# Patient Record
Sex: Female | Born: 1970 | Race: Black or African American | Hispanic: No | Marital: Married | Smoking: Never smoker
Health system: Southern US, Community
[De-identification: ages and names within clinical notes are randomized; demographics above are authoritative.]

## PROBLEM LIST (undated history)

## (undated) ENCOUNTER — Ambulatory Visit: Admission: EM | Payer: BC Managed Care – PPO | Source: Home / Self Care

## (undated) DIAGNOSIS — J45909 Unspecified asthma, uncomplicated: Secondary | ICD-10-CM

## (undated) DIAGNOSIS — E559 Vitamin D deficiency, unspecified: Secondary | ICD-10-CM

## (undated) DIAGNOSIS — G4730 Sleep apnea, unspecified: Secondary | ICD-10-CM

## (undated) DIAGNOSIS — M25473 Effusion, unspecified ankle: Secondary | ICD-10-CM

## (undated) DIAGNOSIS — N3010 Interstitial cystitis (chronic) without hematuria: Secondary | ICD-10-CM

## (undated) DIAGNOSIS — Z87448 Personal history of other diseases of urinary system: Secondary | ICD-10-CM

## (undated) DIAGNOSIS — R51 Headache: Secondary | ICD-10-CM

## (undated) DIAGNOSIS — B070 Plantar wart: Secondary | ICD-10-CM

## (undated) DIAGNOSIS — E288 Other ovarian dysfunction: Secondary | ICD-10-CM

## (undated) DIAGNOSIS — R011 Cardiac murmur, unspecified: Secondary | ICD-10-CM

## (undated) DIAGNOSIS — R350 Frequency of micturition: Secondary | ICD-10-CM

## (undated) DIAGNOSIS — E785 Hyperlipidemia, unspecified: Secondary | ICD-10-CM

## (undated) DIAGNOSIS — H15009 Unspecified scleritis, unspecified eye: Secondary | ICD-10-CM

## (undated) DIAGNOSIS — K648 Other hemorrhoids: Secondary | ICD-10-CM

## (undated) DIAGNOSIS — T7840XA Allergy, unspecified, initial encounter: Secondary | ICD-10-CM

## (undated) DIAGNOSIS — M25476 Effusion, unspecified foot: Secondary | ICD-10-CM

## (undated) DIAGNOSIS — I10 Essential (primary) hypertension: Secondary | ICD-10-CM

## (undated) DIAGNOSIS — E2839 Other primary ovarian failure: Secondary | ICD-10-CM

## (undated) DIAGNOSIS — R05 Cough: Secondary | ICD-10-CM

## (undated) DIAGNOSIS — G43909 Migraine, unspecified, not intractable, without status migrainosus: Secondary | ICD-10-CM

## (undated) DIAGNOSIS — Z9189 Other specified personal risk factors, not elsewhere classified: Secondary | ICD-10-CM

## (undated) HISTORY — DX: Effusion, unspecified ankle: M25.473

## (undated) HISTORY — DX: Unspecified asthma, uncomplicated: J45.909

## (undated) HISTORY — DX: Cough: R05

## (undated) HISTORY — PX: FRACTURE SURGERY: SHX138

## (undated) HISTORY — DX: Plantar wart: B07.0

## (undated) HISTORY — DX: Other ovarian dysfunction: E28.8

## (undated) HISTORY — DX: Other primary ovarian failure: E28.39

## (undated) HISTORY — DX: Other hemorrhoids: K64.8

## (undated) HISTORY — DX: Frequency of micturition: R35.0

## (undated) HISTORY — DX: Unspecified scleritis, unspecified eye: H15.009

## (undated) HISTORY — DX: Interstitial cystitis (chronic) without hematuria: N30.10

## (undated) HISTORY — DX: Personal history of other diseases of urinary system: Z87.448

## (undated) HISTORY — DX: Vitamin D deficiency, unspecified: E55.9

## (undated) HISTORY — DX: Effusion, unspecified foot: M25.476

## (undated) HISTORY — DX: Migraine, unspecified, not intractable, without status migrainosus: G43.909

## (undated) HISTORY — DX: Cardiac murmur, unspecified: R01.1

## (undated) HISTORY — DX: Other specified personal risk factors, not elsewhere classified: Z91.89

## (undated) HISTORY — DX: Headache: R51

## (undated) HISTORY — DX: Sleep apnea, unspecified: G47.30

## (undated) HISTORY — DX: Essential (primary) hypertension: I10

## (undated) HISTORY — PX: ROTATOR CUFF REPAIR: SHX139

## (undated) HISTORY — DX: Allergy, unspecified, initial encounter: T78.40XA

## (undated) HISTORY — DX: Hyperlipidemia, unspecified: E78.5

---

## 1999-10-06 ENCOUNTER — Other Ambulatory Visit: Admission: RE | Admit: 1999-10-06 | Discharge: 1999-10-06 | Payer: Self-pay | Admitting: Obstetrics and Gynecology

## 2000-05-05 ENCOUNTER — Encounter: Payer: Self-pay | Admitting: Obstetrics and Gynecology

## 2000-05-05 ENCOUNTER — Ambulatory Visit (HOSPITAL_COMMUNITY): Admission: RE | Admit: 2000-05-05 | Discharge: 2000-05-05 | Payer: Self-pay | Admitting: Obstetrics and Gynecology

## 2001-03-16 ENCOUNTER — Other Ambulatory Visit: Admission: RE | Admit: 2001-03-16 | Discharge: 2001-03-16 | Payer: Self-pay | Admitting: Internal Medicine

## 2002-02-21 ENCOUNTER — Other Ambulatory Visit: Admission: RE | Admit: 2002-02-21 | Discharge: 2002-02-21 | Payer: Self-pay | Admitting: Internal Medicine

## 2002-03-04 ENCOUNTER — Ambulatory Visit (HOSPITAL_COMMUNITY): Admission: RE | Admit: 2002-03-04 | Discharge: 2002-03-04 | Payer: Self-pay | Admitting: Gastroenterology

## 2002-03-04 ENCOUNTER — Encounter: Payer: Self-pay | Admitting: Gastroenterology

## 2002-09-25 ENCOUNTER — Encounter: Payer: Self-pay | Admitting: Internal Medicine

## 2002-09-25 ENCOUNTER — Encounter: Admission: RE | Admit: 2002-09-25 | Discharge: 2002-09-25 | Payer: Self-pay | Admitting: Internal Medicine

## 2003-06-23 ENCOUNTER — Ambulatory Visit (HOSPITAL_COMMUNITY): Admission: RE | Admit: 2003-06-23 | Discharge: 2003-06-23 | Payer: Self-pay | Admitting: Gastroenterology

## 2003-06-23 DIAGNOSIS — K648 Other hemorrhoids: Secondary | ICD-10-CM | POA: Insufficient documentation

## 2003-06-23 HISTORY — DX: Other hemorrhoids: K64.8

## 2003-10-31 ENCOUNTER — Encounter: Admission: RE | Admit: 2003-10-31 | Discharge: 2003-10-31 | Payer: Self-pay | Admitting: Internal Medicine

## 2005-02-17 ENCOUNTER — Other Ambulatory Visit: Admission: RE | Admit: 2005-02-17 | Discharge: 2005-02-17 | Payer: Self-pay | Admitting: Obstetrics and Gynecology

## 2006-07-04 ENCOUNTER — Other Ambulatory Visit: Admission: RE | Admit: 2006-07-04 | Discharge: 2006-07-04 | Payer: Self-pay | Admitting: Obstetrics and Gynecology

## 2007-04-26 ENCOUNTER — Encounter: Admission: RE | Admit: 2007-04-26 | Discharge: 2007-04-26 | Payer: Self-pay | Admitting: Obstetrics and Gynecology

## 2007-08-06 ENCOUNTER — Other Ambulatory Visit: Admission: RE | Admit: 2007-08-06 | Discharge: 2007-08-06 | Payer: Self-pay | Admitting: Obstetrics and Gynecology

## 2008-08-21 ENCOUNTER — Other Ambulatory Visit: Admission: RE | Admit: 2008-08-21 | Discharge: 2008-08-21 | Payer: Self-pay | Admitting: Obstetrics and Gynecology

## 2008-08-21 ENCOUNTER — Encounter: Payer: Self-pay | Admitting: Obstetrics and Gynecology

## 2008-08-21 ENCOUNTER — Ambulatory Visit: Payer: Self-pay | Admitting: Obstetrics and Gynecology

## 2008-08-26 ENCOUNTER — Ambulatory Visit: Payer: Self-pay | Admitting: Obstetrics and Gynecology

## 2008-09-15 ENCOUNTER — Ambulatory Visit: Payer: Self-pay | Admitting: Obstetrics and Gynecology

## 2009-01-22 ENCOUNTER — Ambulatory Visit (HOSPITAL_BASED_OUTPATIENT_CLINIC_OR_DEPARTMENT_OTHER): Admission: RE | Admit: 2009-01-22 | Discharge: 2009-01-22 | Payer: Self-pay | Admitting: Urology

## 2009-02-19 HISTORY — PX: CYSTOSCOPY: SUR368

## 2009-06-22 ENCOUNTER — Ambulatory Visit: Payer: Self-pay | Admitting: Internal Medicine

## 2009-06-22 DIAGNOSIS — M25473 Effusion, unspecified ankle: Secondary | ICD-10-CM | POA: Insufficient documentation

## 2009-06-22 DIAGNOSIS — H15009 Unspecified scleritis, unspecified eye: Secondary | ICD-10-CM

## 2009-06-22 DIAGNOSIS — R51 Headache: Secondary | ICD-10-CM | POA: Insufficient documentation

## 2009-06-22 DIAGNOSIS — I10 Essential (primary) hypertension: Secondary | ICD-10-CM | POA: Insufficient documentation

## 2009-06-22 DIAGNOSIS — R011 Cardiac murmur, unspecified: Secondary | ICD-10-CM

## 2009-06-22 DIAGNOSIS — Z87448 Personal history of other diseases of urinary system: Secondary | ICD-10-CM

## 2009-06-22 DIAGNOSIS — M25476 Effusion, unspecified foot: Secondary | ICD-10-CM

## 2009-06-22 DIAGNOSIS — G43909 Migraine, unspecified, not intractable, without status migrainosus: Secondary | ICD-10-CM

## 2009-06-22 DIAGNOSIS — N3010 Interstitial cystitis (chronic) without hematuria: Secondary | ICD-10-CM | POA: Insufficient documentation

## 2009-06-22 DIAGNOSIS — Z9189 Other specified personal risk factors, not elsewhere classified: Secondary | ICD-10-CM

## 2009-06-22 DIAGNOSIS — E559 Vitamin D deficiency, unspecified: Secondary | ICD-10-CM | POA: Insufficient documentation

## 2009-06-22 DIAGNOSIS — G43009 Migraine without aura, not intractable, without status migrainosus: Secondary | ICD-10-CM | POA: Insufficient documentation

## 2009-06-22 DIAGNOSIS — R350 Frequency of micturition: Secondary | ICD-10-CM | POA: Insufficient documentation

## 2009-06-22 DIAGNOSIS — R519 Headache, unspecified: Secondary | ICD-10-CM | POA: Insufficient documentation

## 2009-06-22 HISTORY — DX: Essential (primary) hypertension: I10

## 2009-06-22 HISTORY — DX: Vitamin D deficiency, unspecified: E55.9

## 2009-06-22 HISTORY — DX: Unspecified scleritis, unspecified eye: H15.009

## 2009-06-22 HISTORY — DX: Effusion, unspecified foot: M25.476

## 2009-06-22 HISTORY — DX: Cardiac murmur, unspecified: R01.1

## 2009-06-22 HISTORY — DX: Other specified personal risk factors, not elsewhere classified: Z91.89

## 2009-06-22 HISTORY — DX: Migraine, unspecified, not intractable, without status migrainosus: G43.909

## 2009-06-22 HISTORY — DX: Personal history of other diseases of urinary system: Z87.448

## 2009-06-22 HISTORY — DX: Headache: R51

## 2009-06-22 HISTORY — DX: Frequency of micturition: R35.0

## 2009-06-22 HISTORY — DX: Effusion, unspecified foot: M25.473

## 2009-06-22 LAB — CONVERTED CEMR LAB
ALT: 25 units/L (ref 0–35)
AST: 26 units/L (ref 0–37)
BUN: 11 mg/dL (ref 6–23)
Basophils Absolute: 0 10*3/uL (ref 0.0–0.1)
Bilirubin Urine: NEGATIVE
Bilirubin, Direct: 0.1 mg/dL (ref 0.0–0.3)
Calcium: 8.8 mg/dL (ref 8.4–10.5)
Creatinine, Ser: 0.8 mg/dL (ref 0.4–1.2)
Eosinophils Relative: 1.7 % (ref 0.0–5.0)
GFR calc non Af Amer: 102.23 mL/min (ref >60)
Lymphocytes Relative: 38.9 % (ref 12.0–46.0)
Monocytes Relative: 9.3 % (ref 3.0–12.0)
Neutrophils Relative %: 49.6 % (ref 43.0–77.0)
Nitrite: NEGATIVE
Pap Smear: NORMAL
Platelets: 318 10*3/uL (ref 150.0–400.0)
Potassium: 3.6 meq/L (ref 3.5–5.1)
RDW: 12.5 % (ref 11.5–14.6)
Rhuematoid fact SerPl-aCnc: <20 intl units/mL (ref 0.0–20.0)
Total Bilirubin: 0.7 mg/dL (ref 0.3–1.2)
Total Protein, Urine: NEGATIVE mg/dL
Urine Glucose: NEGATIVE mg/dL
WBC: 5 10*3/uL (ref 4.5–10.5)
pH: 6 (ref 5.0–8.0)

## 2009-06-23 ENCOUNTER — Encounter (INDEPENDENT_AMBULATORY_CARE_PROVIDER_SITE_OTHER): Payer: Self-pay | Admitting: *Deleted

## 2009-06-23 ENCOUNTER — Encounter: Payer: Self-pay | Admitting: Internal Medicine

## 2009-06-23 LAB — CONVERTED CEMR LAB: Anti Nuclear Antibody(ANA): NEGATIVE

## 2009-08-03 ENCOUNTER — Ambulatory Visit: Payer: Self-pay | Admitting: Internal Medicine

## 2009-08-03 DIAGNOSIS — B070 Plantar wart: Secondary | ICD-10-CM

## 2009-08-03 HISTORY — DX: Plantar wart: B07.0

## 2009-08-04 ENCOUNTER — Telehealth: Payer: Self-pay | Admitting: Internal Medicine

## 2009-08-28 ENCOUNTER — Encounter: Payer: Self-pay | Admitting: Obstetrics and Gynecology

## 2009-08-28 ENCOUNTER — Other Ambulatory Visit: Admission: RE | Admit: 2009-08-28 | Discharge: 2009-08-28 | Payer: Self-pay | Admitting: Obstetrics and Gynecology

## 2009-08-28 ENCOUNTER — Ambulatory Visit: Payer: Self-pay | Admitting: Obstetrics and Gynecology

## 2009-09-17 ENCOUNTER — Ambulatory Visit: Payer: Self-pay | Admitting: Internal Medicine

## 2009-09-17 ENCOUNTER — Encounter: Payer: Self-pay | Admitting: Internal Medicine

## 2009-09-17 DIAGNOSIS — R059 Cough, unspecified: Secondary | ICD-10-CM

## 2009-09-17 DIAGNOSIS — R05 Cough: Secondary | ICD-10-CM

## 2009-09-17 DIAGNOSIS — J069 Acute upper respiratory infection, unspecified: Secondary | ICD-10-CM | POA: Insufficient documentation

## 2009-09-17 HISTORY — DX: Cough, unspecified: R05.9

## 2010-03-31 ENCOUNTER — Ambulatory Visit: Payer: Self-pay | Admitting: Obstetrics and Gynecology

## 2010-07-15 ENCOUNTER — Encounter: Admission: RE | Admit: 2010-07-15 | Discharge: 2010-07-15 | Payer: Self-pay | Admitting: Obstetrics and Gynecology

## 2010-07-20 ENCOUNTER — Encounter: Admission: RE | Admit: 2010-07-20 | Discharge: 2010-07-20 | Payer: Self-pay | Admitting: Obstetrics and Gynecology

## 2010-08-30 ENCOUNTER — Other Ambulatory Visit: Admission: RE | Admit: 2010-08-30 | Discharge: 2010-08-30 | Payer: Self-pay | Admitting: Obstetrics and Gynecology

## 2010-08-30 ENCOUNTER — Ambulatory Visit: Payer: Self-pay | Admitting: Obstetrics and Gynecology

## 2010-09-09 ENCOUNTER — Ambulatory Visit: Payer: Self-pay | Admitting: Obstetrics and Gynecology

## 2011-04-05 NOTE — Op Note (Signed)
NAMESIANNE, Kara Cannon             ACCOUNT NO.:  [id number]   MEDICAL RECORD NO.:  [id number]          PATIENT TYPE:  AMB   LOCATION:  [facility]                         FACILITY:  [facility]   PHYSICIAN:  Excell Seltzer. Annabell Howells, M.D.    DATE OF BIRTH:  08-12-1971   DATE OF PROCEDURE:  01/22/2009  DATE OF DISCHARGE:                               OPERATIVE REPORT   PROCEDURES:  1. Cystoscopy.  2. Bilateral retrograde pyelograms with interpretation.  3. Hydrodistention of the bladder.  4. Urethral dilation.  5. Installation of Pyridium and Marcaine.   PREOPERATIVE DIAGNOSIS:  Small capacity hypersensitive bladder with  microhematuria rule out interstitial cystitis.   POSTOPERATIVE DIAGNOSIS:  Interstitial cystitis.   SURGEON:  Dr. Bjorn Pippin.   ANESTHESIA:  General.   SPECIMEN:  None.   COMPLICATIONS:  None.   INDICATIONS:  Kara Cannon is a 40 year old African American female with a  long history of urinary frequency with severe nocturia that has failed  to respond to anticholinergics.  She has had some urgency, but no  incontinence.  Urodynamics were performed which demonstrated small  capacity hypersensitive bladder with reduced compliance and some  external sphincter dyssynergia.  She had microhematuria with a negative  cytology.  It was felt the cystoscopy, hydrodistention, bilateral  retrogrades, urethral dilation and installation of Pyridium and Marcaine  were indicated.   FINDINGS AND PROCEDURE:  The patient was taken to the operating room  where, after receiving Cipro, a general anesthetic was induced.  She was  placed in lithotomy position.  Her perineum and genitalia were prepped  with Betadine solution.  She was draped in the usual sterile fashion.  Time-out was performed.  Cystoscopy was performed using a 22-French  scope and 12 and 70-degree lenses.  Examination revealed a normal  urethra.  The bladder wall had slight increased vascularity particularly  in the trigone.  No tumors or  stones were noted.  No inflammatory  changes were noted.  There was minimal trabeculation.  Ureteral orifices  were unremarkable, effluxing clear urine.   A 5-French open-end catheter and contrast was used to perform retrograde  pyelography bilaterally.   The right retrograde pyelogram revealed a normal ureter and internal  collecting system.   The left retrograde pyelogram revealed a normal ureter and internal  collecting systems.   After completion of retrograde pyelograms, the bladder was filled to  capacity under 80 cm water pressure, held for 3 minutes and drained.  Upon draining, she had was noted at capacity of 500 mL with bloody  terminal efflux.  Repeat cystoscopy demonstrated diffuse glomerulations  consistent with a diagnosis of interstitial cystitis.   The urethra was then calibrated to 32-French with female sounds.  There  was some splitting of the mucosa at the meatus.  The bladder was then  instilled with 30 mL of 0.25% Marcaine with 400 mg of Pyridium.  She was  given a B and O suppository, taken down from lithotomy position.  Her  anesthetic was reversed.  She was moved to the recovery room in stable  condition.  There were no complications.  Excell Seltzer. Annabell Howells, M.D.  Electronically Signed     JJW/MEDQ  D:  01/22/2009  T:  01/22/2009  Job:  [id number]   cc:   Reuel Boom L. Eda Paschal, M.D.  Fax: [phone]

## 2011-04-08 NOTE — Op Note (Signed)
   Kara Cannon, Kara Cannon                       ACCOUNT NO.:  [id number]   MEDICAL RECORD NO.:  [id number]                   PATIENT TYPE:  AMB   LOCATION:  ENDO                                 FACILITY:  [facility]   PHYSICIAN:  Anselmo Rod, M.D.               DATE OF BIRTH:  04/20/1971   DATE OF PROCEDURE:  06/23/2003  DATE OF DISCHARGE:                                 OPERATIVE REPORT   PROCEDURE PERFORMED:  Colonoscopy.   ENDOSCOPIST:  Charna Elizabeth, M.D.   INSTRUMENT USED:  Olympus video colonoscope.   INDICATIONS FOR PROCEDURE:  Rectal bleeding in a 40 year old female with a  family history of ovarian cancer. Rule out colonic polyps, masses, etc.   PREPROCEDURE PREPARATION:  Informed consent was procured from the patient.  The patient was fasted for eight hours prior to the procedure and prepped  with a bottle of magnesium citrate and a gallon of GoLYTELY the night prior  to the procedure.   PREPROCEDURE PHYSICAL:  The patient had stable vital signs.  Neck supple.  Chest clear to auscultation.  S1 and S2 regular.  Abdomen soft with normal  bowel sounds.   DESCRIPTION OF PROCEDURE:  The patient was placed in left lateral decubitus  position and sedated with 70 mg of Demerol and 7 mg of Versed intravenously.  Once the patient was adequately sedated and maintained on low flow oxygen  and continuous cardiac monitoring, the Olympus video colonoscope was  advanced from the rectum to the cecum and terminal ileum without difficulty.  The appendicular orifice and ileocecal valve were clearly visualized and  photographed.  Small internal hemorrhoids were seen on retroflexion in the  rectum.  No masses, polyps, erosions, ulcerations or diverticula were  appreciated.   IMPRESSION:  Normal colonoscopy up to the terminal ileum except for small  internal hemorrhoids.   RECOMMENDATIONS:  1. A high fiber diet with liberal fluid intake has been recommended as I     suspect the rectal  bleeding is secondary to internal hemorrhoids.  2. Repeat colorectal cancer screening is recommended at the age of 20 unless     the patient develops any abnormal symptoms in the interim.  3. Outpatient follow-up as need arises in the future.                                                    Anselmo Rod, M.D.    JNM/MEDQ  D:  06/23/2003  T:  06/23/2003  Job:  [id number]   cc:   Candyce Churn. Allyne Gee, M.D.  [address]  [address]  [place]  [place] [address]  Fax: [phone]

## 2011-07-08 ENCOUNTER — Encounter: Payer: Self-pay | Admitting: Internal Medicine

## 2011-07-08 DIAGNOSIS — N3010 Interstitial cystitis (chronic) without hematuria: Secondary | ICD-10-CM

## 2011-08-01 ENCOUNTER — Other Ambulatory Visit: Payer: Self-pay | Admitting: *Deleted

## 2011-08-01 DIAGNOSIS — N6019 Diffuse cystic mastopathy of unspecified breast: Secondary | ICD-10-CM

## 2011-08-04 ENCOUNTER — Other Ambulatory Visit: Payer: Self-pay | Admitting: Obstetrics and Gynecology

## 2011-08-04 DIAGNOSIS — N6019 Diffuse cystic mastopathy of unspecified breast: Secondary | ICD-10-CM

## 2011-08-19 ENCOUNTER — Ambulatory Visit: Admission: RE | Admit: 2011-08-19 | Payer: Self-pay | Source: Ambulatory Visit

## 2011-08-19 ENCOUNTER — Ambulatory Visit
Admission: RE | Admit: 2011-08-19 | Discharge: 2011-08-19 | Disposition: A | Discharge status: Home / Self Care | Payer: BC Managed Care – PPO | Source: Ambulatory Visit | Attending: Obstetrics and Gynecology | Admitting: Obstetrics and Gynecology

## 2011-08-19 ENCOUNTER — Ambulatory Visit: Payer: Self-pay

## 2011-08-19 DIAGNOSIS — N6019 Diffuse cystic mastopathy of unspecified breast: Secondary | ICD-10-CM

## 2011-10-12 ENCOUNTER — Encounter: Payer: Self-pay | Admitting: Gynecology

## 2011-10-12 DIAGNOSIS — N3010 Interstitial cystitis (chronic) without hematuria: Secondary | ICD-10-CM | POA: Insufficient documentation

## 2011-10-12 DIAGNOSIS — E2839 Other primary ovarian failure: Secondary | ICD-10-CM | POA: Insufficient documentation

## 2011-10-24 ENCOUNTER — Encounter: Payer: Self-pay | Admitting: Obstetrics and Gynecology

## 2011-10-24 ENCOUNTER — Ambulatory Visit (INDEPENDENT_AMBULATORY_CARE_PROVIDER_SITE_OTHER): Payer: BC Managed Care – PPO | Admitting: Obstetrics and Gynecology

## 2011-10-24 ENCOUNTER — Other Ambulatory Visit (HOSPITAL_COMMUNITY)
Admission: RE | Admit: 2011-10-24 | Discharge: 2011-10-24 | Disposition: A | Discharge status: Home / Self Care | Payer: BC Managed Care – PPO | Source: Ambulatory Visit | Attending: Obstetrics and Gynecology | Admitting: Obstetrics and Gynecology

## 2011-10-24 VITALS — BP 124/76 | Ht 66.5 in | Wt 226.0 lb

## 2011-10-24 DIAGNOSIS — Z01419 Encounter for gynecological examination (general) (routine) without abnormal findings: Secondary | ICD-10-CM | POA: Insufficient documentation

## 2011-10-24 DIAGNOSIS — N390 Urinary tract infection, site not specified: Secondary | ICD-10-CM

## 2011-10-24 DIAGNOSIS — R823 Hemoglobinuria: Secondary | ICD-10-CM

## 2011-10-24 NOTE — Progress Notes (Signed)
Addended byCammie Mcgee T on: 10/24/2011 09:41 AM   Modules accepted: Orders

## 2011-10-24 NOTE — Progress Notes (Signed)
Patient came to see me today for her annual GYN exam. 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 She has night sweats but not enough to take medication. She has had 2 bone densities showing minimal bone loss. She has had no fractures. She is having no pelvic pain. She has had no vaginal bleeding. She does her lab work through her PCP. She has noticed increasing fluid retention and her lower extremitys especially her left one. She is having trouble with weight gain. She has some lesions on her right inner thigh that her dermatolgist has given her some cream which has not worked.  Physical examination:  Kennon Portela present. HEENT within normal limits. Neck: Thyroid not large. No masses. Supraclavicular nodes: not enlarged. Breasts: Examined in both sitting midline position. No skin changes and no masses. Abdomen: Soft no guarding rebound or masses or hernia. Pelvic: External: Within normal limits. BUS: Within normal limits. Vaginal:within normal limits. Good estrogen effect. No evidence of cystocele rectocele or enterocele. Cervix: clean. Uterus: Normal size and shape. Adnexa: No masses. Rectovaginal exam: Confirmatory and negative. Extremities: Within normal limits.  Skin: On right inner thigh there is a black mole and another area which is raised with dark pigmentation.  Assessment: #1. Premature ovarian failure #2. Venous insufficiency lower extremities #3. Weight gain #4. new Skin lesion  Plan: Continue yearly mammograms. Referred to Dr. Hart Rochester for veins. Discussed increasing exercise and diet for weight. Patient to check with PCP to be sure a TSH was checked. Referred back to dermatologists for reassessment of skin lesions.

## 2011-10-25 MED ORDER — NITROFURANTOIN MONOHYD MACRO 100 MG PO CAPS: 100.0000 mg | ORAL_CAPSULE | Freq: Two times a day (BID) | ORAL | Status: AC

## 2011-10-25 NOTE — Progress Notes (Signed)
Addended by: Venora Maples on: 10/25/2011 11:54 AM   Modules accepted: Orders

## 2012-09-03 ENCOUNTER — Other Ambulatory Visit: Payer: Self-pay | Admitting: *Deleted

## 2012-09-03 DIAGNOSIS — N63 Unspecified lump in unspecified breast: Secondary | ICD-10-CM

## 2012-09-05 ENCOUNTER — Ambulatory Visit
Admission: RE | Admit: 2012-09-05 | Discharge: 2012-09-05 | Disposition: A | Discharge status: Home / Self Care | Payer: BC Managed Care – PPO | Source: Ambulatory Visit | Attending: Obstetrics and Gynecology | Admitting: Obstetrics and Gynecology

## 2012-09-05 DIAGNOSIS — N63 Unspecified lump in unspecified breast: Secondary | ICD-10-CM

## 2012-11-26 ENCOUNTER — Ambulatory Visit (INDEPENDENT_AMBULATORY_CARE_PROVIDER_SITE_OTHER): Payer: BC Managed Care – PPO | Admitting: Family Medicine

## 2012-11-26 VITALS — BP 146/86 | HR 147 | Temp 103.2°F | Resp 18 | Ht 66.5 in | Wt 229.0 lb

## 2012-11-26 DIAGNOSIS — J392 Other diseases of pharynx: Secondary | ICD-10-CM

## 2012-11-26 DIAGNOSIS — J111 Influenza due to unidentified influenza virus with other respiratory manifestations: Secondary | ICD-10-CM

## 2012-11-26 DIAGNOSIS — J101 Influenza due to other identified influenza virus with other respiratory manifestations: Secondary | ICD-10-CM

## 2012-11-26 MED ORDER — OSELTAMIVIR PHOSPHATE 75 MG PO CAPS: 75.0000 mg | ORAL_CAPSULE | Freq: Two times a day (BID) | ORAL | Status: DC

## 2012-11-26 MED ORDER — HYDROCODONE-HOMATROPINE 5-1.5 MG/5ML PO SYRP: 5.0000 mL | ORAL_SOLUTION | ORAL | Status: DC | PRN

## 2012-11-26 MED ORDER — ACETAMINOPHEN 325 MG PO TABS
500.0000 mg | ORAL_TABLET | Freq: Four times a day (QID) | ORAL | Status: DC | PRN
  Administered 2012-11-26: 1000 mg via ORAL

## 2012-11-26 NOTE — Patient Instructions (Signed)

## 2012-11-26 NOTE — Progress Notes (Signed)
Subjective: 42 year old lady who's husband was diagnosed with influenza yesterday. She started feeling achy and a headache yesterday. During the night she spiked a high fever to 103.5 and felt lousy. She is coughing. Mild sneezing and runny nose. Fever despite taking Tylenol. Her throat is sore.  Objective: Her TMs are normal. Throat is a little red has a ulceration on the right tonsillar area. Neck was supple without significant nodes. Chest is clear to auscultation. Heart is tachycardic with a soft flow murmur.  Assessment: Influenza Baral also right tonsil  Plan: Fluids, rest, Tamiflu, cough medications, ibuprofen or Tylenol

## 2013-03-22 ENCOUNTER — Ambulatory Visit (INDEPENDENT_AMBULATORY_CARE_PROVIDER_SITE_OTHER): Payer: BC Managed Care – PPO | Admitting: Internal Medicine

## 2013-03-22 ENCOUNTER — Encounter: Payer: Self-pay | Admitting: Internal Medicine

## 2013-03-22 VITALS — BP 162/118 | HR 80 | Temp 98.2°F | Ht 66.25 in | Wt 227.0 lb

## 2013-03-22 DIAGNOSIS — I10 Essential (primary) hypertension: Secondary | ICD-10-CM

## 2013-03-22 DIAGNOSIS — F411 Generalized anxiety disorder: Secondary | ICD-10-CM | POA: Insufficient documentation

## 2013-03-22 DIAGNOSIS — E2839 Other primary ovarian failure: Secondary | ICD-10-CM

## 2013-03-22 DIAGNOSIS — F430 Acute stress reaction: Secondary | ICD-10-CM | POA: Insufficient documentation

## 2013-03-22 DIAGNOSIS — E288 Other ovarian dysfunction: Secondary | ICD-10-CM

## 2013-03-22 MED ORDER — HYDROCHLOROTHIAZIDE 12.5 MG PO TABS: 12.5000 mg | ORAL_TABLET | Freq: Every day | ORAL | Status: DC

## 2013-03-22 MED ORDER — POTASSIUM CHLORIDE ER 10 MEQ PO TBCR: 10.0000 meq | EXTENDED_RELEASE_TABLET | Freq: Two times a day (BID) | ORAL | Status: DC

## 2013-03-22 NOTE — Assessment & Plan Note (Signed)
Patient reports difficulty with stressful work situation.  She denies any previous history of anxiety or depression. We discussed stress management techniques. If worsening symptoms, consider use of antidepressants or anxiolytics.

## 2013-03-22 NOTE — Assessment & Plan Note (Signed)
Patient advised to discontinue over-the-counter weight-loss supplement as it may be exacerbating her blood pressure. She complains of fluid retention. Start hydrochlorothiazide 12.5 mg once daily. Patient advised to increase her intake of high potassium foods. Patient also encouraged to limit her sodium intake. BP: 162/118 mmHg

## 2013-03-22 NOTE — Progress Notes (Signed)
Subjective:    Patient ID: Kara Cannon, female    DOB: 1970-12-06, 42 y.o.   MRN: [id number]  HPI  42 year old [place] American female with history of hypertension, hyperlipidemia and interstitial status to establish.  Patient states her blood pressure has been labile and she has not taken antihypertensives in a regular basis. Over the last several months she has noticed elevated blood pressure readings. Patient also concerned about inability to lose weight and fluid retention. She has been taking a over-the-counter weight loss supplement - Garcinia Cambogia.  She has started a weight loss parameters. She is enrolled in Museum/gallery curator". She has been enrolled for last 6 weeks. She is also altered her diet. She's lost approximately 5 pounds within the last month.  She has history of premature ovarian failure.  She is not on HRT.  She also reports possible history of hypothyroidism. She was on thyroid replacement but stopped because it caused weight gain. It has been greater than one year since her thyroid blood test. Review of Systems  Constitutional: Negative for activity change, appetite change and unexpected weight change.  Eyes: Negative for visual disturbance.  Respiratory: Negative for cough, chest tightness and shortness of breath.   Cardiovascular: Negative for chest pain.  Genitourinary: Negative for difficulty urinating.  Neurological: Negative for headaches.  Gastrointestinal: Negative for abdominal pain, heartburn melena or hematochezia Psych: stressed at work,  She feels stress contributing to weight problem Endo:  No polyuria or polydypsia        Past Medical History  Diagnosis Date  . Effusion of ankle and foot joint 06/22/2009  . CHICKENPOX, HX OF 06/22/2009  . Cough 09/17/2009  . EPISCLERITIS 06/22/2009  . Headache 06/22/2009  . HEMORRHOIDS, INTERNAL 06/23/2003  . HYPERTENSION 06/22/2009  . MIGRAINE HEADACHE 06/22/2009  . PLANTAR WART, LEFT 08/03/2009  . Unspecified vitamin D  deficiency 06/22/2009  . Urinary frequency 06/22/2009  . UTI'S, HX OF 06/22/2009  . Premature ovarian failure   . IC (interstitial cystitis)   . CARDIAC MURMUR 06/22/2009    Mitral regurgitation    History   Social History  . Marital Status: Married    Spouse Name: N/A    Number of Children: N/A  . Years of Education: N/A   Occupational History  . New Business Associate     [employer]   Social History Main Topics  . Smoking status: Never Smoker   . Smokeless tobacco: Not on file     Comment: Married  . Alcohol Use: No  . Drug Use: No  . Sexually Active: Yes    Birth Control/ Protection: Post-menopausal   Other Topics Concern  . Not on file   Social History Narrative  . No narrative on file    Past Surgical History  Procedure Laterality Date  . Cystoscopy  02/2009    Dr. Annabell Howells    Family History  Problem Relation Age of Onset  . Skin cancer Maternal Uncle   . Hypertension Mother   . Hypertension Father   . Ovarian cancer Paternal Grandmother   . Cancer Paternal Grandmother   . Diabetes Maternal Grandmother   . Hyperlipidemia Maternal Grandmother     Allergies  Allergen Reactions  . Corn-Containing Products   . Ibuprofen   . Naproxen Sodium   . Peanut-Containing Drug Products   . Penicillins   . Sulfonamide Derivatives     Current Outpatient Prescriptions on File Prior to Visit  Medication Sig Dispense Refill  . Ascorbic Acid (VITAMIN  C PO) Take by mouth.        Marland Kitchen BIOTIN PO Take by mouth.        . Cholecalciferol (VITAMIN D) 2000 UNITS CAPS Take by mouth daily.        . Cyanocobalamin (VITAMIN B-12 PO) Take by mouth.        . Multiple Vitamin (MULTIVITAMIN) tablet Take 1 tablet by mouth daily.         No current facility-administered medications on file prior to visit.    BP 162/118  Pulse 80  Temp(Src) 98.2 F (36.8 C) (Oral)  Ht 5' 6.25" (1.683 m)  Wt 227 lb (102.967 kg)  BMI 36.35 kg/m2    Objective:   Physical Exam  Constitutional:  She is oriented to person, place, and time. She appears well-developed and well-nourished. No distress.  HENT:  Head: Normocephalic and atraumatic.  Right Ear: External ear normal.  Left Ear: External ear normal.  Eyes: Conjunctivae and EOM are normal. Pupils are equal, round, and reactive to light.  Neck: Neck supple.  Negative for carotid bruit  Cardiovascular: Normal rate, regular rhythm and normal heart sounds.   No murmur heard. Pulmonary/Chest: Effort normal and breath sounds normal. She has no wheezes.  Abdominal: Soft. Bowel sounds are normal. She exhibits no mass. There is no tenderness.  Musculoskeletal:  Trace lower extremity edema bilaterally  Lymphadenopathy:    She has no cervical adenopathy.  Neurological: She is alert and oriented to person, place, and time. She displays normal reflexes. No cranial nerve deficit. She exhibits normal muscle tone.  Muscle strength is 5 out of 5 throughout  Skin: Skin is warm and dry.  Psychiatric: She has a normal mood and affect. Her behavior is normal.          Assessment & Plan:

## 2013-03-22 NOTE — Assessment & Plan Note (Signed)
Check estradiol level, FSH and LH

## 2013-03-22 NOTE — Patient Instructions (Addendum)
Please complete the following lab tests before your next follow up appointment: BMET, CBCD, FLP, LFTs - 401.9, 272.4 TSH, Free T4 - 244.9 Estradiol levels, FSH, LH - 256.8 A1c - 790.29

## 2013-03-28 ENCOUNTER — Telehealth: Payer: Self-pay | Admitting: Internal Medicine

## 2013-03-28 NOTE — Telephone Encounter (Signed)
Ok to add blood test

## 2013-03-28 NOTE — Telephone Encounter (Signed)
Lab added

## 2013-03-28 NOTE — Telephone Encounter (Signed)
Pt is sch for blood work tomorrow and would like to add HS-CRP test to order

## 2013-03-29 ENCOUNTER — Other Ambulatory Visit (INDEPENDENT_AMBULATORY_CARE_PROVIDER_SITE_OTHER): Payer: BC Managed Care – PPO

## 2013-03-29 DIAGNOSIS — E288 Other ovarian dysfunction: Secondary | ICD-10-CM

## 2013-03-29 DIAGNOSIS — E039 Hypothyroidism, unspecified: Secondary | ICD-10-CM

## 2013-03-29 DIAGNOSIS — I10 Essential (primary) hypertension: Secondary | ICD-10-CM

## 2013-03-29 DIAGNOSIS — E785 Hyperlipidemia, unspecified: Secondary | ICD-10-CM

## 2013-03-29 LAB — CBC WITH DIFFERENTIAL/PLATELET
Basophils Absolute: 0 10*3/uL (ref 0.0–0.1)
Eosinophils Absolute: 0.1 10*3/uL (ref 0.0–0.7)
Hemoglobin: 13.2 g/dL (ref 12.0–15.0)
Lymphocytes Relative: 37.3 % (ref 12.0–46.0)
Lymphs Abs: 2.2 10*3/uL (ref 0.7–4.0)
MCHC: 34.1 g/dL (ref 30.0–36.0)
Neutro Abs: 3 10*3/uL (ref 1.4–7.7)
Platelets: 361 10*3/uL (ref 150.0–400.0)
RDW: 14.8 % — ABNORMAL HIGH (ref 11.5–14.6)

## 2013-03-29 LAB — BASIC METABOLIC PANEL
BUN: 14 mg/dL (ref 6–23)
CO2: 26 mEq/L (ref 19–32)
Calcium: 9.3 mg/dL (ref 8.4–10.5)
GFR: 94.88 mL/min (ref >60.00)
Glucose, Bld: 90 mg/dL (ref 70–99)
Sodium: 138 mEq/L (ref 135–145)

## 2013-03-29 LAB — HEPATIC FUNCTION PANEL
Albumin: 4.1 g/dL (ref 3.5–5.2)
Total Bilirubin: 0.7 mg/dL (ref 0.3–1.2)

## 2013-03-29 LAB — LIPID PANEL
HDL: 46.3 mg/dL (ref >39.00)
Triglycerides: 115 mg/dL (ref 0.0–149.0)

## 2013-03-29 LAB — FOLLICLE STIMULATING HORMONE: FSH: 54.2 m[IU]/mL

## 2013-03-29 LAB — T4, FREE: Free T4: 0.95 ng/dL (ref 0.60–1.60)

## 2013-03-29 LAB — HEMOGLOBIN A1C: Hgb A1c MFr Bld: 6.3 % (ref 4.6–6.5)

## 2013-03-29 LAB — LUTEINIZING HORMONE: LH: 26.86 m[IU]/mL

## 2013-03-30 LAB — ESTRADIOL: Estradiol: 26.6 pg/mL

## 2013-04-05 ENCOUNTER — Encounter: Payer: Self-pay | Admitting: Internal Medicine

## 2013-04-05 ENCOUNTER — Ambulatory Visit (INDEPENDENT_AMBULATORY_CARE_PROVIDER_SITE_OTHER): Payer: BC Managed Care – PPO | Admitting: Internal Medicine

## 2013-04-05 VITALS — BP 130/84 | HR 80 | Temp 98.4°F | Wt 221.0 lb

## 2013-04-05 DIAGNOSIS — R7309 Other abnormal glucose: Secondary | ICD-10-CM | POA: Insufficient documentation

## 2013-04-05 DIAGNOSIS — I10 Essential (primary) hypertension: Secondary | ICD-10-CM

## 2013-04-05 DIAGNOSIS — E785 Hyperlipidemia, unspecified: Secondary | ICD-10-CM

## 2013-04-05 DIAGNOSIS — E782 Mixed hyperlipidemia: Secondary | ICD-10-CM | POA: Insufficient documentation

## 2013-04-05 MED ORDER — PRAVASTATIN SODIUM 20 MG PO TABS: 20.0000 mg | ORAL_TABLET | Freq: Every day | ORAL | Status: DC

## 2013-04-05 MED ORDER — HYDROCHLOROTHIAZIDE 12.5 MG PO TABS: 12.5000 mg | ORAL_TABLET | Freq: Every day | ORAL | Status: DC

## 2013-04-05 MED ORDER — CLOTRIMAZOLE-BETAMETHASONE 1-0.05 % EX CREA: TOPICAL_CREAM | Freq: Two times a day (BID) | CUTANEOUS | Status: DC

## 2013-04-05 NOTE — Assessment & Plan Note (Signed)
Good response to hydrochlorothiazide. Continue same dose. BP: 130/84 mmHg  Lab Results  Component Value Date   CREATININE 0.8 03/29/2013   Lab Results  Component Value Date   NA 138 03/29/2013   K 3.9 03/29/2013   CL 102 03/29/2013   CO2 26 03/29/2013

## 2013-04-05 NOTE — Patient Instructions (Addendum)
Limit your carbohydrate intake to 30 grams per meal ( 90 - 100 grams per day ) Please complete the following lab tests before your next follow up appointment: BMET, A1c - 790.29 FLP, LFTs - 272.4

## 2013-04-05 NOTE — Assessment & Plan Note (Signed)
Patient is prediabetic. I stressed importance of life style/dietary changes. Patient to limit her carbohydrate intake to 30 g per meal. Continue regular exercise. Educational material provided.

## 2013-04-05 NOTE — Progress Notes (Signed)
Subjective:    Patient ID: Kara Cannon, female    DOB: 1971/02/02, 41 y.o.   MRN: [id number]  HPI  42 year old [place] American female previously seen for hypertension, hyperlipidemia and premature in failure for followup. She was started on hydrochlorothiazide. She is tolerating this well. Her blood pressure has improved.  We reviewed her blood work in detail. She is borderline diabetic. We reviewed her current diet.  Estradiol, LH, FSH consistent with postmenopausal state.  Her CRP is elevated.  Review of Systems Negative for chest pain or shortness of breath    Past Medical History  Diagnosis Date  . Effusion of ankle and foot joint 06/22/2009  . CHICKENPOX, HX OF 06/22/2009  . Cough 09/17/2009  . EPISCLERITIS 06/22/2009  . Headache 06/22/2009  . HEMORRHOIDS, INTERNAL 06/23/2003  . HYPERTENSION 06/22/2009  . MIGRAINE HEADACHE 06/22/2009  . PLANTAR WART, LEFT 08/03/2009  . Unspecified vitamin D deficiency 06/22/2009  . Urinary frequency 06/22/2009  . UTI'S, HX OF 06/22/2009  . Premature ovarian failure   . IC (interstitial cystitis)   . CARDIAC MURMUR 06/22/2009    Mitral regurgitation    History   Social History  . Marital Status: Married    Spouse Name: N/A    Number of Children: N/A  . Years of Education: N/A   Occupational History  . New Business Associate     [employer]   Social History Main Topics  . Smoking status: Never Smoker   . Smokeless tobacco: Not on file     Comment: Married  . Alcohol Use: No  . Drug Use: No  . Sexually Active: Yes    Birth Control/ Protection: Post-menopausal   Other Topics Concern  . Not on file   Social History Narrative  . No narrative on file    Past Surgical History  Procedure Laterality Date  . Cystoscopy  02/2009    Dr. Annabell Howells    Family History  Problem Relation Age of Onset  . Skin cancer Maternal Uncle   . Hypertension Mother   . Hypertension Father   . Ovarian cancer Paternal Grandmother   . Cancer Paternal  Grandmother   . Diabetes Maternal Grandmother   . Hyperlipidemia Maternal Grandmother     Allergies  Allergen Reactions  . Corn-Containing Products   . Ibuprofen   . Naproxen Sodium   . Peanut-Containing Drug Products   . Penicillins   . Sulfonamide Derivatives     Current Outpatient Prescriptions on File Prior to Visit  Medication Sig Dispense Refill  . Ascorbic Acid (VITAMIN C PO) Take by mouth.        Marland Kitchen aspirin-acetaminophen-caffeine (EXCEDRIN MIGRAINE) 250-250-65 MG per tablet Take 1 tablet by mouth every 6 (six) hours as needed for pain.      Marland Kitchen b complex vitamins tablet Take 1 tablet by mouth daily.      Marland Kitchen BIOTIN PO Take by mouth.        . Cholecalciferol (VITAMIN D) 2000 UNITS CAPS Take by mouth daily.        . Cyanocobalamin (VITAMIN B-12 PO) Take by mouth.        . Multiple Vitamin (MULTIVITAMIN) tablet Take 1 tablet by mouth daily.        . potassium chloride (K-DUR) 10 MEQ tablet Take 1 tablet (10 mEq total) by mouth 2 (two) times daily.  30 tablet  1  . TURMERIC CURCUMIN PO Take 1 tablet by mouth daily.       No current  facility-administered medications on file prior to visit.    BP 130/84  Pulse 80  Temp(Src) 98.4 F (36.9 C) (Oral)  Wt 221 lb (100.245 kg)  BMI 35.39 kg/m2    Objective:   Physical Exam  Constitutional: She is oriented to person, place, and time. She appears well-developed and well-nourished.  Cardiovascular: Normal rate, regular rhythm and normal heart sounds.   Pulmonary/Chest: Effort normal and breath sounds normal. She has no wheezes.  Neurological: She is alert and oriented to person, place, and time.  Skin:  Dry patch right upper mid thigh          Assessment & Plan:

## 2013-04-05 NOTE — Assessment & Plan Note (Signed)
Start pravastatin 20 once daily considering hypertension and prediabetes. LFTs and fasting lipid panel before next office visit. Lab Results  Component Value Date   CHOL 225* 03/29/2013   HDL 46.30 03/29/2013   LDLDIRECT 159.7 03/29/2013   TRIG 115.0 03/29/2013   CHOLHDL 5 03/29/2013

## 2013-04-08 ENCOUNTER — Telehealth: Payer: Self-pay | Admitting: Internal Medicine

## 2013-04-08 DIAGNOSIS — R7303 Prediabetes: Secondary | ICD-10-CM

## 2013-04-08 NOTE — Telephone Encounter (Signed)
Referral order placed.

## 2013-04-08 NOTE — Telephone Encounter (Signed)
Ok for referral to nutritionist re: prediabetes.

## 2013-04-08 NOTE — Telephone Encounter (Signed)
PT called to request information about a nutritionist. She would like to also be referred to one, please assist.

## 2013-04-17 ENCOUNTER — Encounter: Payer: BC Managed Care – PPO | Attending: Internal Medicine | Admitting: *Deleted

## 2013-04-17 ENCOUNTER — Encounter: Payer: Self-pay | Admitting: *Deleted

## 2013-04-17 DIAGNOSIS — E785 Hyperlipidemia, unspecified: Secondary | ICD-10-CM

## 2013-04-17 DIAGNOSIS — E669 Obesity, unspecified: Secondary | ICD-10-CM | POA: Insufficient documentation

## 2013-04-17 DIAGNOSIS — I10 Essential (primary) hypertension: Secondary | ICD-10-CM

## 2013-04-17 DIAGNOSIS — R7309 Other abnormal glucose: Secondary | ICD-10-CM

## 2013-04-17 DIAGNOSIS — Z713 Dietary counseling and surveillance: Secondary | ICD-10-CM | POA: Insufficient documentation

## 2013-04-17 NOTE — Progress Notes (Signed)
  Medical Nutrition Therapy:  Appt start time: 1130 end time:  1230.  Assessment:  Primary concerns today: Kara Cannon is here for nutrition counseling pertaining to obesity and prediabetes.  She complains of fluid retention for 10-15 years. She has tried to lose weight in the past vie dieting and walking.  Also has elliptical machine, but she doesn't do it often.  Walking was successful.  She is currently the heaviest she's been.  She has gained weight over the years after taking Depo.  She feels like she's gained more weight recently from more swelling.  She holds a lot of fluid and stopped taking diuretic Started 10 day juice cleanse.  She is also participating in a boot camp program: [employer] and he's prescribed a meal plan that is very restrictive. (no carbohydrates except sweet potatoes, no beverages except water, no proteins except ultra lean...) She skips breakfast and lunch on the weekends.  She might skip dinner on week days.    MEDICATIONS: see list   DIETARY INTAKE:  Usual eating pattern includes 1-3 meals and 1-3 snacks per day.  24-hr recall:  B ( AM): usually 2 boiled egg whites with 1 [place] sausage and 2 slices bacon  Snk ( AM): oats and honey granola bar.    L ( PM): chicken salad with vegetable crackers Snk ( PM): maybe jalapeno flavored chips D ( PM): spaghetti with wwheat noodles and [place] meatball and corn Snk ( PM): maybe some more chips Beverages: 64 oz water with sugar-free flavoring.  Sweet tea (16-20 oz), detox tea  Usual physical activity: boot camp 3 days/week for 1 hour then walk 30+ afterwards; might exercise on off days  Estimated energy needs: 1600 calories 180 g carbohydrates 120 g protein 44 g fat  Progress Towards Goal(s):  In progress.   Nutritional Diagnosis:  NB-1.1 Food and nutrition-related knowledge deficit As related to proper balance of fats, carbohydrates, and proteins.  As evidenced by obesity, HTN, hyperlipidemia, and  hyperglycemia.    Intervention:  Nutrition counseling provided.  Discussed metabolic effects of meal skipping and discouraged this practice.  Also discussed ineffectiveness of juices/cleanses/shakes.  Encouraged her to take nutrition advice only from certified nutrition professionals, not what she reads online or hears from her personal trainer's wife.  Discussed healthy options for breakfast, lunch, and dinner, as well as snacks.  Discouraged intense exercise programs that are not enjoyable.  Suggested body movement that is sustainable and enjoyable like walking, dancing, gardening, etc.  Discouraged overly strict diets that exclude too many foods/beverages.  Suggested sustainable life changes.   Reviewed lab data.  Suggested talking with MD about any questions she might have.   Monitoring/Evaluation:  Dietary intake, exercise, and body weight in a few week(s).

## 2013-04-17 NOTE — Patient Instructions (Addendum)
Aim for 3 meals a day.  Avoid meal skipping.  Breakfast: cereal, scrambled eggs, toast, bagel thins with cinnamon butter spread thinly.  Maybe fruit Try [place] sausage Morning snack: granola bar, fruit, string cheese Lunch: salad with protein, sandwiches, soup, (lean protein, whole grain carbohydrate, and fruit or vegetable with small amount of heart healthy fat- olive oil, nuts, avocado) Snack: whole wheat crackers, fruit, raw veggies Dinner: lean protein, whole grain carbohydrate, vegetable, or fruit Snack: fruit, sherbert, frozen yogurt.

## 2013-04-24 ENCOUNTER — Ambulatory Visit (INDEPENDENT_AMBULATORY_CARE_PROVIDER_SITE_OTHER): Payer: BC Managed Care – PPO | Admitting: Physician Assistant

## 2013-04-24 VITALS — BP 130/90 | HR 96 | Temp 98.0°F | Resp 16 | Ht 66.0 in | Wt 218.0 lb

## 2013-04-24 DIAGNOSIS — R350 Frequency of micturition: Secondary | ICD-10-CM

## 2013-04-24 DIAGNOSIS — R300 Dysuria: Secondary | ICD-10-CM

## 2013-04-24 LAB — POCT URINALYSIS DIPSTICK
Bilirubin, UA: NEGATIVE
Glucose, UA: NEGATIVE
Leukocytes, UA: NEGATIVE
Nitrite, UA: NEGATIVE

## 2013-04-24 LAB — POCT UA - MICROSCOPIC ONLY
Bacteria, U Microscopic: NEGATIVE
Yeast, UA: NEGATIVE

## 2013-04-24 MED ORDER — CIPROFLOXACIN HCL 500 MG PO TABS: 500.0000 mg | ORAL_TABLET | Freq: Two times a day (BID) | ORAL | Status: DC

## 2013-04-24 NOTE — Progress Notes (Signed)
  Subjective:    Patient ID: Kara Cannon, female    DOB: [birth year], 42 y.o.   MRN: [id number]  HPI 42 year old female presents with 1 week history of urinary frequency, suprapubic pressure, and slight vaginal irritation.  States she does have a history of UTI's with the last about 1 year ago. Does not have dysuria and has not noticed any hematuria.  Slight clear, thin vaginal discharge but states this is typical when she gets UTI's.  Denies fever, chills, nausea, vomiting.    Patient also requesting "something for anxiety" - has seen her PCP about this who instructed her on conservative measures. States this is not working and she has had increase stress at work and with her husband. Admits to difficulty sleeping despite trial with melatonin and benadryl.      Review of Systems  Constitutional: Negative for fever and chills.  Gastrointestinal: Positive for nausea and abdominal pain (suprapubic tenderness). Negative for vomiting.  Genitourinary: Positive for frequency and vaginal discharge. Negative for dysuria, hematuria and vaginal bleeding.  Skin: Negative for rash.  Neurological: Negative for dizziness and headaches.       Objective:   Physical Exam  Constitutional: She is oriented to person, place, and time. She appears well-developed and well-nourished.  HENT:  Head: Normocephalic and atraumatic.  Right Ear: External ear normal.  Left Ear: External ear normal.  Eyes: Conjunctivae are normal.  Neck: Normal range of motion.  Cardiovascular: Normal rate, regular rhythm and normal heart sounds.   Pulmonary/Chest: Effort normal and breath sounds normal.  Abdominal: Soft. Bowel sounds are normal. There is tenderness (suprapubic). There is no rebound, no guarding and no CVA tenderness.  Neurological: She is alert and oriented to person, place, and time.  Psychiatric: She has a normal mood and affect. Her behavior is normal. Judgment and thought content normal.      Results for  orders placed in visit on 04/24/13  POCT UA - MICROSCOPIC ONLY      Result Value Range   WBC, Ur, HPF, POC 0-2     RBC, urine, microscopic neg     Bacteria, U Microscopic neg     Mucus, UA moderate     Epithelial cells, urine per micros 5-12     Crystals, Ur, HPF, POC neg     Casts, Ur, LPF, POC neg     Yeast, UA neg    POCT URINALYSIS DIPSTICK      Result Value Range   Color, UA yellow     Clarity, UA clear     Glucose, UA neg     Bilirubin, UA neg     Ketones, UA trace     Spec Grav, UA 1.025     Blood, UA moderate     pH, UA 5.0     Protein, UA neg     Urobilinogen, UA 0.2     Nitrite, UA neg     Leukocytes, UA Negative         Assessment & Plan:   Dysuria - Plan: POCT UA - Microscopic Only, POCT urinalysis dipstick, Urine culture, ciprofloxacin (CIPRO) 500 MG tablet  Urinary frequency  Will go ahead and treat with Cipro bid x 5 days Urine culture sent Increase fluids and rest Recommend follow up with PCP for further evaluation and treatment of anxiety Follow up here if symptoms worsen or fail to improve.

## 2013-04-26 LAB — URINE CULTURE: Colony Count: 9000

## 2013-05-15 ENCOUNTER — Other Ambulatory Visit: Payer: Self-pay | Admitting: *Deleted

## 2013-05-15 MED ORDER — HYDROCHLOROTHIAZIDE 12.5 MG PO TABS: 12.5000 mg | ORAL_TABLET | Freq: Every day | ORAL | Status: DC

## 2013-05-15 MED ORDER — POTASSIUM CHLORIDE ER 10 MEQ PO TBCR: 10.0000 meq | EXTENDED_RELEASE_TABLET | Freq: Two times a day (BID) | ORAL | Status: DC

## 2013-05-20 ENCOUNTER — Encounter: Payer: Self-pay | Admitting: *Deleted

## 2013-05-20 ENCOUNTER — Encounter: Payer: BC Managed Care – PPO | Attending: Internal Medicine | Admitting: *Deleted

## 2013-05-20 VITALS — Ht 66.5 in | Wt 220.3 lb

## 2013-05-20 DIAGNOSIS — R7309 Other abnormal glucose: Secondary | ICD-10-CM | POA: Insufficient documentation

## 2013-05-20 DIAGNOSIS — Z713 Dietary counseling and surveillance: Secondary | ICD-10-CM | POA: Insufficient documentation

## 2013-05-20 DIAGNOSIS — E669 Obesity, unspecified: Secondary | ICD-10-CM | POA: Insufficient documentation

## 2013-05-20 NOTE — Progress Notes (Signed)
  Medical Nutrition Therapy:  Appt start time: 1030 end time:  1100.  Assessment:  Primary concerns today: Kara Cannon is here for follow up nutrition counseling pertaining to obesity and prediabetes.  She has made some changes since last visit: she stopped the boot camp class and the juice cleanse.  She also stopped the restrictive diet the personal trainer prescribed.  She has been eating more balanced meals, but not been as physically active.  She drinks a lot of sweet tea and feels guilty about the food choices she make.   MEDICATIONS: see list   DIETARY INTAKE:  Usual eating pattern includes 1-3 meals and 1-3 snacks per day.  24-hr recall:  B ( AM): 2 egg white with 2 pork or [place] bacon or smoked sausage with water sweet tea.  Sometimes hot tea.  Sometimes detox tea Snk ( AM): sometimes, but not really   L ( PM): sometimes salad or leftovers (baked chicken and rice) [place] gray and rice with broccoli or spinach.  Might get something from cafeteria: [place] and cheese on wheat with chips.  Might get panino.  Won't eat it all.  Gets water, lemonade, or sweet tea Snk ( PM): chocolate chip cookie D ( PM): pasta or lo mein and broccoli; sometimes chicken wings; sometimes salad.  Might go out 3 times: olive garden or chili's or [place].  Sweet tea Snk ( PM): jalapeno chips or maybe chocolate chip cookie.   Beverages:sweet tea, detox tea  Usual physical activity: hasn't been exercising as much lately.  Quit boot camp and has been walking some.   Plans to start walking and going to [employer].    Estimated energy needs: 1600 calories 180 g carbohydrates 120 g protein 44 g fat  Progress Towards Goal(s):  In progress.   Nutritional Diagnosis:  NB-1.1 Food and nutrition-related knowledge deficit As related to proper balance of fats, carbohydrates, and proteins.  As evidenced by obesity, HTN, hyperlipidemia, and hyperglycemia.    Intervention:  Nutrition counseling provided.  Aim for 3  meals/day - avoid meal skipping Limit sugary beverages to 1/day- drink more water Relax in the evenings and get to bed earlier- watch tv or read.  Let thoughts pass.  Do not get out of bed!!! Aim for pleasureable body movement- walk, zumba, dance 7 minute workout Limit instant foods like mircowave meals to reduce sodium consumption Eat without distractions: turn tv off and eat slowly.  Stop before getting stuffed Talk with MD if sleep doesn't improve  Lunch: leftovers or salad, sandwiches, soup; pb and j; bagel pizza  Monitoring/Evaluation:  Dietary intake, exercise, and body weight in 3 month (s).

## 2013-05-20 NOTE — Patient Instructions (Addendum)
Aim for 3 meals/day - avoid meal skipping Limit sugary beverages to 1/day- drink more water Relax in the evenings and get to bed earlier- watch tv or read.  Let thoughts pass.  Do not get out of bed!!! Aim for pleasureable body movement- walk, zumba, dance 7 minute workout Talk with MD if sleep doesn't improve   Lunch: leftovers or salad, sandwiches, soup; pb and j; bagel pizza

## 2013-06-28 ENCOUNTER — Other Ambulatory Visit (INDEPENDENT_AMBULATORY_CARE_PROVIDER_SITE_OTHER): Payer: BC Managed Care – PPO

## 2013-06-28 DIAGNOSIS — R7309 Other abnormal glucose: Secondary | ICD-10-CM

## 2013-06-28 DIAGNOSIS — E785 Hyperlipidemia, unspecified: Secondary | ICD-10-CM

## 2013-06-28 LAB — BASIC METABOLIC PANEL
Calcium: 8.9 mg/dL (ref 8.4–10.5)
GFR: 111.43 mL/min (ref >60.00)
Sodium: 141 mEq/L (ref 135–145)

## 2013-06-28 LAB — HEPATIC FUNCTION PANEL
ALT: 20 U/L (ref 0–35)
Albumin: 3.3 g/dL — ABNORMAL LOW (ref 3.5–5.2)
Alkaline Phosphatase: 65 U/L (ref 39–117)
Total Protein: 6.7 g/dL (ref 6.0–8.3)

## 2013-06-28 LAB — LIPID PANEL
Cholesterol: 169 mg/dL (ref 0–200)
HDL: 47.2 mg/dL (ref >39.00)
Triglycerides: 141 mg/dL (ref 0.0–149.0)

## 2013-07-05 ENCOUNTER — Ambulatory Visit (INDEPENDENT_AMBULATORY_CARE_PROVIDER_SITE_OTHER): Payer: BC Managed Care – PPO | Admitting: Internal Medicine

## 2013-07-05 VITALS — BP 126/80 | Temp 98.3°F | Wt 224.0 lb

## 2013-07-05 DIAGNOSIS — E785 Hyperlipidemia, unspecified: Secondary | ICD-10-CM

## 2013-07-05 DIAGNOSIS — M62830 Muscle spasm of back: Secondary | ICD-10-CM | POA: Insufficient documentation

## 2013-07-05 DIAGNOSIS — R7309 Other abnormal glucose: Secondary | ICD-10-CM

## 2013-07-05 DIAGNOSIS — M62838 Other muscle spasm: Secondary | ICD-10-CM

## 2013-07-05 MED ORDER — TRAMADOL HCL 50 MG PO TABS: 50.0000 mg | ORAL_TABLET | Freq: Three times a day (TID) | ORAL | Status: DC | PRN

## 2013-07-05 NOTE — Patient Instructions (Addendum)
Please complete the following lab tests before your next follow up appointment: BMET, A1c - 790.29 Serum tryptase - 728.85 24-hour urine for N-methyl histamine and 11-beta-prostaglandin F2

## 2013-07-05 NOTE — Progress Notes (Signed)
Subjective:    Patient ID: Kara Cannon, female    DOB: March 14, 1971, 42 y.o.   MRN: [id number]  HPI  42 year old African American female with hypertension, hyperlipidemia, and prediabetes for follow up.  Patient has seen by dietician several times. She is decreasing her carbohydrate intake. Patient reports mild weight loss. We reviewed her blood work. Her A1c is stable at 6.3.  Patient reports taking tramadol 50 mg intermittently for unexplained muscle spasms. They usually occur in her low back and she has experienced in her legs since her early 44s.  She also has history of unexplained rash and possible food allergies.  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 She was sent to Dr. Sharl Ma is low for rheumatologic evaluation to rule out autoimmune disease. Patient reports her workup was negative.  She continues to have unexplained dark spots in the back of her legs and low back.  Mother diagnosed with multiple food allergies.   Review of Systems Negative for hypotensive episodes,  Occasional loose stools    Past Medical History  Diagnosis Date  . Effusion of ankle and foot joint 06/22/2009  . CHICKENPOX, HX OF 06/22/2009  . Cough 09/17/2009  . EPISCLERITIS 06/22/2009  . Headache(784.0) 06/22/2009  . HEMORRHOIDS, INTERNAL 06/23/2003  . HYPERTENSION 06/22/2009  . MIGRAINE HEADACHE 06/22/2009  . PLANTAR WART, LEFT 08/03/2009  . Unspecified vitamin D deficiency 06/22/2009  . Urinary frequency 06/22/2009  . UTI'S, HX OF 06/22/2009  . Premature ovarian failure   . IC (interstitial cystitis)   . CARDIAC MURMUR 06/22/2009    Mitral regurgitation  . Hyperlipidemia     History   Social History  . Marital Status: Married    Spouse Name: N/A    Number of Children: N/A  . Years of Education: N/A   Occupational History  . New Business Associate     [employer]   Social History Main Topics  . Smoking status: Never Smoker   . Smokeless tobacco: Not on file     Comment: Married  .  Alcohol Use: No  . Drug Use: No  . Sexual Activity: Yes    Birth Control/ Protection: Post-menopausal   Other Topics Concern  . Not on file   Social History Narrative  . No narrative on file    Past Surgical History  Procedure Laterality Date  . Cystoscopy  02/2009    Dr. Annabell Howells    Family History  Problem Relation Age of Onset  . Skin cancer Maternal Uncle   . Hypertension Mother   . Hypertension Father   . Ovarian cancer Paternal Grandmother   . Cancer Paternal Grandmother   . Diabetes Maternal Grandmother   . Hyperlipidemia Maternal Grandmother     Allergies  Allergen Reactions  . Corn-Containing Products   . Ibuprofen   . Naproxen Sodium   . Peanut-Containing Drug Products   . Penicillins   . Sulfonamide Derivatives     Current Outpatient Prescriptions on File Prior to Visit  Medication Sig Dispense Refill  . Ascorbic Acid (VITAMIN C PO) Take by mouth.        Marland Kitchen aspirin-acetaminophen-caffeine (EXCEDRIN MIGRAINE) 250-250-65 MG per tablet Take 1 tablet by mouth every 6 (six) hours as needed for pain.      Marland Kitchen b complex vitamins tablet Take 1 tablet by mouth daily.      Marland Kitchen BIOTIN PO Take by mouth.        . Cholecalciferol (VITAMIN D) 2000 UNITS CAPS Take by mouth daily.        Marland Kitchen  clotrimazole-betamethasone (LOTRISONE) cream Apply topically 2 (two) times daily.  30 g  1  . Fennel Oil OIL by Does not apply route.      . hydrochlorothiazide (HYDRODIURIL) 12.5 MG tablet Take 1 tablet (12.5 mg total) by mouth daily.  90 tablet  1  . Multiple Vitamin (MULTIVITAMIN) tablet Take 1 tablet by mouth daily.        . potassium chloride (K-DUR) 10 MEQ tablet Take 1 tablet (10 mEq total) by mouth 2 (two) times daily.  180 tablet  1  . pravastatin (PRAVACHOL) 20 MG tablet Take 1 tablet (20 mg total) by mouth daily.  90 tablet  1  . TURMERIC CURCUMIN PO Take 1 tablet by mouth daily.       No current facility-administered medications on file prior to visit.    BP 126/80  Temp(Src)  98.3 F (36.8 C) (Oral)  Wt 224 lb (101.606 kg)  BMI 35.62 kg/m2    Objective:   Physical Exam  Constitutional: She is oriented to person, place, and time. She appears well-developed and well-nourished.  Cardiovascular: Normal rate, regular rhythm and normal heart sounds.   Pulmonary/Chest: Effort normal and breath sounds normal. She has no wheezes.  Abdominal: Soft. Bowel sounds are normal. There is no tenderness.  Musculoskeletal: She exhibits no edema.  Normal range of motion of lumbar spine,  Patient able to toe and heel walk without difficulty. Lower extremity muscle strength 5 out of 5  Neurological: She is alert and oriented to person, place, and time. She displays normal reflexes. No cranial nerve deficit. She exhibits normal muscle tone.  Skin: Skin is warm and dry.  Diffuse tiny dark spots back and front of legs.  Psychiatric: She has a normal mood and affect. Her behavior is normal.          Assessment & Plan:

## 2013-07-05 NOTE — Assessment & Plan Note (Signed)
Stable.  Continue dietary mgt.  A1c is stable.   Lab Results  Component Value Date   HGBA1C 6.3 06/28/2013

## 2013-07-05 NOTE — Assessment & Plan Note (Addendum)
Patient reports an unusual history of unexplained muscle spasms, possible allergic reactions and unexplained rash (question urticaria pigmentosa). Discussed considering possibility of mastocytosis. Obtain serum tryptase level and  24-hour urine for N-methyl histamine and 11-beta-prostaglandin F2.  Use tramadol 50 mg as needed.

## 2013-07-05 NOTE — Assessment & Plan Note (Signed)
Good response to pravastatin.   Lab Results  Component Value Date   CHOL 169 06/28/2013   HDL 47.20 06/28/2013   LDLCALC 94 06/28/2013   LDLDIRECT 159.7 03/29/2013   TRIG 141.0 06/28/2013   CHOLHDL 4 06/28/2013   Lab Results  Component Value Date   ALT 20 06/28/2013   AST 16 06/28/2013   ALKPHOS 65 06/28/2013   BILITOT 0.4 06/28/2013

## 2013-07-08 NOTE — Addendum Note (Signed)
Addended by: Bonnye Fava on: 07/08/2013 03:50 PM   Modules accepted: Orders

## 2013-07-08 NOTE — Addendum Note (Signed)
Addended by: Bonnye Fava on: 07/08/2013 03:36 PM   Modules accepted: Orders

## 2013-07-12 ENCOUNTER — Ambulatory Visit (INDEPENDENT_AMBULATORY_CARE_PROVIDER_SITE_OTHER): Payer: BC Managed Care – PPO | Admitting: Internal Medicine

## 2013-07-12 ENCOUNTER — Encounter: Payer: Self-pay | Admitting: Internal Medicine

## 2013-07-12 VITALS — BP 140/90 | Temp 98.3°F | Wt 225.0 lb

## 2013-07-12 DIAGNOSIS — J029 Acute pharyngitis, unspecified: Secondary | ICD-10-CM | POA: Insufficient documentation

## 2013-07-12 DIAGNOSIS — Z23 Encounter for immunization: Secondary | ICD-10-CM

## 2013-07-12 MED ORDER — LIDOCAINE VISCOUS 2 % MT SOLN: OROMUCOSAL | Status: DC

## 2013-07-12 MED ORDER — VALACYCLOVIR HCL 1 G PO TABS: 1000.0000 mg | ORAL_TABLET | Freq: Two times a day (BID) | ORAL | Status: DC

## 2013-07-12 NOTE — Addendum Note (Signed)
Addended by: Alfred Levins D on: 07/12/2013 05:22 PM   Modules accepted: Orders

## 2013-07-12 NOTE — Progress Notes (Signed)
Subjective:    Patient ID: Kara Cannon, female    DOB: 07/02/1971, 42 y.o.   MRN: [id number]  HPI  42 year old African American female with history of hypertension, hyperlipidemia and obesity/prediabetes complains of ulcers in the back of her throat. She noticed symptoms started 3 days ago. She also reports mild left upper throat/ear discomfort. She reports gagging after eating. She had similar episode in January when she had a viral infection. Her previous physician thought it was related to viral ulcers.  Her neighbor has upper respiratory infection   Review of Systems Negative for fever or chills, no dysphagia No genital ulcers  Past Medical History  Diagnosis Date  . Effusion of ankle and foot joint 06/22/2009  . CHICKENPOX, HX OF 06/22/2009  . Cough 09/17/2009  . EPISCLERITIS 06/22/2009  . Headache(784.0) 06/22/2009  . HEMORRHOIDS, INTERNAL 06/23/2003  . HYPERTENSION 06/22/2009  . MIGRAINE HEADACHE 06/22/2009  . PLANTAR WART, LEFT 08/03/2009  . Unspecified vitamin D deficiency 06/22/2009  . Urinary frequency 06/22/2009  . UTI'S, HX OF 06/22/2009  . Premature ovarian failure   . IC (interstitial cystitis)   . CARDIAC MURMUR 06/22/2009    Mitral regurgitation  . Hyperlipidemia     History   Social History  . Marital Status: Married    Spouse Name: N/A    Number of Children: N/A  . Years of Education: N/A   Occupational History  . New Business Associate     [employer]   Social History Main Topics  . Smoking status: Never Smoker   . Smokeless tobacco: Not on file     Comment: Married  . Alcohol Use: No  . Drug Use: No  . Sexual Activity: Yes    Birth Control/ Protection: Post-menopausal   Other Topics Concern  . Not on file   Social History Narrative  . No narrative on file    Past Surgical History  Procedure Laterality Date  . Cystoscopy  02/2009    Dr. Annabell Howells    Family History  Problem Relation Age of Onset  . Skin cancer Maternal Uncle   . Hypertension  Mother   . Hypertension Father   . Ovarian cancer Paternal Grandmother   . Cancer Paternal Grandmother   . Diabetes Maternal Grandmother   . Hyperlipidemia Maternal Grandmother     Allergies  Allergen Reactions  . Corn-Containing Products   . Ibuprofen   . Naproxen Sodium   . Peanut-Containing Drug Products   . Penicillins   . Sulfonamide Derivatives     Current Outpatient Prescriptions on File Prior to Visit  Medication Sig Dispense Refill  . Ascorbic Acid (VITAMIN C PO) Take by mouth.        Marland Kitchen aspirin-acetaminophen-caffeine (EXCEDRIN MIGRAINE) 250-250-65 MG per tablet Take 1 tablet by mouth every 6 (six) hours as needed for pain.      Marland Kitchen b complex vitamins tablet Take 1 tablet by mouth daily.      Marland Kitchen BIOTIN PO Take by mouth.        . Cholecalciferol (VITAMIN D) 2000 UNITS CAPS Take by mouth daily.        . clotrimazole-betamethasone (LOTRISONE) cream Apply topically 2 (two) times daily.  30 g  1  . Fennel Oil OIL by Does not apply route.      . hydrochlorothiazide (HYDRODIURIL) 12.5 MG tablet Take 1 tablet (12.5 mg total) by mouth daily.  90 tablet  1  . Multiple Vitamin (MULTIVITAMIN) tablet Take 1 tablet by mouth daily.        Marland Kitchen  potassium chloride (K-DUR) 10 MEQ tablet Take 1 tablet (10 mEq total) by mouth 2 (two) times daily.  180 tablet  1  . pravastatin (PRAVACHOL) 20 MG tablet Take 1 tablet (20 mg total) by mouth daily.  90 tablet  1  . traMADol (ULTRAM) 50 MG tablet Take 1 tablet (50 mg total) by mouth every 8 (eight) hours as needed for pain.  30 tablet  1  . TURMERIC CURCUMIN PO Take 1 tablet by mouth daily.       No current facility-administered medications on file prior to visit.    BP 140/90  Temp(Src) 98.3 F (36.8 C) (Oral)  Wt 225 lb (102.059 kg)  BMI 35.78 kg/m2    Rapid strep is negative    Objective:   Physical Exam  Constitutional: She is oriented to person, place, and time. She appears well-developed and well-nourished.  HENT:  Head: Normocephalic  and atraumatic.  Right Ear: External ear normal.  Left Ear: External ear normal.  Mouth/Throat: No oropharyngeal exudate.  Shallow ulcer of left oropharyngeal arch  Neck: Neck supple.  Mild left upper neck tenderness, no adenopathy  Cardiovascular: Normal rate, regular rhythm and normal heart sounds.   Pulmonary/Chest: Effort normal and breath sounds normal. She has no wheezes.  Musculoskeletal: She exhibits no edema.  Neurological: She is alert and oriented to person, place, and time. No cranial nerve deficit.  Psychiatric: She has a normal mood and affect. Her behavior is normal.          Assessment & Plan:

## 2013-07-12 NOTE — Patient Instructions (Addendum)
Please contact our office if your symptoms do not improve or gets worse.  

## 2013-07-12 NOTE — Assessment & Plan Note (Addendum)
Patient has shallow ulcers mainly near left oropharyngeal arch. She had similar episode in January 2014. Rapid strep swab negative. Treat with Valtrex 1 g twice daily for 7 days. Patient advised to use viscous lidocaine 4 times a day as needed.  Patient advised to call office if symptoms persist or worsen.  She questions whether her immune system is weak.  Check HIV antibody.

## 2013-07-15 ENCOUNTER — Other Ambulatory Visit: Payer: BC Managed Care – PPO

## 2013-07-15 DIAGNOSIS — J029 Acute pharyngitis, unspecified: Secondary | ICD-10-CM

## 2013-07-15 DIAGNOSIS — M62838 Other muscle spasm: Secondary | ICD-10-CM

## 2013-07-19 LAB — HISTAMINE, 24 HOUR URINE: Histamine, 24 hour Ur: 0.037 mg/24 h (ref 0.006–0.131)

## 2013-07-26 ENCOUNTER — Ambulatory Visit (INDEPENDENT_AMBULATORY_CARE_PROVIDER_SITE_OTHER): Payer: BC Managed Care – PPO | Admitting: Internal Medicine

## 2013-07-26 VITALS — BP 122/80 | HR 79 | Temp 98.7°F | Resp 18 | Ht 67.0 in | Wt 220.0 lb

## 2013-07-26 DIAGNOSIS — J329 Chronic sinusitis, unspecified: Secondary | ICD-10-CM

## 2013-07-26 MED ORDER — HYDROCODONE-HOMATROPINE 5-1.5 MG/5ML PO SYRP: 5.0000 mL | ORAL_SOLUTION | Freq: Four times a day (QID) | ORAL | Status: DC | PRN

## 2013-07-26 MED ORDER — AMOXICILLIN 875 MG PO TABS: 875.0000 mg | ORAL_TABLET | Freq: Two times a day (BID) | ORAL | Status: DC

## 2013-07-26 NOTE — Progress Notes (Signed)
  Subjective:    Patient ID: Kara Cannon, female    DOB: 1971-01-15, 42 y.o.   MRN: [id number]  HPI  42 YO female comes into the clinic with complaints of cough, sore throat, and body aches. It all started Monday. She states she has some nasal congestion. Last night she was unable to stay asleep due to the coughing. Purulent d/c in am. She went to the nurse at her employer. The nurse advised her to go to the doctor.    Denies ear pain.   Review of Systems  Constitutional: Positive for fever.  HENT: Positive for congestion, sore throat, rhinorrhea, sneezing, postnasal drip and sinus pressure. Negative for ear pain.   Eyes: Positive for itching.  Respiratory: Positive for cough.   Gastrointestinal: Negative.   Genitourinary: Negative.        Objective:   Physical Exam BP 122/80  Pulse 79  Temp(Src) 98.7 F (37.1 C)  Resp 18  Ht 5\' 7"  (1.702 m)  Wt 220 lb (99.791 kg)  BMI 34.45 kg/m2  SpO2 98% No acute distress TMs clear Nares with purulent discharge Tender maxillary areas to percussion Throat clear No nodes Chest clear       Assessment & Plan:  Unspecified sinusitis (chronic) - Plan: HYDROcodone-homatropine (HYCODAN) 5-1.5 MG/5ML syrup, amoxicillin (AMOXIL) 875 MG tablet  Followup if not better in 7 days

## 2013-08-19 ENCOUNTER — Ambulatory Visit: Payer: BC Managed Care – PPO | Admitting: *Deleted

## 2013-08-21 ENCOUNTER — Other Ambulatory Visit: Payer: Self-pay | Admitting: Internal Medicine

## 2013-08-21 DIAGNOSIS — D4709 Other mast cell neoplasms of uncertain behavior: Secondary | ICD-10-CM

## 2013-08-29 ENCOUNTER — Telehealth: Payer: Self-pay | Admitting: *Deleted

## 2013-08-29 DIAGNOSIS — D4709 Other mast cell neoplasms of uncertain behavior: Secondary | ICD-10-CM

## 2013-08-29 NOTE — Telephone Encounter (Signed)
Referral order placed.

## 2013-08-29 NOTE — Telephone Encounter (Signed)
Message copied by Jacqualyn Posey on Thu Aug 29, 2013  2:16 PM ------      Message from: Meda Coffee      Created: Thu Aug 29, 2013 12:22 PM       Can you cancel referral to Dr. Elvera Lennox and refer see if Dr. Myna Hidalgo will see this pt re: possible mastocytosis?            RY      ----- Message -----         From: Carlus Pavlov, MD         Sent: 08/29/2013   8:34 AM           To: Meda Coffee, DO            Dear Dr Artist Pais,      I saw that you referred this pt to me for r/o mastocytosis. I reviewed prior workup. As I said before, I am not familiar with more detailed mastocytosis screening beyond the urinary histamine and tryptase level. I sometimes need to rule mastocytosis out during investigation for flushing or I actually treat osteoporosis induced by it but I do not treat the disease and I am not familiar with the Pg F testing. I believe she would benefit from a referral to ?hem/onc instead...      Sincerely,      Silvestre Mesi             ------

## 2013-08-30 ENCOUNTER — Telehealth: Payer: Self-pay | Admitting: Internal Medicine

## 2013-08-30 NOTE — Telephone Encounter (Signed)
Place referral on hold until I next office visit.  I will further review and discuss need for additional referral at next OV.

## 2013-08-30 NOTE — Telephone Encounter (Signed)
Pt called due to Endocrin Referral. Pt was contacted my endocrinology and canceled her appt stating our office would be in contact with her. It appears that Dr. Elvera Lennox feels that pt would be best treated by Hem/oncology. A new referral was placed however Dr. Myna Hidalgo reviewed the pts records and does not feel there is a need for him to see the pt. Please advise.

## 2013-08-30 NOTE — Telephone Encounter (Signed)
Left message on machine for patient to return our call 

## 2013-09-02 ENCOUNTER — Ambulatory Visit: Payer: BC Managed Care – PPO | Admitting: Internal Medicine

## 2013-09-05 NOTE — Telephone Encounter (Signed)
Pt aware.

## 2013-09-23 ENCOUNTER — Other Ambulatory Visit: Payer: BC Managed Care – PPO

## 2013-09-23 ENCOUNTER — Other Ambulatory Visit (INDEPENDENT_AMBULATORY_CARE_PROVIDER_SITE_OTHER): Payer: BC Managed Care – PPO

## 2013-09-23 DIAGNOSIS — M62838 Other muscle spasm: Secondary | ICD-10-CM

## 2013-09-23 DIAGNOSIS — E785 Hyperlipidemia, unspecified: Secondary | ICD-10-CM

## 2013-09-23 DIAGNOSIS — R7309 Other abnormal glucose: Secondary | ICD-10-CM

## 2013-09-23 LAB — LIPID PANEL: Cholesterol: 205 mg/dL — ABNORMAL HIGH (ref 0–200)

## 2013-09-23 LAB — HEMOGLOBIN A1C: Hgb A1c MFr Bld: 6.3 % (ref 4.6–6.5)

## 2013-09-23 LAB — BASIC METABOLIC PANEL
Calcium: 9.1 mg/dL (ref 8.4–10.5)
Chloride: 108 mEq/L (ref 96–112)
Creatinine, Ser: 0.8 mg/dL (ref 0.4–1.2)

## 2013-09-23 LAB — HEPATIC FUNCTION PANEL
ALT: 22 U/L (ref 0–35)
AST: 23 U/L (ref 0–37)
Albumin: 3.7 g/dL (ref 3.5–5.2)
Alkaline Phosphatase: 70 U/L (ref 39–117)
Total Protein: 7.5 g/dL (ref 6.0–8.3)

## 2013-09-25 ENCOUNTER — Other Ambulatory Visit: Payer: Self-pay

## 2013-09-25 DIAGNOSIS — Z1231 Encounter for screening mammogram for malignant neoplasm of breast: Secondary | ICD-10-CM

## 2013-09-26 ENCOUNTER — Other Ambulatory Visit: Payer: Self-pay

## 2013-10-03 ENCOUNTER — Encounter: Payer: Self-pay | Admitting: Internal Medicine

## 2013-10-04 ENCOUNTER — Ambulatory Visit (INDEPENDENT_AMBULATORY_CARE_PROVIDER_SITE_OTHER): Payer: BC Managed Care – PPO | Admitting: Internal Medicine

## 2013-10-04 ENCOUNTER — Encounter: Payer: Self-pay | Admitting: Internal Medicine

## 2013-10-04 VITALS — BP 124/72 | HR 72 | Temp 98.1°F | Ht 67.0 in | Wt 219.0 lb

## 2013-10-04 DIAGNOSIS — L258 Unspecified contact dermatitis due to other agents: Secondary | ICD-10-CM

## 2013-10-04 DIAGNOSIS — E785 Hyperlipidemia, unspecified: Secondary | ICD-10-CM

## 2013-10-04 DIAGNOSIS — L853 Xerosis cutis: Secondary | ICD-10-CM | POA: Insufficient documentation

## 2013-10-04 DIAGNOSIS — I10 Essential (primary) hypertension: Secondary | ICD-10-CM

## 2013-10-04 MED ORDER — AMMONIUM LACTATE 12 % EX CREA: TOPICAL_CREAM | CUTANEOUS | Status: DC | PRN

## 2013-10-04 MED ORDER — CLOTRIMAZOLE-BETAMETHASONE 1-0.05 % EX CREA: TOPICAL_CREAM | Freq: Two times a day (BID) | CUTANEOUS | Status: DC

## 2013-10-04 MED ORDER — PRAVASTATIN SODIUM 40 MG PO TABS: 40.0000 mg | ORAL_TABLET | Freq: Every day | ORAL | Status: DC

## 2013-10-04 NOTE — Progress Notes (Signed)
Subjective:    Patient ID: Kara Cannon, female    DOB: [birth year], 42 y.o.   MRN: [id number]  HPI  42 year old African American female with history of hypertension, hyperlipidemia and prediabetes for followup. Do to several unexplained symptoms patient was tested for mastocytosis. 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  Workup for mastocytosis was unremarkable.  Hyperlipidemia-we reviewed her lipid panel. It is somewhat improved but still suboptimal. She is currently taking pravastatin 20 mg once daily. She complains of mild intermittent upper extremity discomfort.  Hypertension - stable  She still has scattered dark spots back of her legs and low back. Most of hyperpigmented areas isolated to her follicles.  Review of Systems She complains of dry skin.  Negative for chest pain  Past Medical History  Diagnosis Date  . Effusion of ankle and foot joint 06/22/2009  . CHICKENPOX, HX OF 06/22/2009  . Cough 09/17/2009  . EPISCLERITIS 06/22/2009  . Headache(784.0) 06/22/2009  . HEMORRHOIDS, INTERNAL 06/23/2003  . HYPERTENSION 06/22/2009  . MIGRAINE HEADACHE 06/22/2009  . PLANTAR WART, LEFT 08/03/2009  . Unspecified vitamin D deficiency 06/22/2009  . Urinary frequency 06/22/2009  . UTI'S, HX OF 06/22/2009  . Premature ovarian failure   . IC (interstitial cystitis)   . CARDIAC MURMUR 06/22/2009    Mitral regurgitation  . Hyperlipidemia   . Allergy     History   Social History  . Marital Status: Married    Spouse Name: N/A    Number of Children: N/A  . Years of Education: N/A   Occupational History  . New Business Associate     [employer]   Social History Main Topics  . Smoking status: Never Smoker   . Smokeless tobacco: Not on file     Comment: Married  . Alcohol Use: No  . Drug Use: No  . Sexual Activity: Yes    Birth Control/ Protection: Post-menopausal   Other Topics Concern  . Not on file   Social History  Narrative  . No narrative on file    Past Surgical History  Procedure Laterality Date  . Cystoscopy  02/2009    Dr. Annabell Howells    Family History  Problem Relation Age of Onset  . Skin cancer Maternal Uncle   . Hypertension Mother   . Hypertension Father   . Ovarian cancer Paternal Grandmother   . Cancer Paternal Grandmother   . Diabetes Maternal Grandmother   . Hyperlipidemia Maternal Grandmother     Allergies  Allergen Reactions  . Corn-Containing Products   . Ibuprofen   . Naproxen Sodium   . Peanut-Containing Drug Products   . Penicillins   . Sulfonamide Derivatives     Current Outpatient Prescriptions on File Prior to Visit  Medication Sig Dispense Refill  . Ascorbic Acid (VITAMIN C PO) Take by mouth.        Marland Kitchen aspirin-acetaminophen-caffeine (EXCEDRIN MIGRAINE) 250-250-65 MG per tablet Take 1 tablet by mouth every 6 (six) hours as needed for pain.      Marland Kitchen b complex vitamins tablet Take 1 tablet by mouth daily.      Marland Kitchen BIOTIN PO Take by mouth.        . Cholecalciferol (VITAMIN D) 2000 UNITS CAPS Take by mouth daily.        . Fennel Oil OIL by Does not apply route.      . hydrochlorothiazide (HYDRODIURIL) 12.5 MG tablet Take 1 tablet (12.5 mg total) by mouth daily.  90 tablet  1  . Multiple Vitamin (MULTIVITAMIN) tablet Take 1 tablet by mouth daily.        . potassium chloride (K-DUR) 10 MEQ tablet Take 1 tablet (10 mEq total) by mouth 2 (two) times daily.  180 tablet  1  . traMADol (ULTRAM) 50 MG tablet Take 1 tablet (50 mg total) by mouth every 8 (eight) hours as needed for pain.  30 tablet  1  . TURMERIC CURCUMIN PO Take 1 tablet by mouth daily.       No current facility-administered medications on file prior to visit.    BP 124/72  Pulse 72  Temp(Src) 98.1 F (36.7 C) (Oral)  Ht 5\' 7"  (1.702 m)  Wt 219 lb (99.338 kg)  BMI 34.29 kg/m2       Objective:   Physical Exam  Constitutional: She is oriented to person, place, and time. She appears well-developed and  well-nourished. No distress.  HENT:  Head: Normocephalic and atraumatic.  Right Ear: External ear normal.  Left Ear: External ear normal.  Mouth/Throat: Oropharynx is clear and moist.  Cardiovascular: Normal rate, regular rhythm and normal heart sounds.   No murmur heard. Pulmonary/Chest: Effort normal and breath sounds normal. She has no wheezes.  Musculoskeletal: She exhibits no edema.  Neurological: She is alert and oriented to person, place, and time. No cranial nerve deficit.  Skin: Skin is warm and dry.  Psychiatric: She has a normal mood and affect. Her behavior is normal.          Assessment & Plan:

## 2013-10-04 NOTE — Progress Notes (Signed)
Pre visit review using our clinic review tool, if applicable. No additional management support is needed unless otherwise documented below in the visit note. 

## 2013-10-04 NOTE — Assessment & Plan Note (Signed)
Stable.  Continue same dose of HCTZ. BP: 124/72 mmHg

## 2013-10-04 NOTE — Assessment & Plan Note (Signed)
Trial of Lac Hydrin

## 2013-10-04 NOTE — Assessment & Plan Note (Addendum)
Increase pravastatin to 40 mg. Monitor LFTs.  Patient to try OTC CoQ 10 supplements.  Lab Results  Component Value Date   CHOL 205* 09/23/2013   HDL 51.00 09/23/2013   LDLCALC 94 06/28/2013   LDLDIRECT 140.2 09/23/2013   TRIG 134.0 09/23/2013   CHOLHDL 4 09/23/2013   Lab Results  Component Value Date   ALT 22 09/23/2013   AST 23 09/23/2013   ALKPHOS 70 09/23/2013   BILITOT 0.5 09/23/2013

## 2013-10-25 ENCOUNTER — Ambulatory Visit
Admission: RE | Admit: 2013-10-25 | Discharge: 2013-10-25 | Disposition: A | Discharge status: Home / Self Care | Payer: BC Managed Care – PPO | Source: Ambulatory Visit

## 2013-10-25 DIAGNOSIS — Z1231 Encounter for screening mammogram for malignant neoplasm of breast: Secondary | ICD-10-CM

## 2013-11-28 ENCOUNTER — Ambulatory Visit (INDEPENDENT_AMBULATORY_CARE_PROVIDER_SITE_OTHER): Payer: BC Managed Care – PPO | Admitting: Family Medicine

## 2013-11-28 VITALS — BP 128/82 | HR 122 | Temp 99.4°F | Resp 16 | Ht 66.5 in | Wt 223.0 lb

## 2013-11-28 DIAGNOSIS — J101 Influenza due to other identified influenza virus with other respiratory manifestations: Secondary | ICD-10-CM

## 2013-11-28 DIAGNOSIS — J111 Influenza due to unidentified influenza virus with other respiratory manifestations: Secondary | ICD-10-CM

## 2013-11-28 DIAGNOSIS — J069 Acute upper respiratory infection, unspecified: Secondary | ICD-10-CM

## 2013-11-28 LAB — POCT INFLUENZA A/B
Influenza A, POC: POSITIVE
Influenza B, POC: NEGATIVE

## 2013-11-28 MED ORDER — HYDROCOD POLST-CHLORPHEN POLST 10-8 MG/5ML PO LQCR: 5.0000 mL | Freq: Two times a day (BID) | ORAL | Status: DC | PRN

## 2013-11-28 MED ORDER — IPRATROPIUM BROMIDE 0.03 % NA SOLN: 2.0000 | Freq: Four times a day (QID) | NASAL | Status: DC

## 2013-11-28 MED ORDER — OSELTAMIVIR PHOSPHATE 75 MG PO CAPS: 75.0000 mg | ORAL_CAPSULE | Freq: Two times a day (BID) | ORAL | Status: DC

## 2013-11-28 MED ORDER — ACETAMINOPHEN 325 MG PO TABS: 1000.0000 mg | ORAL_TABLET | Freq: Four times a day (QID) | ORAL | Status: DC | PRN

## 2013-11-28 NOTE — Patient Instructions (Signed)

## 2013-11-28 NOTE — Progress Notes (Signed)
Subjective:    Patient ID: Kara Cannon, female    DOB: 10/06/1971, 43 y.o.   MRN: [id number]  Chief Complaint  Patient presents with  . URI   HPI This chart was scribed for Harmon Pier Lendell Gallick-MD by Celesta Gentile, Scribe. This patient was seen in room 11 and the patient's care was started at 2:53 PM.  HPI Comments: Kara Cannon is a 43 y.o. female who presents to the [facility] complaining of gradually worsening constant URI symptoms that started about 3 weeks ago, but she states yesterday the symptoms rapidly worsened.  She has an associated sore throat, rhinorrhea, non productive cough, sneezing, chills, subjective fever, generalized body aches, and HA.  She states she has tried Nyquil, Zicam, Tylenol, Motrin, DayQuil, and Sudafed without relief.  She denies any sick contacts at home.  She denies receiving the flu shot.   Past Surgical History  Procedure Laterality Date  . Cystoscopy  02/2009    Dr. Jeffie Pollock    Family History  Problem Relation Age of Onset  . Skin cancer Maternal Uncle   . Hypertension Mother   . Hypertension Father   . Ovarian cancer Paternal Grandmother   . Cancer Paternal Grandmother   . Diabetes Maternal Grandmother   . Hyperlipidemia Maternal Grandmother     History   Social History  . Marital Status: Married    Spouse Name: N/A    Number of Children: N/A  . 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 Smoking status: Never Smoker   . Smokeless tobacco: Not on file     Comment: Married  . Alcohol Use: No  . Drug Use: No  . Sexual Activity: Yes    Birth Control/ Protection: Post-menopausal   Other Topics Concern  . Not on file   Social History Narrative  . No narrative on file    Allergies  Allergen Reactions  . Corn-Containing Products   . Ibuprofen   . Naproxen Sodium   . Peanut-Containing Drug Products   . Penicillins   .  Sulfonamide Derivatives     Patient Active Problem List   Diagnosis Date Noted  . Dry skin dermatitis 10/04/2013  . Muscle spasm 07/05/2013  . Other abnormal glucose 04/05/2013  . Hyperlipidemia 04/05/2013  . Stress reaction 03/22/2013  . Premature ovarian failure   . IC (interstitial cystitis)   . PLANTAR WART, LEFT 08/03/2009  . UNSPECIFIED VITAMIN D DEFICIENCY 06/22/2009  . MIGRAINE HEADACHE 06/22/2009  . EPISCLERITIS 06/22/2009  . HYPERTENSION 06/22/2009  . INTERSTITIAL CYSTITIS 06/22/2009  . EFFUSION OF ANKLE AND FOOT JOINT 06/22/2009  . HEADACHE 06/22/2009  . CARDIAC MURMUR 06/22/2009  . URINARY FREQUENCY 06/22/2009  . UTI'S, HX OF 06/22/2009  . CHICKENPOX, HX OF 06/22/2009  . HEMORRHOIDS, INTERNAL 06/23/2003    Review of Systems  Constitutional: Positive for fever, chills, diaphoresis, activity change, appetite change and fatigue.  HENT: Positive for congestion, postnasal drip, rhinorrhea, sneezing and sore throat. Negative for dental problem, ear pain, sinus pressure and trouble swallowing.   Respiratory: Positive for cough. Negative for shortness of breath and wheezing.   Cardiovascular: Negative for chest pain.  Gastrointestinal: Negative for nausea, vomiting, abdominal pain and diarrhea.  Musculoskeletal: Positive for myalgias. Negative for back pain.  Skin: Negative for color change and rash.  Neurological: Positive for headaches. Negative for syncope.  Psychiatric/Behavioral: Positive for  sleep disturbance.  All other systems reviewed and are negative.   Triage Vitals: BP 128/82  Pulse 122  Temp(Src) 99.4 F (37.4 C) (Oral)  Resp 16  Ht 5' 6.5" (1.689 m)  Wt 223 lb (101.152 kg)  BMI 35.46 kg/m2  SpO2 97% Objective:   Physical Exam  Nursing note and vitals reviewed. Constitutional: She is oriented to person, place, and time. She appears well-developed and well-nourished. No distress.  HENT:  Head: Normocephalic and atraumatic.  Right Ear: External  ear and ear canal normal. A middle ear effusion is present.  Left Ear: External ear and ear canal normal. Tympanic membrane is retracted. A middle ear effusion is present.  Nose: Mucosal edema present.  Mouth/Throat: Uvula is midline, oropharynx is clear and moist and mucous membranes are normal. No oropharyngeal exudate, posterior oropharyngeal edema or posterior oropharyngeal erythema.  Eyes: Conjunctivae and EOM are normal. Right eye exhibits no discharge. Left eye exhibits no discharge.  Neck: Neck supple. No tracheal deviation present.  Cardiovascular: Regular rhythm and normal heart sounds.  Tachycardia present.   No murmur heard. Pulmonary/Chest: Effort normal and breath sounds normal. No respiratory distress. She has no wheezes. She has no rales.  Decreased breath sounds in left lower lobe.  Musculoskeletal: Normal range of motion.  Lymphadenopathy:       Head (right side): Tonsillar adenopathy present. No submandibular, no preauricular, no posterior auricular and no occipital adenopathy present.       Head (left side): Tonsillar adenopathy present. No submandibular, no preauricular, no posterior auricular and no occipital adenopathy present.    She has no cervical adenopathy.       Right: No supraclavicular adenopathy present.       Left: No supraclavicular adenopathy present.  Neurological: She is alert and oriented to person, place, and time.  Skin: Skin is warm and dry.  Psychiatric: She has a normal mood and affect. Her behavior is normal.       Results for orders placed in visit on 11/28/13  POCT INFLUENZA A/B      Result Value Range   Influenza A, POC Positive     Influenza B, POC Negative     Assessment & Plan:  URI - Plan: POCT Influenza A/B, acetaminophen (TYLENOL) tablet 975 mg  Influenza A  Meds ordered this encounter  Medications  . acetaminophen (TYLENOL) tablet 975 mg    Sig:   . oseltamivir (TAMIFLU) 75 MG capsule    Sig: Take 1 capsule (75 mg total) by  mouth 2 (two) times daily.    Dispense:  10 capsule    Refill:  0  . chlorpheniramine-HYDROcodone (TUSSIONEX PENNKINETIC ER) 10-8 MG/5ML LQCR    Sig: Take 5 mLs by mouth every 12 (twelve) hours as needed.    Dispense:  120 mL    Refill:  0  . ipratropium (ATROVENT) 0.03 % nasal spray    Sig: Place 2 sprays into the nose 4 (four) times daily.    Dispense:  30 mL    Refill:  1    I personally performed the services described in this documentation, which was scribed in my presence. The recorded information has been reviewed and considered, and addended by me as needed.  Delman Cheadle, MD MPH

## 2014-01-29 ENCOUNTER — Other Ambulatory Visit (INDEPENDENT_AMBULATORY_CARE_PROVIDER_SITE_OTHER): Payer: BC Managed Care – PPO

## 2014-01-29 DIAGNOSIS — E785 Hyperlipidemia, unspecified: Secondary | ICD-10-CM

## 2014-01-29 LAB — HEPATIC FUNCTION PANEL
ALK PHOS: 66 U/L (ref 39–117)
ALT: 17 U/L (ref 0–35)
AST: 15 U/L (ref 0–37)
Albumin: 4 g/dL (ref 3.5–5.2)
BILIRUBIN TOTAL: 0.5 mg/dL (ref 0.3–1.2)
Bilirubin, Direct: 0 mg/dL (ref 0.0–0.3)
Total Protein: 7.3 g/dL (ref 6.0–8.3)

## 2014-01-29 LAB — LIPID PANEL
CHOL/HDL RATIO: 4
Cholesterol: 187 mg/dL (ref 0–200)
HDL: 48.6 mg/dL (ref >39.00)
LDL Cholesterol: 115 mg/dL — ABNORMAL HIGH (ref 0–99)
TRIGLYCERIDES: 119 mg/dL (ref 0.0–149.0)
VLDL: 23.8 mg/dL (ref 0.0–40.0)

## 2014-01-31 ENCOUNTER — Other Ambulatory Visit: Payer: BC Managed Care – PPO

## 2014-02-07 ENCOUNTER — Ambulatory Visit (INDEPENDENT_AMBULATORY_CARE_PROVIDER_SITE_OTHER): Payer: BC Managed Care – PPO | Admitting: Internal Medicine

## 2014-02-07 ENCOUNTER — Encounter: Payer: Self-pay | Admitting: Internal Medicine

## 2014-02-07 VITALS — BP 130/90 | HR 76 | Temp 99.2°F | Ht 66.5 in | Wt 224.0 lb

## 2014-02-07 DIAGNOSIS — R7309 Other abnormal glucose: Secondary | ICD-10-CM

## 2014-02-07 DIAGNOSIS — I10 Essential (primary) hypertension: Secondary | ICD-10-CM

## 2014-02-07 DIAGNOSIS — E785 Hyperlipidemia, unspecified: Secondary | ICD-10-CM

## 2014-02-07 MED ORDER — AMMONIUM LACTATE 12 % EX CREA: TOPICAL_CREAM | CUTANEOUS | Status: DC | PRN

## 2014-02-07 NOTE — Progress Notes (Signed)
Subjective:    Patient ID: Kara Cannon, female    DOB: 06-19-1971, 43 y.o.   MRN: [id number]  HPI  43 year old African American female with history of hypertension, abnormal glucose and hyperlipidemia for routine followup. Patient denies any significant interval medical history. She has been under a tremendous amount of stress recently. Her mother-in-law and mother recently passed away.  Patient reports altering her diet and consuming less carbohydrates.  Hyperlipidemia-she is tolerating pravastatin without difficulty.  Hypertension - stable   Review of Systems No weight change, no exertional chest pain.    Past Medical History  Diagnosis Date  . Effusion of ankle and foot joint 06/22/2009  . CHICKENPOX, HX OF 06/22/2009  . Cough 09/17/2009  . EPISCLERITIS 06/22/2009  . Headache(784.0) 06/22/2009  . HEMORRHOIDS, INTERNAL 06/23/2003  . HYPERTENSION 06/22/2009  . MIGRAINE HEADACHE 06/22/2009  . PLANTAR WART, LEFT 08/03/2009  . Unspecified vitamin D deficiency 06/22/2009  . Urinary frequency 06/22/2009  . UTI'S, HX OF 06/22/2009  . Premature ovarian failure   . IC (interstitial cystitis)   . CARDIAC MURMUR 06/22/2009    Mitral regurgitation  . Hyperlipidemia   . Allergy     History   Social History  . Marital Status: Married    Spouse Name: N/A    Number of Children: N/A  . 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 Smoking status: Never Smoker   . Smokeless tobacco: Not on file     Comment: Married  . Alcohol Use: No  . Drug Use: No  . Sexual Activity: Yes    Birth Control/ Protection: Post-menopausal   Other Topics Concern  . Not on file   Social History Narrative  . No narrative on file    Past Surgical History  Procedure Laterality Date  . Cystoscopy  02/2009    Dr. Jeffie Pollock    Family History  Problem Relation Age of Onset  . Skin cancer Maternal Uncle   . Hypertension Mother    . Hypertension Father   . Ovarian cancer Paternal Grandmother   . Cancer Paternal Grandmother   . Diabetes Maternal Grandmother   . Hyperlipidemia Maternal Grandmother     Allergies  Allergen Reactions  . Corn-Containing Products   . Ibuprofen   . Naproxen Sodium   . Peanut-Containing Drug Products   . Penicillins   . Sulfonamide Derivatives     Current Outpatient Prescriptions on File Prior to Visit  Medication Sig Dispense Refill  . Ascorbic Acid (VITAMIN C PO) Take by mouth.        Marland Kitchen aspirin-acetaminophen-caffeine (EXCEDRIN MIGRAINE) 250-250-65 MG per tablet Take 1 tablet by mouth every 6 (six) hours as needed for pain.      Marland Kitchen b complex vitamins tablet Take 1 tablet by mouth daily.      Marland Kitchen BIOTIN PO Take by mouth.        . chlorpheniramine-HYDROcodone (TUSSIONEX PENNKINETIC ER) 10-8 MG/5ML LQCR Take 5 mLs by mouth every 12 (twelve) hours as needed.  120 mL  0  . Cholecalciferol (VITAMIN D) 2000 UNITS CAPS Take by mouth daily.        . clotrimazole-betamethasone (LOTRISONE) cream Apply topically 2 (two) times daily.  30 g  2  . Fennel Oil OIL by Does not apply route.      . hydrochlorothiazide (HYDRODIURIL) 12.5 MG tablet Take 1 tablet (12.5 mg total)  by mouth daily.  90 tablet  1  . ipratropium (ATROVENT) 0.03 % nasal spray Place 2 sprays into the nose 4 (four) times daily.  30 mL  1  . Multiple Vitamin (MULTIVITAMIN) tablet Take 1 tablet by mouth daily.        . potassium chloride (K-DUR) 10 MEQ tablet Take 1 tablet (10 mEq total) by mouth 2 (two) times daily.  180 tablet  1  . pravastatin (PRAVACHOL) 40 MG tablet Take 1 tablet (40 mg total) by mouth daily.  90 tablet  1  . traMADol (ULTRAM) 50 MG tablet Take 1 tablet (50 mg total) by mouth every 8 (eight) hours as needed for pain.  30 tablet  1  . TURMERIC CURCUMIN PO Take 1 tablet by mouth daily.       No current facility-administered medications on file prior to visit.    BP 130/90  Pulse 76  Temp(Src) 99.2 F (37.3  C) (Oral)  Ht 5' 6.5" (1.689 m)  Wt 224 lb (101.606 kg)  BMI 35.62 kg/m2    Objective:   Physical Exam  Constitutional: She is oriented to person, place, and time. She appears well-developed and well-nourished. No distress.  Cardiovascular: Normal rate, regular rhythm and normal heart sounds.   No murmur heard. Pulmonary/Chest: Effort normal and breath sounds normal. She has no wheezes.  Musculoskeletal: She exhibits no edema.  Neurological: She is alert and oriented to person, place, and time.  Psychiatric: She has a normal mood and affect. Her behavior is normal.          Assessment & Plan:

## 2014-02-07 NOTE — Assessment & Plan Note (Signed)
Stable. Continue same dose of pravastatin. Lab Results  Component Value Date   CHOL 187 01/29/2014   HDL 48.60 01/29/2014   LDLCALC 115* 01/29/2014   LDLDIRECT 140.2 09/23/2013   TRIG 119.0 01/29/2014   CHOLHDL 4 01/29/2014   Lab Results  Component Value Date   ALT 17 01/29/2014   AST 15 01/29/2014   ALKPHOS 66 01/29/2014   BILITOT 0.5 01/29/2014

## 2014-02-07 NOTE — Assessment & Plan Note (Signed)
Well controlled.  Continue HCTZ 12.5 once daily. BP: 130/90 mmHg  Lab Results  Component Value Date   CREATININE 0.8 09/23/2013

## 2014-02-07 NOTE — Progress Notes (Signed)
Pre visit review using our clinic review tool, if applicable. No additional management support is needed unless otherwise documented below in the visit note. 

## 2014-02-07 NOTE — Assessment & Plan Note (Signed)
Monitor A1c.  Continue lifestyle changes.

## 2014-02-07 NOTE — Patient Instructions (Addendum)
Limit your carbohydrates to 30 grams per meal Start resistance training exercises. Please complete the following lab tests before your next follow up appointment: BMET, A1c - 790.29

## 2014-02-08 LAB — BASIC METABOLIC PANEL
BUN: 13 mg/dL (ref 6–23)
CO2: 28 mEq/L (ref 19–32)
CREATININE: 0.67 mg/dL (ref 0.50–1.10)
Calcium: 9.1 mg/dL (ref 8.4–10.5)
Chloride: 106 mEq/L (ref 96–112)
Glucose, Bld: 90 mg/dL (ref 70–99)
Potassium: 3.9 mEq/L (ref 3.5–5.3)
SODIUM: 143 meq/L (ref 135–145)

## 2014-02-08 LAB — HEMOGLOBIN A1C
HEMOGLOBIN A1C: 6.2 % — AB (ref <5.7)
Mean Plasma Glucose: 131 mg/dL — ABNORMAL HIGH (ref <117)

## 2014-02-10 ENCOUNTER — Telehealth: Payer: Self-pay | Admitting: Internal Medicine

## 2014-02-10 NOTE — Telephone Encounter (Signed)
Relevant patient education assigned to patient using Emmi. ° °

## 2014-03-25 ENCOUNTER — Ambulatory Visit: Payer: BC Managed Care – PPO

## 2014-03-25 ENCOUNTER — Ambulatory Visit (INDEPENDENT_AMBULATORY_CARE_PROVIDER_SITE_OTHER): Payer: BC Managed Care – PPO | Admitting: Family Medicine

## 2014-03-25 VITALS — BP 120/86 | HR 84 | Temp 98.7°F | Resp 16 | Ht 66.0 in | Wt 228.0 lb

## 2014-03-25 DIAGNOSIS — I10 Essential (primary) hypertension: Secondary | ICD-10-CM

## 2014-03-25 DIAGNOSIS — I499 Cardiac arrhythmia, unspecified: Secondary | ICD-10-CM

## 2014-03-25 DIAGNOSIS — R1012 Left upper quadrant pain: Secondary | ICD-10-CM

## 2014-03-25 LAB — POCT CBC
Granulocyte percent: 57.5 %G (ref 37–80)
HCT, POC: 40.9 % (ref 37.7–47.9)
Hemoglobin: 12.8 g/dL (ref 12.2–16.2)
Lymph, poc: 2.8 (ref 0.6–3.4)
MCH, POC: 29.4 pg (ref 27–31.2)
MCHC: 31.3 g/dL — AB (ref 31.8–35.4)
MCV: 93.8 fL (ref 80–97)
MID (cbc): 0.9 (ref 0–0.9)
MPV: 9 fL (ref 0–99.8)
POC Granulocyte: 4.9 (ref 2–6.9)
POC LYMPH PERCENT: 32.6 %L (ref 10–50)
POC MID %: 9.9 %M (ref 0–12)
Platelet Count, POC: 391 10*3/uL (ref 142–424)
RBC: 4.36 M/uL (ref 4.04–5.48)
RDW, POC: 14.7 %
WBC: 8.6 10*3/uL (ref 4.6–10.2)

## 2014-03-25 LAB — POCT URINALYSIS DIPSTICK
Bilirubin, UA: NEGATIVE
Glucose, UA: NEGATIVE
Ketones, UA: NEGATIVE
Leukocytes, UA: NEGATIVE
Nitrite, UA: NEGATIVE
Protein, UA: NEGATIVE
Spec Grav, UA: 1.025
Urobilinogen, UA: 0.2
pH, UA: 5.5

## 2014-03-25 LAB — POCT UA - MICROSCOPIC ONLY
Bacteria, U Microscopic: NEGATIVE
Casts, Ur, LPF, POC: NEGATIVE
Crystals, Ur, HPF, POC: NEGATIVE
Mucus, UA: NEGATIVE
Yeast, UA: NEGATIVE

## 2014-03-25 NOTE — Progress Notes (Signed)
Subjective:    Patient ID: Kara Cannon, female    DOB: 05/31/1971, 43 y.o.   MRN: [id number]  This chart was scribed for Kara Haber, MD by Erling Conte, Medical Scribe. This patient was seen in room Room 1 and the patient's care was started at 12:54 PM.   HPI     Review of Systems     Objective:   Physical Exam   Patient ID: Kara Cannon MRN: [id number], DOB: 03/19/1971, 43 y.o. Date of Encounter: 03/25/2014, 12:56 PM  Primary Physician: Drema Pry, DO  Chief Complaint: Flank Pain and Neck Pain  HPI:  HPI Comments: Kara Cannon is a 43 y.o. female with a history of HTN who presents to the [facility] complaining of neck pain.She states with the neck pain she is having associated headaches and otalgia. Patient is also having a secondary complain of flank pain. Patient states that she is been having trouble producing bowel movements which is unusual for her. Patient also notes that she feels like she has been having an irregular heartbeat. Patient denies any irregular urinary symptoms.  She notes that she has not been perfectly compliant with her blood pressure meds Patient works at [employer] The neck pain is located just below her ears bilaterally  Review of Systems:  Consitutional: No fever, chills, fatigue, night sweats, lymphadenopathy, or weight changes. Eyes: No visual changes, eye redness, or discharge. ENT/Mouth: Ears: No otalgia, tinnitus, hearing loss, discharge. Nose: No congestion, rhinorrhea, sinus pain, or epistaxis. Throat: No sore throat, post nasal drip, or teeth pain. Cardiovascular: No CP, palpitations, diaphoresis, DOE, edema, orthopnea, PND. Respiratory: No cough, hemoptysis, SOB, or wheezing. Gastrointestinal: No anorexia, dysphagia, reflux, pain, nausea, vomiting, hematemesis, diarrhea, constipation, BRBPR, or melena. Breast: No discharge, pain, swelling, or mass. Genitourinary: No dysuria, frequency,  urgency, hematuria, incontinence, nocturia, amenorrhea, vaginal discharge, pruritis, burning, abnormal bleeding, or pain. Musculoskeletal: No decreased ROM, myalgias, stiffness, joint swelling, or weakness. Skin: No rash, erythema, lesion changes, pain, warmth, jaundice, or pruritis. Neurological: No headache, dizziness, syncope, seizures, tremors, memory loss, coordination problems, or paresthesias. Psychological: No anxiety, depression, hallucinations, SI/HI. Endocrine: No fatigue, polydipsia, polyphagia, polyuria, or known diabetes. All other systems were reviewed and are otherwise negative.  Past Medical History  Diagnosis Date   Effusion of ankle and foot joint 06/22/2009   CHICKENPOX, HX OF 06/22/2009   Cough 09/17/2009   EPISCLERITIS 06/22/2009   Headache(784.0) 06/22/2009   HEMORRHOIDS, INTERNAL 06/23/2003   HYPERTENSION 06/22/2009   MIGRAINE HEADACHE 06/22/2009   PLANTAR WART, LEFT 08/03/2009   Unspecified vitamin D deficiency 06/22/2009   Urinary frequency 06/22/2009   UTI'S, HX OF 06/22/2009   Premature ovarian failure    IC (interstitial cystitis)    CARDIAC MURMUR 06/22/2009    Mitral regurgitation   Hyperlipidemia    Allergy      Past Surgical History  Procedure Laterality Date   Cystoscopy  02/2009    Dr. Jeffie Pollock    Home Meds:  Prior to Admission medications   Medication Sig Start Date End Date Taking? Authorizing Provider  ammonium lactate (LAC-HYDRIN) 12 % cream Apply topically as needed for dry skin. 02/07/14  Yes Doe-Hyun Kyra Searles, DO  aspirin-acetaminophen-caffeine (EXCEDRIN MIGRAINE) [phone] MG per tablet Take 1 tablet by mouth every 6 (six) hours as needed for pain.   Yes Historical Provider, MD  BIOTIN PO Take by mouth.     Yes Historical Provider, MD  chlorpheniramine-HYDROcodone Amanda Cockayne PENNKINETIC ER) 10-8 MG/5ML  LQCR Take 5 mLs by mouth every 12 (twelve) hours as needed. 11/28/13  Yes Shawnee Knapp, MD  hydrochlorothiazide (HYDRODIURIL) 12.5 MG tablet Take 1  tablet (12.5 mg total) by mouth daily. 05/15/13  Yes Doe-Hyun Kyra Searles, DO  Multiple Vitamin (MULTIVITAMIN) tablet Take 1 tablet by mouth daily.     Yes Historical Provider, MD  potassium chloride (K-DUR) 10 MEQ tablet Take 1 tablet (10 mEq total) by mouth 2 (two) times daily. 05/15/13  Yes Doe-Hyun R Shawna Orleans, DO  pravastatin (PRAVACHOL) 40 MG tablet Take 1 tablet (40 mg total) by mouth daily. 10/04/13  Yes Doe-Hyun R Shawna Orleans, DO  traMADol (ULTRAM) 50 MG tablet Take 1 tablet (50 mg total) by mouth every 8 (eight) hours as needed for pain. 07/05/13  Yes Doe-Hyun Kyra Searles, DO  clotrimazole-betamethasone (LOTRISONE) cream Apply topically 2 (two) times daily. 10/04/13   Doe-Hyun R Shawna Orleans, DO  ipratropium (ATROVENT) 0.03 % nasal spray Place 2 sprays into the nose 4 (four) times daily. 11/28/13   Shawnee Knapp, MD    Allergies:  Allergies  Allergen Reactions   Corn-Containing Products    Ibuprofen    Naproxen Sodium    Peanut-Containing Drug Products    Penicillins    Sulfonamide Derivatives     History   Social History   Marital Status: Married    Spouse Name: N/A    Number of Children: N/A   Years of Education: N/A   Occupational History   New Surveyor, mining   Social History Main Topics   Smoking status: Never Smoker    Smokeless tobacco: Not on file     Comment: Married   Alcohol Use: No   Drug Use: No   Sexual Activity: Yes    Museum/gallery curator: Post-menopausal   Other Topics Concern   Not on file   Social History Narrative   No narrative on file    Family History  Problem Relation Age of Onset   Skin cancer Maternal Uncle    Hypertension Mother    Hypertension Father    Ovarian cancer Paternal Grandmother    Cancer Paternal Grandmother    Diabetes Maternal Grandmother    Hyperlipidemia Maternal Grandmother     Physical Exam:  Blood pressure 120/86, pulse 84, temperature 98.7 F (37.1 C), temperature source Oral, resp. rate  16, height 5\' 6"  (1.676 m), weight 228 lb (103.42 kg), SpO2 99.00%., Body mass index is 36.82 kg/(m^2). Wt Readings from Last 3 Encounters:  03/25/14 228 lb (103.42 kg)  02/07/14 224 lb (101.606 kg)  11/28/13 223 lb (101.152 kg)   BP Readings from Last 3 Encounters:  03/25/14 120/86  02/07/14 130/90  11/28/13 128/82   General: Well developed, well nourished, in no acute distress. HEENT: Normocephalic, atraumatic. Conjunctiva pink, sclera non-icteric. Pupils 2 mm constricting to 1 mm, round, regular, and equally reactive to light and accomodation. EOMI. Internal auditory canal clear. TMs with good cone of light and without pathology. Nasal mucosa pink. Nares are without discharge. No sinus tenderness. Oral mucosa pink. Dentition normal. Pharynx without exudate.   Neck: Supple. Trachea midline. No thyromegaly. No bruits Full ROM. No lymphadenopathy. Lungs: Clear to auscultation bilaterally without wheezes, rales, or rhonchi. Breathing is of normal effort and unlabored. Cardiovascular: Patient has a skipped beat about every 5 beats.  No murmurs, rubs, or gallops appreciated. Distal pulses 2+ symmetrically. No carotid or abdominal bruits. Breast: Symmetrical. No masses.  Abdomen: Soft, tenderness in  LUQ, non-distended with normoactive bowel sounds. No hepatosplenomegaly or masses. No rebound/guarding. No CVA tenderness. Without hernias.  Musculoskeletal: Full range of motion and 5/5 strength throughout. Without swelling, atrophy, tenderness, crepitus, or warmth. Extremities without clubbing, cyanosis, or edema. Calves supple. Skin: Warm and moist without erythema, ecchymosis, wounds, or rash. Neuro: A+Ox3. CN II-XII grossly intact. Moves all extremities spontaneously. Full sensation throughout. Normal gait. DTR 2+ throughout upper and lower extremities. Finger to nose intact. Psych:  Responds to questions appropriately with a normal affect.   Lab Results  Component Value Date   CHOL 187  01/29/2014   HDL 48.60 01/29/2014   LDLCALC 115* 01/29/2014   LDLDIRECT 140.2 09/23/2013   TRIG 119.0 01/29/2014   CHOLHDL 4 01/29/2014   Results for orders placed in visit on 21/30/86  BASIC METABOLIC PANEL      Result Value Ref Range   Sodium 143  135 - 145 mEq/L   Potassium 3.9  3.5 - 5.3 mEq/L   Chloride 106  96 - 112 mEq/L   CO2 28  19 - 32 mEq/L   Glucose, Bld 90  70 - 99 mg/dL   BUN 13  6 - 23 mg/dL   Creat 0.67  0.50 - 1.10 mg/dL   Calcium 9.1  8.4 - 10.5 mg/dL  HEMOGLOBIN A1C      Result Value Ref Range   Hemoglobin A1C 6.2 (*) <5.7 %   Mean Plasma Glucose 131 (*) <117 mg/dL   Results for orders placed in visit on 03/25/14  POCT UA - MICROSCOPIC ONLY      Result Value Ref Range   WBC, Ur, HPF, POC 0-1     RBC, urine, microscopic 0-2     Bacteria, U Microscopic neg     Mucus, UA neg     Epithelial cells, urine per micros 0-2     Crystals, Ur, HPF, POC neg     Casts, Ur, LPF, POC neg     Yeast, UA neg    POCT URINALYSIS DIPSTICK      Result Value Ref Range   Color, UA yellow     Clarity, UA clear     Glucose, UA neg     Bilirubin, UA neg     Ketones, UA neg     Spec Grav, UA 1.025     Blood, UA small     pH, UA 5.5     Protein, UA neg     Urobilinogen, UA 0.2     Nitrite, UA neg     Leukocytes, UA Negative    POCT CBC      Result Value Ref Range   WBC 8.6  4.6 - 10.2 K/uL   Lymph, poc 2.8  0.6 - 3.4   POC LYMPH PERCENT 32.6  10 - 50 %L   MID (cbc) 0.9  0 - 0.9   POC MID % 9.9  0 - 12 %M   POC Granulocyte 4.9  2 - 6.9   Granulocyte percent 57.5  37 - 80 %G   RBC 4.36  4.04 - 5.48 M/uL   Hemoglobin 12.8  12.2 - 16.2 g/dL   HCT, POC 40.9  37.7 - 47.9 %   MCV 93.8  80 - 97 fL   MCH, POC 29.4  27 - 31.2 pg   MCHC 31.3 (*) 31.8 - 35.4 g/dL   RDW, POC 14.7     Platelet Count, POC 391  142 - 424 K/uL   MPV 9.0  0 - 99.8 fL   EKG: Occasional PVC, no acute changes [facility] reading (PRIMARY) by  Dr. Joseph Art:  Unspecific gas pattern, normal  chest.   Assessment/Plan:  43 y.o. y/o female here for left-sided abdominal pain and dull bilateral mastoid pain  The patient has some constipation which would account for her left-sided abdominal pain. Not sure why she has the mastoid type pain.  Unwilling to recommend the patient use some MiraLax, resume her normal blood pressure routine. -  Signed, Kara Haber, MD 03/25/2014 12:56 PM          Assessment & Plan:

## 2014-03-25 NOTE — Patient Instructions (Signed)
Irregular heart beat is very occasional now and not a significant rest your health. This particular kind of arrhythmias call PVC or premature ventricular contraction. Is seen in many people and usually last for a week or 2 then disappears.  The abdominal pain is most likely related to some mild constipation. I think he takes MiraLax this will resolve without any further worries.  Finally the dull ache in her neck by your lower ears is probably related to some elevated blood pressure. I think he can get back on regular routine blood pressure medicine this will be adequately managed.  If any of these symptoms return or become unexplained by your normal routine, please return and will followup.

## 2014-03-30 ENCOUNTER — Ambulatory Visit: Payer: BC Managed Care – PPO

## 2014-03-30 ENCOUNTER — Ambulatory Visit (INDEPENDENT_AMBULATORY_CARE_PROVIDER_SITE_OTHER): Payer: BC Managed Care – PPO | Admitting: Family Medicine

## 2014-03-30 VITALS — BP 134/84 | HR 88 | Temp 99.7°F | Resp 16 | Ht 66.0 in | Wt 228.0 lb

## 2014-03-30 DIAGNOSIS — S93609A Unspecified sprain of unspecified foot, initial encounter: Secondary | ICD-10-CM

## 2014-03-30 DIAGNOSIS — M79674 Pain in right toe(s): Secondary | ICD-10-CM

## 2014-03-30 DIAGNOSIS — M79609 Pain in unspecified limb: Secondary | ICD-10-CM

## 2014-03-30 DIAGNOSIS — S93509A Unspecified sprain of unspecified toe(s), initial encounter: Secondary | ICD-10-CM

## 2014-03-30 NOTE — Progress Notes (Signed)
   Subjective:    Patient ID: Kara Cannon, female    DOB: [birth year], 43 y.o.   MRN: [id number]  HPI 43 year old female presents for evaluation of right toe pain s/p a fall in the shower this morning.  States she slipped and while falling her 2nd and 3rd toes curled under and hyperflexed.  No head injury or LOC.  Did land on her back and admits it is a bit sore but not nearly as painful as her toes.  Admits as the day has progressed, she has had increasing pain in her foot along with limited ROM secondary to pain. Slight numb "burning" sensation but no weakness.  No hx of previous fracture.  Patient is otherwise doing well with no other concerns today.     Review of Systems  Musculoskeletal: Positive for gait problem (limping) and joint swelling.  Skin: Positive for color change. Negative for wound.  Neurological: Positive for numbness. Negative for weakness.       Objective:   Physical Exam  Constitutional: She is oriented to person, place, and time. She appears well-developed and well-nourished.  HENT:  Head: Normocephalic and atraumatic.  Right Ear: External ear normal.  Left Ear: External ear normal.  Eyes: Conjunctivae are normal.  Neck: Normal range of motion.  Cardiovascular: Normal rate.   Pulmonary/Chest: Effort normal.  Musculoskeletal:  Right 2nd and 3rd toes +TTP at IP joint, 2nd>3rd. No swelling or ecchymosis.  Capillary refill normal <2 seconds.  Sensation intact  Neurological: She is alert and oriented to person, place, and time.  Psychiatric: She has a normal mood and affect. Her behavior is normal. Judgment and thought content normal.      [facility] reading (PRIMARY) by  Dr. Marin Comment as no acute bony abnormality.     Assessment & Plan:  Toe pain, right - Plan: DG Foot Complete Right  Sprain of toe  Placed in post of shoe for comfort Continue to wear as needed until pain improves. May d/c once tolerated.  Follow up if symptoms worsen or fail to improve.

## 2014-04-15 ENCOUNTER — Encounter: Payer: Self-pay | Admitting: Family Medicine

## 2014-04-15 NOTE — Telephone Encounter (Signed)
What is the plan with her? please advise.

## 2014-04-18 ENCOUNTER — Telehealth: Payer: Self-pay | Admitting: Internal Medicine

## 2014-04-18 MED ORDER — HYDROCHLOROTHIAZIDE 12.5 MG PO TABS: 12.5000 mg | ORAL_TABLET | Freq: Every day | ORAL | Status: DC

## 2014-04-18 NOTE — Telephone Encounter (Signed)
rx sent in electronically 

## 2014-04-18 NOTE — Telephone Encounter (Signed)
Pt needs new rx hctz 12.5mg  #30 only call into [facility]. Pt is waiting on mailorder. Pt is out

## 2014-04-29 ENCOUNTER — Other Ambulatory Visit: Payer: Self-pay | Admitting: Gastroenterology

## 2014-04-29 DIAGNOSIS — R110 Nausea: Secondary | ICD-10-CM

## 2014-05-19 ENCOUNTER — Ambulatory Visit (HOSPITAL_COMMUNITY): Payer: BC Managed Care – PPO

## 2014-05-19 ENCOUNTER — Encounter (HOSPITAL_COMMUNITY): Payer: BC Managed Care – PPO

## 2014-09-22 ENCOUNTER — Ambulatory Visit (INDEPENDENT_AMBULATORY_CARE_PROVIDER_SITE_OTHER): Payer: BC Managed Care – PPO | Admitting: Internal Medicine

## 2014-09-22 ENCOUNTER — Ambulatory Visit (INDEPENDENT_AMBULATORY_CARE_PROVIDER_SITE_OTHER): Payer: BC Managed Care – PPO

## 2014-09-22 VITALS — BP 142/80 | HR 95 | Temp 99.7°F | Resp 18 | Ht 67.0 in | Wt 219.0 lb

## 2014-09-22 DIAGNOSIS — E038 Other specified hypothyroidism: Secondary | ICD-10-CM | POA: Insufficient documentation

## 2014-09-22 DIAGNOSIS — R519 Headache, unspecified: Secondary | ICD-10-CM

## 2014-09-22 DIAGNOSIS — R1030 Lower abdominal pain, unspecified: Secondary | ICD-10-CM

## 2014-09-22 DIAGNOSIS — E039 Hypothyroidism, unspecified: Secondary | ICD-10-CM

## 2014-09-22 DIAGNOSIS — R300 Dysuria: Secondary | ICD-10-CM

## 2014-09-22 DIAGNOSIS — R110 Nausea: Secondary | ICD-10-CM

## 2014-09-22 DIAGNOSIS — K5900 Constipation, unspecified: Secondary | ICD-10-CM

## 2014-09-22 DIAGNOSIS — R51 Headache: Secondary | ICD-10-CM

## 2014-09-22 HISTORY — DX: Other specified hypothyroidism: E03.8

## 2014-09-22 LAB — POCT CBC
GRANULOCYTE PERCENT: 63.2 % (ref 37–80)
HEMATOCRIT: 40 % (ref 37.7–47.9)
Hemoglobin: 12.7 g/dL (ref 12.2–16.2)
Lymph, poc: 3.1 (ref 0.6–3.4)
MCH, POC: 29.7 pg (ref 27–31.2)
MCHC: 31.7 g/dL — AB (ref 31.8–35.4)
MCV: 93.8 fL (ref 80–97)
MID (cbc): 0.5 (ref 0–0.9)
MPV: 7.9 fL (ref 0–99.8)
POC Granulocyte: 6.2 (ref 2–6.9)
POC LYMPH PERCENT: 31.5 %L (ref 10–50)
POC MID %: 5.3 %M (ref 0–12)
Platelet Count, POC: 405 10*3/uL (ref 142–424)
RBC: 4.27 M/uL (ref 4.04–5.48)
RDW, POC: 14.7 %
WBC: 9.8 10*3/uL (ref 4.6–10.2)

## 2014-09-22 LAB — POCT UA - MICROSCOPIC ONLY
CRYSTALS, UR, HPF, POC: NEGATIVE
Casts, Ur, LPF, POC: NEGATIVE
Mucus, UA: NEGATIVE
Yeast, UA: NEGATIVE

## 2014-09-22 LAB — POCT URINALYSIS DIPSTICK
BILIRUBIN UA: NEGATIVE
Glucose, UA: NEGATIVE
KETONES UA: NEGATIVE
LEUKOCYTES UA: NEGATIVE
Nitrite, UA: NEGATIVE
PH UA: 6
Protein, UA: NEGATIVE
SPEC GRAV UA: 1.025
Urobilinogen, UA: 0.2

## 2014-09-22 MED ORDER — POLYETHYLENE GLYCOL 3350 17 GM/SCOOP PO POWD: 1.0000 | Freq: Once | ORAL | Status: DC

## 2014-09-22 MED ORDER — DICYCLOMINE HCL 20 MG PO TABS: 20.0000 mg | ORAL_TABLET | Freq: Four times a day (QID) | ORAL | Status: DC

## 2014-09-22 NOTE — Progress Notes (Signed)
Subjective:    Patient ID: Kara Cannon, female    DOB: 03/13/1971, 43 y.o.   MRN: [id number]  HPI 43 YEAR OLD FEMALE WITH  CC ABDOMINAL PAIN  HPI ONSET 3 DAYS AGO IN THE AM AFTER EATING BEEF STROMBOLI  CONSTANT ACHING PAIN SINCE THIS AM, WAS INTERMITTENT BEFORE TODAY AWOKE IN THE AM 3 DAYS AGO WITH ABDOMINAL PAIN SEVERITY 5/10 BILATERAL LOWER ABDOMEN INCREASED ON THE LEFT  ASSOC WITH CONSTIPATION  ASSOC WITH NAUSEA NO VOMITING NO RADIATION NO FEVER  TOOK A ALXATIVE LAST NIGHT AND AGAIN THIS AM  HAS A  HAD A SMALL STOOL THIS AM AND FEELS LIKE SHE HAS TO GO AGAIN NOW. ALSO HAS HAD MILD DYSURIA   Review of Systems  Constitutional: Positive for appetite change. Negative for fever and chills.  HENT: Negative.   Eyes: Negative.   Respiratory: Negative.   Cardiovascular: Negative.   Gastrointestinal: Positive for nausea, abdominal pain, constipation and abdominal distention. Negative for vomiting and blood in stool.  Endocrine: Negative.   Genitourinary: Negative.   Musculoskeletal: Negative.   Skin: Negative.   Allergic/Immunologic: Negative.   Neurological: Negative.   Hematological: Negative.   Psychiatric/Behavioral: Negative.   All other systems reviewed and are negative. HAS HAD A COLONOSCOPY IN THE REMOTE PAST WHICH THE PATIENTS REPORTS AS NEGIVIVE     Objective:   Physical Exam  Constitutional: She is oriented to person, place, and time. She appears well-developed and well-nourished.  HENT:  Head: Normocephalic and atraumatic.  Nose: Nose normal.  Mouth/Throat: Oropharynx is clear and moist.  Eyes: Conjunctivae and EOM are normal. Pupils are equal, round, and reactive to light.  Neck: Normal range of motion. Neck supple.  Cardiovascular: Normal rate, regular rhythm, normal heart sounds and intact distal pulses.   No murmur heard. Pulmonary/Chest: Effort normal and breath sounds normal. No respiratory distress. She has no wheezes. She has no rales.    Abdominal: Soft. She exhibits no mass. There is tenderness. There is no rebound and no guarding.  TENDER LLQ AND RLQ NO REBOUND OR GUARDING SLIGHTLY MORE TENDER ON THE LEFT  Musculoskeletal: Normal range of motion.  Lymphadenopathy:    She has no cervical adenopathy.  Neurological: She is alert and oriented to person, place, and time.  Skin: Skin is warm and dry. No rash noted.  Psychiatric: She has a normal mood and affect. Her behavior is normal. Judgment and thought content normal.  Nursing note and vitals reviewed.   Results for orders placed or performed in visit on 09/22/14  POCT CBC  Result Value Ref Range   WBC 9.8 4.6 - 10.2 K/uL   Lymph, poc 3.1 0.6 - 3.4   POC LYMPH PERCENT 31.5 10 - 50 %L   MID (cbc) 0.5 0 - 0.9   POC MID % 5.3 0 - 12 %M   POC Granulocyte 6.2 2 - 6.9   Granulocyte percent 63.2 37 - 80 %G   RBC 4.27 4.04 - 5.48 M/uL   Hemoglobin 12.7 12.2 - 16.2 g/dL   HCT, POC 40.0 37.7 - 47.9 %   MCV 93.8 80 - 97 fL   MCH, POC 29.7 27 - 31.2 pg   MCHC 31.7 (A) 31.8 - 35.4 g/dL   RDW, POC 14.7 %   Platelet Count, POC 405 142 - 424 K/uL   MPV 7.9 0 - 99.8 fL  POCT UA - Microscopic Only  Result Value Ref Range   WBC, Ur, HPF, POC 0-2  RBC, urine, microscopic 0-2    Bacteria, U Microscopic trace    Mucus, UA neg    Epithelial cells, urine per micros 0-1    Crystals, Ur, HPF, POC neg    Casts, Ur, LPF, POC neg    Yeast, UA neg   POCT urinalysis dipstick  Result Value Ref Range   Color, UA yellow    Clarity, UA clear    Glucose, UA neg    Bilirubin, UA neg    Ketones, UA neg    Spec Grav, UA 1.025    Blood, UA trace    pH, UA 6.0    Protein, UA neg    Urobilinogen, UA 0.2    Nitrite, UA neg    Leukocytes, UA Negative   WBC IS NORMAL UA NEGITIVE FOR UTI TRACE BLOOD IN URINE WITH SLIGHT HIGH SPECIFIC  GRAVITY  [facility] reading (PRIMARY) by  Dr. Benjaman Lobe dilated loops o f bowel with few scattered air fluid levels and increased stool in the left colon  consistent with ileus. No free air no obstruction      Assessment & Plan:  43 YEAR OLD FEMALE WITH 3 DAY HISTORY OF DIFFUS ELOWER ABDOMINAL PAIN AN DSOME CONSTIPATION UNRESPONSICE TO LAXITIVES SHE TOOK.Marland Kitchen WILL CHECK CBC UA AND XRAY.wbc BS IS NORMAL WILL TREAT WITH LAXITIVES AND ANTISPASMOTICS.

## 2014-09-22 NOTE — Patient Instructions (Signed)
BENTYL AS DIRECTED IF NEEDED FOR PAIN. MIRALAX AS DIRECTED IF NEEDED FOR CONSTIPATION. RETURN TO THE OFFICE IF CONSTIPATION OR PAIN CONTINUES

## 2015-12-17 ENCOUNTER — Emergency Department (HOSPITAL_COMMUNITY)
Admission: EM | Admit: 2015-12-17 | Discharge: 2015-12-18 | Disposition: A | Discharge status: Home / Self Care | Payer: BLUE CROSS/BLUE SHIELD | Attending: Emergency Medicine | Admitting: Emergency Medicine

## 2015-12-17 ENCOUNTER — Encounter (HOSPITAL_COMMUNITY): Payer: Self-pay | Admitting: Emergency Medicine

## 2015-12-17 ENCOUNTER — Emergency Department (HOSPITAL_COMMUNITY): Payer: BLUE CROSS/BLUE SHIELD

## 2015-12-17 DIAGNOSIS — Z8669 Personal history of other diseases of the nervous system and sense organs: Secondary | ICD-10-CM | POA: Diagnosis not present

## 2015-12-17 DIAGNOSIS — Z8619 Personal history of other infectious and parasitic diseases: Secondary | ICD-10-CM | POA: Diagnosis not present

## 2015-12-17 DIAGNOSIS — Z79899 Other long term (current) drug therapy: Secondary | ICD-10-CM | POA: Insufficient documentation

## 2015-12-17 DIAGNOSIS — E785 Hyperlipidemia, unspecified: Secondary | ICD-10-CM | POA: Diagnosis not present

## 2015-12-17 DIAGNOSIS — Z880 Allergy status to penicillin: Secondary | ICD-10-CM | POA: Diagnosis not present

## 2015-12-17 DIAGNOSIS — Z87440 Personal history of urinary (tract) infections: Secondary | ICD-10-CM | POA: Insufficient documentation

## 2015-12-17 DIAGNOSIS — K5900 Constipation, unspecified: Secondary | ICD-10-CM | POA: Insufficient documentation

## 2015-12-17 DIAGNOSIS — M549 Dorsalgia, unspecified: Secondary | ICD-10-CM | POA: Insufficient documentation

## 2015-12-17 DIAGNOSIS — R109 Unspecified abdominal pain: Secondary | ICD-10-CM | POA: Insufficient documentation

## 2015-12-17 DIAGNOSIS — I10 Essential (primary) hypertension: Secondary | ICD-10-CM | POA: Diagnosis not present

## 2015-12-17 DIAGNOSIS — R011 Cardiac murmur, unspecified: Secondary | ICD-10-CM | POA: Diagnosis not present

## 2015-12-17 LAB — CBC
HEMATOCRIT: 39 % (ref 36.0–46.0)
Hemoglobin: 12.2 g/dL (ref 12.0–15.0)
MCH: 28.4 pg (ref 26.0–34.0)
MCHC: 31.3 g/dL (ref 30.0–36.0)
MCV: 90.7 fL (ref 78.0–100.0)
Platelets: 427 10*3/uL — ABNORMAL HIGH (ref 150–400)
RBC: 4.3 MIL/uL (ref 3.87–5.11)
RDW: 14.3 % (ref 11.5–15.5)
WBC: 7 10*3/uL (ref 4.0–10.5)

## 2015-12-17 LAB — URINALYSIS, ROUTINE W REFLEX MICROSCOPIC
BILIRUBIN URINE: NEGATIVE
GLUCOSE, UA: NEGATIVE mg/dL
Hgb urine dipstick: NEGATIVE
KETONES UR: NEGATIVE mg/dL
Leukocytes, UA: NEGATIVE
NITRITE: NEGATIVE
PH: 5 (ref 5.0–8.0)
Protein, ur: NEGATIVE mg/dL
Specific Gravity, Urine: 1.037 — ABNORMAL HIGH (ref 1.005–1.030)

## 2015-12-17 LAB — COMPREHENSIVE METABOLIC PANEL WITH GFR
ALT: 22 U/L (ref 14–54)
AST: 21 U/L (ref 15–41)
Albumin: 4.3 g/dL (ref 3.5–5.0)
Alkaline Phosphatase: 85 U/L (ref 38–126)
Anion gap: 11 (ref 5–15)
BUN: 16 mg/dL (ref 6–20)
CO2: 25 mmol/L (ref 22–32)
Calcium: 9.3 mg/dL (ref 8.9–10.3)
Chloride: 106 mmol/L (ref 101–111)
Creatinine, Ser: 0.86 mg/dL (ref 0.44–1.00)
GFR calc Af Amer: >60 mL/min
GFR calc non Af Amer: >60 mL/min
Glucose, Bld: 141 mg/dL — ABNORMAL HIGH (ref 65–99)
Potassium: 3.9 mmol/L (ref 3.5–5.1)
Sodium: 142 mmol/L (ref 135–145)
Total Bilirubin: 0.5 mg/dL (ref 0.3–1.2)
Total Protein: 8.1 g/dL (ref 6.5–8.1)

## 2015-12-17 LAB — LIPASE, BLOOD: Lipase: 39 U/L (ref 11–51)

## 2015-12-17 MED ORDER — ONDANSETRON HCL 4 MG/2ML IJ SOLN
4.0000 mg | Freq: Once | INTRAMUSCULAR | Status: AC
  Administered 2015-12-18: 4 mg via INTRAVENOUS
  Filled 2015-12-17: qty 2

## 2015-12-17 MED ORDER — MORPHINE SULFATE (PF) 4 MG/ML IV SOLN
4.0000 mg | Freq: Once | INTRAVENOUS | Status: AC
  Administered 2015-12-18: 4 mg via INTRAVENOUS
  Filled 2015-12-17: qty 1

## 2015-12-17 MED ORDER — SODIUM CHLORIDE 0.9 % IV SOLN
1000.0000 mL | INTRAVENOUS | Status: DC
  Administered 2015-12-18: 1000 mL via INTRAVENOUS

## 2015-12-17 MED ORDER — SODIUM CHLORIDE 0.9 % IV SOLN
1000.0000 mL | Freq: Once | INTRAVENOUS | Status: AC
  Administered 2015-12-18: 1000 mL via INTRAVENOUS

## 2015-12-17 NOTE — ED Provider Notes (Signed)
CSN: [id number]     Arrival date & time 12/17/15  2229 History  By signing my name below, I, Meriel Pica, attest that this documentation has been prepared under the direction and in the presence of Delora Fuel, MD. Electronically Signed: Meriel Pica, ED Scribe. 12/17/2015. 11:44 PM.   Chief Complaint  Patient presents with  . Back Pain   The history is provided by the patient. No language interpreter was used.   HPI Comments: Kara Cannon is a 45 y.o. female who presents to the Emergency Department complaining of a sudden onset, waxing and waning, sharp pain that originates in the right flank and radiates to the RLQ onset early last morning at 2:30 AM. Pt states the pain woke her up as a burning pain with the feeling that her back 'was on fire'. She notes no worsening factors but states tramadol and parafon provide temporary relief. She associates nausea and constipation. The pt was evaluated by her PCP this morning for the same complaint when a urinalysis was obtained and was unremarkable. Pt reports a h/o muscle spasms but denies this pain to be similar. The pt denies movement to be an exacerbating factor, and associated symptoms of vomiting, diarrhea, or difficulty urinating.   Past Medical History  Diagnosis Date  . Effusion of ankle and foot joint 06/22/2009  . CHICKENPOX, HX OF 06/22/2009  . Cough 09/17/2009  . EPISCLERITIS 06/22/2009  . Headache(784.0) 06/22/2009  . HEMORRHOIDS, INTERNAL 06/23/2003  . HYPERTENSION 06/22/2009  . MIGRAINE HEADACHE 06/22/2009  . PLANTAR WART, LEFT 08/03/2009  . Unspecified vitamin D deficiency 06/22/2009  . Urinary frequency 06/22/2009  . UTI'S, HX OF 06/22/2009  . Premature ovarian failure   . IC (interstitial cystitis)   . CARDIAC MURMUR 06/22/2009    Mitral regurgitation  . Hyperlipidemia   . Allergy    Past Surgical History  Procedure Laterality Date  . Cystoscopy  02/2009    Dr. Jeffie Pollock   Family History  Problem Relation Age of Onset  . Skin  cancer Maternal Uncle   . Hypertension Mother   . Hypertension Father   . Ovarian cancer Paternal Grandmother   . Cancer Paternal Grandmother   . Diabetes Maternal Grandmother   . Hyperlipidemia Maternal Grandmother    Social History  Substance Use Topics  . Smoking status: Never Smoker   . Smokeless tobacco: None     Comment: Married  . Alcohol Use: No   OB History    Gravida Para Term Preterm AB TAB SAB Ectopic Multiple Living   1    1     0     Review of Systems  Gastrointestinal: Positive for nausea, abdominal pain and constipation. Negative for vomiting and diarrhea.  Genitourinary: Positive for flank pain. Negative for difficulty urinating.  All other systems reviewed and are negative.  Allergies  Corn-containing products; Ibuprofen; Naproxen sodium; Peanut-containing drug products; Penicillins; and Sulfonamide derivatives  Home Medications   Prior to Admission medications   Medication Sig Start Date End Date Taking? Authorizing Provider  Black Currant Seed Oil 500 MG CAPS Take 1 capsule by mouth daily.   Yes Historical Provider, MD  chlorzoxazone (PARAFON) 500 MG tablet Take 500 mg by mouth 3 (three) times daily as needed for muscle spasms.   Yes Historical Provider, MD  co-enzyme Q-10 30 MG capsule Take 30 mg by mouth daily.   Yes Historical Provider, MD  Flaxseed, Linseed, (FLAXSEED OIL) 1000 MG CAPS Take 1 capsule by mouth daily.  Yes Historical Provider, MD  HYDROcodone-homatropine (HYCODAN) 5-1.5 MG/5ML syrup Take 5 mLs by mouth every 6 (six) hours as needed for cough.  12/14/15  Yes Historical Provider, MD  ibuprofen (ADVIL,MOTRIN) 600 MG tablet Take 600 mg by mouth every 6 (six) hours as needed for moderate pain.  11/07/15  Yes Historical Provider, MD  MYRBETRIQ 50 MG TB24 tablet Take 50 mg by mouth daily.  10/09/15  Yes Historical Provider, MD  pravastatin (PRAVACHOL) 20 MG tablet Take 20 mg by mouth at bedtime.  11/04/15  Yes Historical Provider, MD  Probiotic  Product (PROBIOTIC PO) Take 1 capsule by mouth daily.   Yes Historical Provider, MD  pseudoephedrine (SUDAFED) 30 MG tablet Take 30 mg by mouth every 4 (four) hours as needed for congestion.   Yes Historical Provider, MD  sodium chloride (OCEAN) 0.65 % SOLN nasal spray Place 1 spray into both nostrils daily as needed for congestion.   Yes Historical Provider, MD  SYNTHROID 25 MCG tablet Take 25 mcg by mouth daily before breakfast.  09/25/15  Yes Historical Provider, MD  traMADol (ULTRAM) 50 MG tablet Take 1 tablet (50 mg total) by mouth every 8 (eight) hours as needed for pain. 07/05/13  Yes Doe-Hyun R Shawna Orleans, DO  ammonium lactate (LAC-HYDRIN) 12 % cream Apply topically as needed for dry skin. Patient not taking: Reported on 12/17/2015 02/07/14   Doe-Hyun R Shawna Orleans, DO  chlorpheniramine-HYDROcodone (TUSSIONEX PENNKINETIC ER) 10-8 MG/5ML LQCR Take 5 mLs by mouth every 12 (twelve) hours as needed. Patient not taking: Reported on 12/17/2015 11/28/13   Shawnee Knapp, MD  clotrimazole-betamethasone (LOTRISONE) cream Apply topically 2 (two) times daily. Patient not taking: Reported on 12/17/2015 10/04/13   Doe-Hyun R Shawna Orleans, DO  dicyclomine (BENTYL) 20 MG tablet Take 1 tablet (20 mg total) by mouth every 6 (six) hours. Patient not taking: Reported on 12/17/2015 09/22/14   Boris Lown, DO  hydrochlorothiazide (HYDRODIURIL) 12.5 MG tablet Take 1 tablet (12.5 mg total) by mouth daily. Patient not taking: Reported on 12/17/2015 04/18/14   Doe-Hyun R Shawna Orleans, DO  ipratropium (ATROVENT) 0.03 % nasal spray Place 2 sprays into the nose 4 (four) times daily. Patient not taking: Reported on 12/17/2015 11/28/13   Shawnee Knapp, MD  polyethylene glycol powder (GLYCOLAX/MIRALAX) powder Take 255 g by mouth once. Patient not taking: Reported on 12/17/2015 09/22/14   Boris Lown, DO  potassium chloride (K-DUR) 10 MEQ tablet Take 1 tablet (10 mEq total) by mouth 2 (two) times daily. Patient not taking: Reported on 12/17/2015 05/15/13   Doe-Hyun  R Shawna Orleans, DO  pravastatin (PRAVACHOL) 40 MG tablet Take 1 tablet (40 mg total) by mouth daily. Patient not taking: Reported on 12/17/2015 10/04/13   Doe-Hyun R Shawna Orleans, DO   BP 160/88 mmHg  Pulse 100  Temp(Src) 98.6 F (37 C) (Oral)  Resp 16  SpO2 97% Physical Exam  Constitutional: She is oriented to person, place, and time. She appears well-developed and well-nourished.  HENT:  Head: Normocephalic and atraumatic.  Eyes: EOM are normal. Pupils are equal, round, and reactive to light.  Neck: Normal range of motion. Neck supple. No JVD present.  Cardiovascular: Normal rate, regular rhythm and normal heart sounds.   No murmur heard. Pulmonary/Chest: Effort normal and breath sounds normal. She has no wheezes. She has no rales. She exhibits no tenderness.  Abdominal: Soft. She exhibits no distension and no mass. There is no tenderness. There is CVA tenderness.  Mild right CVA tenderness; bowel sounds decreased.  Musculoskeletal: Normal range of motion. She exhibits no edema.  Negative straight leg raise bilaterally.   Lymphadenopathy:    She has no cervical adenopathy.  Neurological: She is alert and oriented to person, place, and time. No cranial nerve deficit. She exhibits normal muscle tone. Coordination normal.  Skin: Skin is warm and dry. No rash noted.  Psychiatric: She has a normal mood and affect. Her behavior is normal. Judgment and thought content normal.  Nursing note and vitals reviewed.   ED Course  Procedures  DIAGNOSTIC STUDIES: Oxygen Saturation is 97% on RA, normal by my interpretation.    COORDINATION OF CARE: 11:40 PM Discussed treatment plan which includes to order diagnostic labs and CT renal stone study with pt. Pt acknowledges and agrees to plan.    Labs Review Results for orders placed or performed during the hospital encounter of 12/17/15  Lipase, blood  Result Value Ref Range   Lipase 39 11 - 51 U/L  Comprehensive metabolic panel  Result Value Ref Range    Sodium 142 135 - 145 mmol/L   Potassium 3.9 3.5 - 5.1 mmol/L   Chloride 106 101 - 111 mmol/L   CO2 25 22 - 32 mmol/L   Glucose, Bld 141 (H) 65 - 99 mg/dL   BUN 16 6 - 20 mg/dL   Creatinine, Ser 0.86 0.44 - 1.00 mg/dL   Calcium 9.3 8.9 - 10.3 mg/dL   Total Protein 8.1 6.5 - 8.1 g/dL   Albumin 4.3 3.5 - 5.0 g/dL   AST 21 15 - 41 U/L   ALT 22 14 - 54 U/L   Alkaline Phosphatase 85 38 - 126 U/L   Total Bilirubin 0.5 0.3 - 1.2 mg/dL   GFR calc non Af Amer >60 >60 mL/min   GFR calc Af Amer >60 >60 mL/min   Anion gap 11 5 - 15  CBC  Result Value Ref Range   WBC 7.0 4.0 - 10.5 K/uL   RBC 4.30 3.87 - 5.11 MIL/uL   Hemoglobin 12.2 12.0 - 15.0 g/dL   HCT 39.0 36.0 - 46.0 %   MCV 90.7 78.0 - 100.0 fL   MCH 28.4 26.0 - 34.0 pg   MCHC 31.3 30.0 - 36.0 g/dL   RDW 14.3 11.5 - 15.5 %   Platelets 427 (H) 150 - 400 K/uL  Urinalysis, Routine w reflex microscopic (not at [facility])  Result Value Ref Range   Color, Urine YELLOW YELLOW   APPearance CLOUDY (A) CLEAR   Specific Gravity, Urine 1.037 (H) 1.005 - 1.030   pH 5.0 5.0 - 8.0   Glucose, UA NEGATIVE NEGATIVE mg/dL   Hgb urine dipstick NEGATIVE NEGATIVE   Bilirubin Urine NEGATIVE NEGATIVE   Ketones, ur NEGATIVE NEGATIVE mg/dL   Protein, ur NEGATIVE NEGATIVE mg/dL   Nitrite NEGATIVE NEGATIVE   Leukocytes, UA NEGATIVE NEGATIVE   Imaging Review Ct Renal Stone Study  12/18/2015  CLINICAL DATA:  Acute onset of right-sided back pain, radiating to the stomach. Initial encounter. EXAM: CT ABDOMEN AND PELVIS WITHOUT CONTRAST TECHNIQUE: Multidetector CT imaging of the abdomen and pelvis was performed following the standard protocol without IV contrast. COMPARISON:  Pelvic ultrasound performed 10/31/2003 FINDINGS: The visualized lung bases are clear. The liver and spleen are unremarkable in appearance. The gallbladder is within normal limits. The pancreas and adrenal glands are unremarkable. The kidneys are unremarkable in appearance. There is no evidence  of hydronephrosis. No renal or ureteral stones are seen. No perinephric stranding is appreciated. No free  fluid is identified. The small bowel is unremarkable in appearance. The stomach is within normal limits. No acute vascular abnormalities are seen. The appendix is normal in caliber and contains air, without evidence of appendicitis. Scattered diverticulosis is noted along the descending and proximal sigmoid colon, without evidence of diverticulitis. The bladder is relatively decompressed and grossly unremarkable. A mildly calcified uterine fibroid is seen. The uterus is otherwise grossly unremarkable. The ovaries are relatively symmetric. No suspicious adnexal masses are seen. No inguinal lymphadenopathy is seen. No acute osseous abnormalities are identified. IMPRESSION: 1. No acute abnormality seen within the abdomen or pelvis. 2. Scattered diverticulosis along the descending and proximal sigmoid colon, without evidence of diverticulitis. 3. Mildly calcified uterine fibroid noted. Electronically Signed   By: Garald Balding M.D.   On: 12/18/2015 00:11   I have personally reviewed and evaluated these images and lab results as part of my medical decision-making.   MDM   Final diagnoses:  Right flank pain   Right flank pain of uncertain cause. Urinalysis is unremarkable. She is sent for CT of the abdomen and pelvis to look for occult renal calculi. That has come back normal. Laboratory workup was also unremarkable. Cause for pain is not clear. She was given a dose of morphine with excellent relief of pain. I am concerned about possible early herpes zoster. I have instructed the patient to watch for rash developing in the area where she is having pain. Return to her physician or to the ED should rash develop. In the meantime, she is given a prescription for oxycodone have acetaminophen for pain.  I personally performed the services described in this documentation, which was scribed in my presence. The  recorded information has been reviewed and is accurate.      Delora Fuel, MD [id number]

## 2015-12-17 NOTE — ED Notes (Signed)
Pt c/o right side back pain that radiates to stomach, pt went to PCP and had urine checked this morning. C/o nausea as well.

## 2015-12-17 NOTE — ED Notes (Signed)
MD at bedside,

## 2015-12-18 MED ORDER — OXYCODONE-ACETAMINOPHEN 5-325 MG PO TABS: 1.0000 | ORAL_TABLET | ORAL | Status: DC | PRN

## 2015-12-18 NOTE — Discharge Instructions (Signed)
Take ibuprofen or naproxen as needed. If you develop a rash where the pain is, see your doctor or return to the ED as soon as possible.  Acetaminophen; Oxycodone tablets What is this medicine? ACETAMINOPHEN; OXYCODONE (a set a MEE noe fen; ox i KOE done) is a pain reliever. It is used to treat moderate to severe pain. This medicine may be used for other purposes; ask your health care provider or pharmacist if you have questions. What should I tell my health care provider before I take this medicine? They need to know if you have any of these conditions: -brain tumor -Crohn's disease, inflammatory bowel disease, or ulcerative colitis -drug abuse or addiction -head injury -heart or circulation problems -if you often drink alcohol -kidney disease or problems going to the bathroom -liver disease -lung disease, asthma, or breathing problems -an unusual or allergic reaction to acetaminophen, oxycodone, other opioid analgesics, other medicines, foods, dyes, or preservatives -pregnant or trying to get pregnant -breast-feeding How should I use this medicine? Take this medicine by mouth with a full glass of water. Follow the directions on the prescription label. You can take it with or without food. If it upsets your stomach, take it with food. Take your medicine at regular intervals. Do not take it more often than directed. Talk to your pediatrician regarding the use of this medicine in children. Special care may be needed. Patients over 31 years old may have a stronger reaction and need a smaller dose. Overdosage: If you think you have taken too much of this medicine contact a poison control center or emergency room at once. NOTE: This medicine is only for you. Do not share this medicine with others. What if I miss a dose? If you miss a dose, take it as soon as you can. If it is almost time for your next dose, take only that dose. Do not take double or extra doses. What may interact with this  medicine? -alcohol -antihistamines -barbiturates like amobarbital, butalbital, butabarbital, methohexital, pentobarbital, phenobarbital, thiopental, and secobarbital -benztropine -drugs for bladder problems like solifenacin, trospium, oxybutynin, tolterodine, hyoscyamine, and methscopolamine -drugs for breathing problems like ipratropium and tiotropium -drugs for certain stomach or intestine problems like propantheline, homatropine methylbromide, glycopyrrolate, atropine, belladonna, and dicyclomine -general anesthetics like etomidate, ketamine, nitrous oxide, propofol, desflurane, enflurane, halothane, isoflurane, and sevoflurane -medicines for depression, anxiety, or psychotic disturbances -medicines for sleep -muscle relaxants -naltrexone -narcotic medicines (opiates) for pain -phenothiazines like perphenazine, thioridazine, chlorpromazine, mesoridazine, fluphenazine, prochlorperazine, promazine, and trifluoperazine -scopolamine -tramadol -trihexyphenidyl This list may not describe all possible interactions. Give your health care provider a list of all the medicines, herbs, non-prescription drugs, or dietary supplements you use. Also tell them if you smoke, drink alcohol, or use illegal drugs. Some items may interact with your medicine. What should I watch for while using this medicine? Tell your doctor or health care professional if your pain does not go away, if it gets worse, or if you have new or a different type of pain. You may develop tolerance to the medicine. Tolerance means that you will need a higher dose of the medication for pain relief. Tolerance is normal and is expected if you take this medicine for a long time. Do not suddenly stop taking your medicine because you may develop a severe reaction. Your body becomes used to the medicine. This does NOT mean you are addicted. Addiction is a behavior related to getting and using a drug for a non-medical reason. If you have pain,  you  have a medical reason to take pain medicine. Your doctor will tell you how much medicine to take. If your doctor wants you to stop the medicine, the dose will be slowly lowered over time to avoid any side effects. You may get drowsy or dizzy. Do not drive, use machinery, or do anything that needs mental alertness until you know how this medicine affects you. Do not stand or sit up quickly, especially if you are an older patient. This reduces the risk of dizzy or fainting spells. Alcohol may interfere with the effect of this medicine. Avoid alcoholic drinks. There are different types of narcotic medicines (opiates) for pain. If you take more than one type at the same time, you may have more side effects. Give your health care provider a list of all medicines you use. Your doctor will tell you how much medicine to take. Do not take more medicine than directed. Call emergency for help if you have problems breathing. The medicine will cause constipation. Try to have a bowel movement at least every 2 to 3 days. If you do not have a bowel movement for 3 days, call your doctor or health care professional. Do not take Tylenol (acetaminophen) or medicines that have acetaminophen with this medicine. Too much acetaminophen can be very dangerous. Many nonprescription medicines contain acetaminophen. Always read the labels carefully to avoid taking more acetaminophen. What side effects may I notice from receiving this medicine? Side effects that you should report to your doctor or health care professional as soon as possible: -allergic reactions like skin rash, itching or hives, swelling of the face, lips, or tongue -breathing difficulties, wheezing -confusion -light headedness or fainting spells -severe stomach pain -unusually weak or tired -yellowing of the skin or the whites of the eyes Side effects that usually do not require medical attention (report to your doctor or health care professional if they continue  or are bothersome): -dizziness -drowsiness -nausea -vomiting This list may not describe all possible side effects. Call your doctor for medical advice about side effects. You may report side effects to FDA at 1-800-FDA-1088. Where should I keep my medicine? Keep out of the reach of children. This medicine can be abused. Keep your medicine in a safe place to protect it from theft. Do not share this medicine with anyone. Selling or giving away this medicine is dangerous and against the law. This medicine may cause accidental overdose and death if it taken by other adults, children, or pets. Mix any unused medicine with a substance like cat litter or coffee grounds. Then throw the medicine away in a sealed container like a sealed bag or a coffee can with a lid. Do not use the medicine after the expiration date. Store at room temperature between 20 and 25 degrees C (68 and 77 degrees F). NOTE: This sheet is a summary. It may not cover all possible information. If you have questions about this medicine, talk to your doctor, pharmacist, or health care provider.    2016, [employer]. (2014-10-08 15:18:46)

## 2016-09-20 DIAGNOSIS — E2839 Other primary ovarian failure: Secondary | ICD-10-CM | POA: Insufficient documentation

## 2016-09-20 DIAGNOSIS — N3010 Interstitial cystitis (chronic) without hematuria: Secondary | ICD-10-CM | POA: Insufficient documentation

## 2016-11-17 DIAGNOSIS — E66812 Obesity, class 2: Secondary | ICD-10-CM | POA: Insufficient documentation

## 2016-11-17 DIAGNOSIS — E6609 Other obesity due to excess calories: Secondary | ICD-10-CM

## 2016-11-17 DIAGNOSIS — R7309 Other abnormal glucose: Secondary | ICD-10-CM

## 2016-11-17 DIAGNOSIS — E661 Drug-induced obesity: Secondary | ICD-10-CM | POA: Insufficient documentation

## 2016-11-17 HISTORY — DX: Other obesity due to excess calories: E66.09

## 2016-11-17 HISTORY — DX: Obesity, class 2: E66.812

## 2016-11-17 HISTORY — DX: Other abnormal glucose: R73.09

## 2017-03-02 DIAGNOSIS — R9389 Abnormal findings on diagnostic imaging of other specified body structures: Secondary | ICD-10-CM

## 2017-03-02 HISTORY — DX: Abnormal findings on diagnostic imaging of other specified body structures: R93.89

## 2019-04-05 DIAGNOSIS — E1165 Type 2 diabetes mellitus with hyperglycemia: Secondary | ICD-10-CM | POA: Insufficient documentation

## 2019-04-05 HISTORY — DX: Type 2 diabetes mellitus with hyperglycemia: E11.65

## 2019-04-23 ENCOUNTER — Other Ambulatory Visit: Payer: Self-pay | Admitting: Sports Medicine

## 2019-04-23 DIAGNOSIS — M25511 Pain in right shoulder: Secondary | ICD-10-CM

## 2019-04-23 DIAGNOSIS — M542 Cervicalgia: Secondary | ICD-10-CM

## 2019-05-11 ENCOUNTER — Ambulatory Visit
Admission: RE | Admit: 2019-05-11 | Discharge: 2019-05-11 | Disposition: A | Discharge status: Home / Self Care | Payer: BLUE CROSS/BLUE SHIELD | Source: Ambulatory Visit | Attending: Sports Medicine | Admitting: Sports Medicine

## 2019-05-11 ENCOUNTER — Other Ambulatory Visit: Payer: Self-pay

## 2019-05-11 DIAGNOSIS — M25511 Pain in right shoulder: Secondary | ICD-10-CM

## 2019-05-11 DIAGNOSIS — M542 Cervicalgia: Secondary | ICD-10-CM

## 2019-05-14 ENCOUNTER — Other Ambulatory Visit: Payer: Self-pay | Admitting: *Deleted

## 2019-05-14 DIAGNOSIS — Z20822 Contact with and (suspected) exposure to covid-19: Secondary | ICD-10-CM

## 2019-05-16 NOTE — Addendum Note (Signed)
Addended by: Brigitte Pulse on: 05/16/2019 04:33 PM   Modules accepted: Orders

## 2019-05-21 ENCOUNTER — Other Ambulatory Visit: Payer: Self-pay | Admitting: *Deleted

## 2019-05-21 DIAGNOSIS — Z20822 Contact with and (suspected) exposure to covid-19: Secondary | ICD-10-CM

## 2019-05-27 LAB — NOVEL CORONAVIRUS, NAA: SARS-CoV-2, NAA: NOT DETECTED

## 2019-06-05 ENCOUNTER — Telehealth: Payer: Self-pay | Admitting: *Deleted

## 2019-06-05 NOTE — Telephone Encounter (Signed)
Pt returned call and given negative coronavirus testing results. Pt verbalized understanding.

## 2019-09-07 DIAGNOSIS — K760 Fatty (change of) liver, not elsewhere classified: Secondary | ICD-10-CM

## 2019-09-07 DIAGNOSIS — J989 Respiratory disorder, unspecified: Secondary | ICD-10-CM

## 2019-09-07 DIAGNOSIS — J302 Other seasonal allergic rhinitis: Secondary | ICD-10-CM | POA: Insufficient documentation

## 2019-09-07 DIAGNOSIS — N6029 Fibroadenosis of unspecified breast: Secondary | ICD-10-CM

## 2019-09-07 DIAGNOSIS — K5730 Diverticulosis of large intestine without perforation or abscess without bleeding: Secondary | ICD-10-CM

## 2019-09-07 HISTORY — DX: Other seasonal allergic rhinitis: J30.2

## 2019-09-07 HISTORY — DX: Respiratory disorder, unspecified: J98.9

## 2019-09-07 HISTORY — DX: Fatty (change of) liver, not elsewhere classified: K76.0

## 2019-09-07 HISTORY — DX: Fibroadenosis of unspecified breast: N60.29

## 2019-09-07 HISTORY — DX: Diverticulosis of large intestine without perforation or abscess without bleeding: K57.30

## 2019-11-07 DIAGNOSIS — M25511 Pain in right shoulder: Secondary | ICD-10-CM | POA: Insufficient documentation

## 2019-11-07 HISTORY — DX: Pain in right shoulder: M25.511

## 2019-11-08 DIAGNOSIS — M19011 Primary osteoarthritis, right shoulder: Secondary | ICD-10-CM

## 2019-11-08 HISTORY — DX: Primary osteoarthritis, right shoulder: M19.011

## 2020-07-13 DIAGNOSIS — M7541 Impingement syndrome of right shoulder: Secondary | ICD-10-CM | POA: Insufficient documentation

## 2020-07-13 HISTORY — DX: Impingement syndrome of right shoulder: M75.41

## 2020-07-17 DIAGNOSIS — Z4789 Encounter for other orthopedic aftercare: Secondary | ICD-10-CM

## 2020-07-17 HISTORY — DX: Encounter for other orthopedic aftercare: Z47.89

## 2021-05-24 ENCOUNTER — Encounter: Payer: Self-pay | Admitting: Emergency Medicine

## 2021-05-24 ENCOUNTER — Ambulatory Visit
Admission: EM | Admit: 2021-05-24 | Discharge: 2021-05-24 | Disposition: A | Discharge status: Home / Self Care | Payer: Managed Care, Other (non HMO) | Attending: Emergency Medicine | Admitting: Emergency Medicine

## 2021-05-24 ENCOUNTER — Other Ambulatory Visit: Payer: Self-pay

## 2021-05-24 DIAGNOSIS — M545 Low back pain, unspecified: Secondary | ICD-10-CM

## 2021-05-24 DIAGNOSIS — R220 Localized swelling, mass and lump, head: Secondary | ICD-10-CM

## 2021-05-24 DIAGNOSIS — T7840XA Allergy, unspecified, initial encounter: Secondary | ICD-10-CM

## 2021-05-24 MED ORDER — METHYLPREDNISOLONE SODIUM SUCC 125 MG IJ SOLR
125.0000 mg | Freq: Once | INTRAMUSCULAR | Status: AC
  Administered 2021-05-24: 125 mg via INTRAMUSCULAR

## 2021-05-24 MED ORDER — FAMOTIDINE 40 MG/5ML PO SUSR: 20.0000 mg | Freq: Two times a day (BID) | ORAL | 0 refills | Status: DC

## 2021-05-24 MED ORDER — PREDNISONE 10 MG PO TABS: ORAL_TABLET | ORAL | 0 refills | Status: DC

## 2021-05-24 MED ORDER — TRAMADOL HCL 50 MG PO TABS: 50.0000 mg | ORAL_TABLET | Freq: Four times a day (QID) | ORAL | 0 refills | Status: DC | PRN

## 2021-05-24 NOTE — Discharge Instructions (Addendum)
We gave you a shot of Solu-Medrol Begin prednisone taper x6 days-begin with 6 tablets on day 1, decrease by 1 tablet each day until complete-6, 5, 4, 3, 2, 1-take with food and earlier in the day if possible  Daily cetirizine/Zyrtec or loratadine/Claritin in the morning, Benadryl in the evening Pepcid liquid twice daily stop with any underlying increase in acid from allergic reaction Tramadol for severe back pain Please follow-up if any symptoms not improving or worsening

## 2021-05-24 NOTE — ED Provider Notes (Signed)
[facility]    CSN: [id number] Arrival date & time: 05/24/21  3354      History   Chief Complaint Chief Complaint  Patient presents with   Allergic Reaction    HPI Kara Cannon is a 50 y.o. female history of hypothyroid, hyperlipidemia, hypertension, presenting today for evaluation of possible allergic reaction.  Reports Saturday night drank a strawberry brisk drink, the following morning she woke up with left-sided facial swelling, lip swelling as well as right-sided back pain.  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  Notes that this is mainly with foods and drinks, has not experienced this reaction with corn containing medicines.  She reports that she commonly will get back pain associated with allergic reactions.  Denies any back pain between flares.  In the past has needed tramadol for severe pain associated with this.  She has had prior imaging without clear cause of this.  Previously saw allergist, but has not had an allergic reaction while.  She denies any difficulty breathing or shortness of breath.  Reports some slight discomfort in her throat.  HPI  Past Medical History:  Diagnosis Date   Allergy    CARDIAC MURMUR 06/22/2009   Mitral regurgitation   CHICKENPOX, HX OF 06/22/2009   Cough 09/17/2009   Effusion of ankle and foot joint 06/22/2009   EPISCLERITIS 06/22/2009   Headache(784.0) 06/22/2009   HEMORRHOIDS, INTERNAL 06/23/2003   Hyperlipidemia    HYPERTENSION 06/22/2009   IC (interstitial cystitis)    MIGRAINE HEADACHE 06/22/2009   PLANTAR WART, LEFT 08/03/2009   Premature ovarian failure    Unspecified vitamin D deficiency 06/22/2009   Urinary frequency 06/22/2009   UTI'S, HX OF 06/22/2009    Patient Active Problem List   Diagnosis Date Noted   Hypothyroidism 09/22/2014   Other abnormal glucose 04/05/2013   Hyperlipidemia 04/05/2013   Stress reaction 03/22/2013   Premature ovarian failure    IC (interstitial cystitis)     PLANTAR WART, LEFT 08/03/2009   UNSPECIFIED VITAMIN D DEFICIENCY 06/22/2009   MIGRAINE HEADACHE 06/22/2009   EPISCLERITIS 06/22/2009   HYPERTENSION 06/22/2009   INTERSTITIAL CYSTITIS 06/22/2009   EFFUSION OF ANKLE AND FOOT JOINT 06/22/2009   CARDIAC MURMUR 06/22/2009   UTI'S, HX OF 06/22/2009   CHICKENPOX, HX OF 06/22/2009   HEMORRHOIDS, INTERNAL 06/23/2003    Past Surgical History:  Procedure Laterality Date   CYSTOSCOPY  02/2009   Dr. Jeffie Pollock    OB History     Gravida  1   Para      Term      Preterm      AB  1   Living  0      SAB      IAB      Ectopic      Multiple      Live Births               Home Medications    Prior to Admission medications   Medication Sig Start Date End Date Taking? Authorizing Provider  famotidine (PEPCID) 40 MG/5ML suspension Take 2.5 mLs (20 mg total) by mouth 2 (two) times daily. 05/24/21  Yes Durene Dodge C, PA-C  predniSONE (DELTASONE) 10 MG tablet Begin with 6 tabs on day 1, 5 tab on day 2, 4 tab on day 3, 3 tab on day 4, 2 tab on day 5, 1 tab on day 6-take with food 05/24/21  Yes Kurstin Dimarzo, Benedict C, PA-C  traMADol Veatrice Bourbon)  50 MG tablet Take 1 tablet (50 mg total) by mouth every 6 (six) hours as needed. 05/24/21  Yes Oliwia Berzins C, PA-C  Black Currant Seed Oil 500 MG CAPS Take 1 capsule by mouth daily.    [provider]  chlorzoxazone (PARAFON) 500 MG tablet Take 500 mg by mouth 3 (three) times daily as needed for muscle spasms.    [provider]  co-enzyme Q-10 30 MG capsule Take 30 mg by mouth daily.    [provider]  Flaxseed, Linseed, (FLAXSEED OIL) 1000 MG CAPS Take 1 capsule by mouth daily.    [provider]  HYDROcodone-homatropine (HYCODAN) 5-1.5 MG/5ML syrup Take 5 mLs by mouth every 6 (six) hours as needed for cough.  12/14/15   [provider]  ibuprofen (ADVIL,MOTRIN) 600 MG tablet Take 600 mg by mouth every 6 (six) hours as needed for moderate pain.  11/07/15    [provider]  MYRBETRIQ 50 MG TB24 tablet Take 50 mg by mouth daily.  10/09/15   [provider]  pravastatin (PRAVACHOL) 20 MG tablet Take 20 mg by mouth at bedtime.  11/04/15   [provider]  Probiotic Product (PROBIOTIC PO) Take 1 capsule by mouth daily.    [provider]  pseudoephedrine (SUDAFED) 30 MG tablet Take 30 mg by mouth every 4 (four) hours as needed for congestion.    [provider]  sodium chloride (OCEAN) 0.65 % SOLN nasal spray Place 1 spray into both nostrils daily as needed for congestion.    [provider]  SYNTHROID 25 MCG tablet Take 25 mcg by mouth daily before breakfast.  09/25/15   [provider]  dicyclomine (BENTYL) 20 MG tablet Take 1 tablet (20 mg total) by mouth every 6 (six) hours. Patient not taking: Reported on 12/17/2015 09/22/14 12/18/15  Ferman Hamming L, DO  hydrochlorothiazide (HYDRODIURIL) 12.5 MG tablet Take 1 tablet (12.5 mg total) by mouth daily. Patient not taking: Reported on 12/17/2015 04/18/14 12/18/15  Shawna Orleans, Doe-Hyun R, DO  ipratropium (ATROVENT) 0.03 % nasal spray Place 2 sprays into the nose 4 (four) times daily. Patient not taking: Reported on 12/17/2015 11/28/13 12/18/15  Shawnee Knapp, MD  potassium chloride (K-DUR) 10 MEQ tablet Take 1 tablet (10 mEq total) by mouth 2 (two) times daily. Patient not taking: Reported on 12/17/2015 05/15/13 12/18/15  Rosine Abe, DO    Family History Family History  Problem Relation Age of Onset   Skin cancer Maternal Uncle    Hypertension Mother    Hypertension Father    Ovarian cancer Paternal Grandmother    Cancer Paternal Grandmother    Diabetes Maternal Grandmother    Hyperlipidemia Maternal Grandmother     Social History Social History   Tobacco Use   Smoking status: Never   Tobacco comments:    Married  Substance Use Topics   Alcohol use: No   Drug use: No     Allergies   Corn-containing products, Ibuprofen, Naproxen  sodium, Peanut-containing drug products, Penicillins, and Sulfonamide derivatives   Review of Systems Review of Systems  Constitutional:  Negative for fatigue and fever.  HENT:  Positive for facial swelling. Negative for trouble swallowing.   Eyes:  Negative for visual disturbance.  Respiratory:  Negative for shortness of breath.   Cardiovascular:  Negative for chest pain.  Gastrointestinal:  Negative for abdominal pain, nausea and vomiting.  Musculoskeletal:  Positive for back pain and myalgias. Negative for arthralgias, joint swelling and neck  pain.  Skin:  Negative for color change, rash and wound.  Neurological:  Negative for dizziness, weakness, light-headedness and headaches.    Physical Exam Triage Vital Signs ED Triage Vitals  Enc Vitals Group     BP      Pulse      Resp      Temp      Temp src      SpO2      Weight      Height      Head Circumference      Peak Flow      Pain Score      Pain Loc      Pain Edu?      Excl. in [place]?    No data found.  Updated Vital Signs BP (!) 163/94 (BP Location: Left Arm)   Pulse 89   Temp 98.6 F (37 C) (Oral)   Resp 18   SpO2 95%   Visual Acuity Right Eye Distance:   Left Eye Distance:   Bilateral Distance:    Right Eye Near:   Left Eye Near:    Bilateral Near:     Physical Exam Vitals and nursing note reviewed.  Constitutional:      Appearance: She is well-developed.     Comments: No acute distress  HENT:     Head: Normocephalic and atraumatic.     Nose: Nose normal.     Mouth/Throat:     Comments: Left-sided facial and lip swelling, no soft palate swelling Oral mucosa pink and moist, no tonsillar enlargement or exudate. Posterior pharynx patent and nonerythematous, no uvula deviation or swelling. Normal phonation.  Eyes:     Conjunctiva/sclera: Conjunctivae normal.  Cardiovascular:     Rate and Rhythm: Normal rate.  Pulmonary:     Effort: Pulmonary effort is normal. No respiratory distress.      Comments: Speaking in full sentences Breathing comfortably at rest, CTABL, no wheezing, rales or other adventitious sounds auscultated   Abdominal:     General: There is no distension.  Musculoskeletal:        General: Normal range of motion.     Cervical back: Neck supple.     Comments: Tender to palpation in right trapezius area as well as right lumbar area, moving all extremities appropriately  Skin:    General: Skin is warm and dry.  Neurological:     Mental Status: She is alert and oriented to person, place, and time.     UC Treatments / Results  Labs (all labs ordered are listed, but only abnormal results are displayed) Labs Reviewed - No data to display  EKG   Radiology No results found.  Procedures Procedures (including critical care time)  Medications Ordered in UC Medications  methylPREDNISolone sodium succinate (SOLU-MEDROL) 125 mg/2 mL injection 125 mg (has no administration in time range)    Initial Impression / Assessment and Plan / UC Course  I have reviewed the triage vital signs and the nursing notes.  Pertinent labs & imaging results that were available during my care of the patient were reviewed by me and considered in my medical decision making (see chart for details).    Allergic reaction causing facial swelling/back pain-unclear correlation of back pain with allergic reaction-providing Solu-Medrol and prednisone taper, no other airway compromise at this time, speaking full sentences, antihistamines, possible underlying acid contributing to discomfort in throat, recommended H2 inhibitors.  Provided 2 days / 8 tablets of tramadol for severe back  pain-discussed this is not typical treatment for allergic reaction, but given history did opt to provide.  Advised to use sparingly and only for severe pain.  Discussed strict return precautions. Patient verbalized understanding and is agreeable with plan.  Final Clinical Impressions(s) / UC Diagnoses    Final diagnoses:  Allergic reaction, initial encounter  Left facial swelling  Acute right-sided low back pain without sciatica     Discharge Instructions      We gave you a shot of Solu-Medrol Begin prednisone taper x6 days-begin with 6 tablets on day 1, decrease by 1 tablet each day until complete-6, 5, 4, 3, 2, 1-take with food and earlier in the day if possible  Daily cetirizine/Zyrtec or loratadine/Claritin in the morning, Benadryl in the evening Pepcid liquid twice daily stop with any underlying increase in acid from allergic reaction Tramadol for severe back pain Please follow-up if any symptoms not improving or worsening     ED Prescriptions     Medication Sig Dispense Auth. Provider   predniSONE (DELTASONE) 10 MG tablet Begin with 6 tabs on day 1, 5 tab on day 2, 4 tab on day 3, 3 tab on day 4, 2 tab on day 5, 1 tab on day 6-take with food 21 tablet Leeza Heiner C, PA-C   traMADol (ULTRAM) 50 MG tablet Take 1 tablet (50 mg total) by mouth every 6 (six) hours as needed. 8 tablet Sanaya Gwilliam C, PA-C   famotidine (PEPCID) 40 MG/5ML suspension Take 2.5 mLs (20 mg total) by mouth 2 (two) times daily. 50 mL Jadarious Dobbins, [place] C, PA-C      I have reviewed the PDMP during this encounter.   Joneen Caraway Vidor C, PA-C 05/24/21 1023

## 2021-05-24 NOTE — ED Triage Notes (Signed)
Pt sts allergic reaction with upper lip swelling and back pain starting yesterday with hx of same; pt sts hx of allergy to corn products and she drank soda yesterday

## 2021-06-19 ENCOUNTER — Emergency Department (HOSPITAL_BASED_OUTPATIENT_CLINIC_OR_DEPARTMENT_OTHER): Payer: Managed Care, Other (non HMO)

## 2021-06-19 ENCOUNTER — Encounter (HOSPITAL_BASED_OUTPATIENT_CLINIC_OR_DEPARTMENT_OTHER): Payer: Self-pay

## 2021-06-19 ENCOUNTER — Other Ambulatory Visit: Payer: Self-pay

## 2021-06-19 ENCOUNTER — Inpatient Hospital Stay (HOSPITAL_BASED_OUTPATIENT_CLINIC_OR_DEPARTMENT_OTHER)
Admission: EM | Admit: 2021-06-19 | Discharge: 2021-06-22 | DRG: 177 | Disposition: A | Discharge status: Home / Self Care | Payer: Managed Care, Other (non HMO) | Attending: Internal Medicine | Admitting: Internal Medicine

## 2021-06-19 DIAGNOSIS — Z6841 Body Mass Index (BMI) 40.0 and over, adult: Secondary | ICD-10-CM

## 2021-06-19 DIAGNOSIS — Z8041 Family history of malignant neoplasm of ovary: Secondary | ICD-10-CM

## 2021-06-19 DIAGNOSIS — J9690 Respiratory failure, unspecified, unspecified whether with hypoxia or hypercapnia: Secondary | ICD-10-CM | POA: Diagnosis present

## 2021-06-19 DIAGNOSIS — E039 Hypothyroidism, unspecified: Secondary | ICD-10-CM | POA: Diagnosis present

## 2021-06-19 DIAGNOSIS — Z882 Allergy status to sulfonamides status: Secondary | ICD-10-CM

## 2021-06-19 DIAGNOSIS — Z79899 Other long term (current) drug therapy: Secondary | ICD-10-CM

## 2021-06-19 DIAGNOSIS — Z833 Family history of diabetes mellitus: Secondary | ICD-10-CM

## 2021-06-19 DIAGNOSIS — I82462 Acute embolism and thrombosis of left calf muscular vein: Secondary | ICD-10-CM | POA: Diagnosis present

## 2021-06-19 DIAGNOSIS — R7303 Prediabetes: Secondary | ICD-10-CM | POA: Diagnosis present

## 2021-06-19 DIAGNOSIS — I10 Essential (primary) hypertension: Secondary | ICD-10-CM | POA: Diagnosis present

## 2021-06-19 DIAGNOSIS — Z91010 Allergy to peanuts: Secondary | ICD-10-CM

## 2021-06-19 DIAGNOSIS — R0602 Shortness of breath: Secondary | ICD-10-CM | POA: Diagnosis not present

## 2021-06-19 DIAGNOSIS — Z808 Family history of malignant neoplasm of other organs or systems: Secondary | ICD-10-CM

## 2021-06-19 DIAGNOSIS — I82451 Acute embolism and thrombosis of right peroneal vein: Secondary | ICD-10-CM | POA: Diagnosis present

## 2021-06-19 DIAGNOSIS — Z86711 Personal history of pulmonary embolism: Secondary | ICD-10-CM

## 2021-06-19 DIAGNOSIS — Z880 Allergy status to penicillin: Secondary | ICD-10-CM

## 2021-06-19 DIAGNOSIS — Z8249 Family history of ischemic heart disease and other diseases of the circulatory system: Secondary | ICD-10-CM

## 2021-06-19 DIAGNOSIS — Z886 Allergy status to analgesic agent status: Secondary | ICD-10-CM

## 2021-06-19 DIAGNOSIS — U071 COVID-19: Secondary | ICD-10-CM | POA: Diagnosis not present

## 2021-06-19 DIAGNOSIS — Z91018 Allergy to other foods: Secondary | ICD-10-CM

## 2021-06-19 DIAGNOSIS — E785 Hyperlipidemia, unspecified: Secondary | ICD-10-CM | POA: Diagnosis present

## 2021-06-19 DIAGNOSIS — J9601 Acute respiratory failure with hypoxia: Secondary | ICD-10-CM

## 2021-06-19 DIAGNOSIS — D509 Iron deficiency anemia, unspecified: Secondary | ICD-10-CM | POA: Diagnosis present

## 2021-06-19 DIAGNOSIS — Z83438 Family history of other disorder of lipoprotein metabolism and other lipidemia: Secondary | ICD-10-CM

## 2021-06-19 DIAGNOSIS — Z79890 Hormone replacement therapy (postmenopausal): Secondary | ICD-10-CM

## 2021-06-19 DIAGNOSIS — I2699 Other pulmonary embolism without acute cor pulmonale: Secondary | ICD-10-CM

## 2021-06-19 LAB — CBC WITH DIFFERENTIAL/PLATELET
Abs Immature Granulocytes: 0.03 10*3/uL (ref 0.00–0.07)
Basophils Absolute: 0 10*3/uL (ref 0.0–0.1)
Basophils Relative: 0 %
Eosinophils Absolute: 0 10*3/uL (ref 0.0–0.5)
Eosinophils Relative: 1 %
HCT: 30.8 % — ABNORMAL LOW (ref 36.0–46.0)
Hemoglobin: 9.3 g/dL — ABNORMAL LOW (ref 12.0–15.0)
Immature Granulocytes: 1 %
Lymphocytes Relative: 43 %
Lymphs Abs: 2.3 10*3/uL (ref 0.7–4.0)
MCH: 22.7 pg — ABNORMAL LOW (ref 26.0–34.0)
MCHC: 30.2 g/dL (ref 30.0–36.0)
MCV: 75.3 fL — ABNORMAL LOW (ref 80.0–100.0)
Monocytes Absolute: 0.7 10*3/uL (ref 0.1–1.0)
Monocytes Relative: 13 %
Neutro Abs: 2.3 10*3/uL (ref 1.7–7.7)
Neutrophils Relative %: 42 %
Platelets: 333 10*3/uL (ref 150–400)
RBC: 4.09 MIL/uL (ref 3.87–5.11)
RDW: 18.1 % — ABNORMAL HIGH (ref 11.5–15.5)
WBC: 5.3 10*3/uL (ref 4.0–10.5)
nRBC: 0 % (ref 0.0–0.2)

## 2021-06-19 LAB — TRIGLYCERIDES: Triglycerides: 189 mg/dL — ABNORMAL HIGH (ref <150)

## 2021-06-19 LAB — COMPREHENSIVE METABOLIC PANEL
ALT: 19 U/L (ref 0–44)
AST: 29 U/L (ref 15–41)
Albumin: 3.5 g/dL (ref 3.5–5.0)
Alkaline Phosphatase: 63 U/L (ref 38–126)
Anion gap: 12 (ref 5–15)
BUN: 14 mg/dL (ref 6–20)
CO2: 27 mmol/L (ref 22–32)
Calcium: 8.2 mg/dL — ABNORMAL LOW (ref 8.9–10.3)
Chloride: 99 mmol/L (ref 98–111)
Creatinine, Ser: 0.92 mg/dL (ref 0.44–1.00)
GFR, Estimated: >60 mL/min (ref >60)
Glucose, Bld: 124 mg/dL — ABNORMAL HIGH (ref 70–99)
Potassium: 2.9 mmol/L — ABNORMAL LOW (ref 3.5–5.1)
Sodium: 138 mmol/L (ref 135–145)
Total Bilirubin: 0.5 mg/dL (ref 0.3–1.2)
Total Protein: 7.4 g/dL (ref 6.5–8.1)

## 2021-06-19 LAB — RESP PANEL BY RT-PCR (FLU A&B, COVID) ARPGX2
Influenza A by PCR: NEGATIVE
Influenza B by PCR: NEGATIVE
SARS Coronavirus 2 by RT PCR: POSITIVE — AB

## 2021-06-19 LAB — TROPONIN I (HIGH SENSITIVITY)
Troponin I (High Sensitivity): 10 ng/L (ref <18)
Troponin I (High Sensitivity): 13 ng/L (ref <18)

## 2021-06-19 LAB — PROTIME-INR
INR: 1 (ref 0.8–1.2)
Prothrombin Time: 13.3 seconds (ref 11.4–15.2)

## 2021-06-19 LAB — BRAIN NATRIURETIC PEPTIDE: B Natriuretic Peptide: 14.9 pg/mL (ref 0.0–100.0)

## 2021-06-19 LAB — LACTATE DEHYDROGENASE: LDH: 193 U/L — ABNORMAL HIGH (ref 98–192)

## 2021-06-19 LAB — FERRITIN: Ferritin: 32 ng/mL (ref 11–307)

## 2021-06-19 LAB — C-REACTIVE PROTEIN: CRP: 5.3 mg/dL — ABNORMAL HIGH (ref <1.0)

## 2021-06-19 LAB — LACTIC ACID, PLASMA
Lactic Acid, Venous: 1.5 mmol/L (ref 0.5–1.9)
Lactic Acid, Venous: 1.2 mmol/L (ref 0.5–1.9)

## 2021-06-19 LAB — PROCALCITONIN: Procalcitonin: <0.1 ng/mL

## 2021-06-19 LAB — PREGNANCY, URINE: Preg Test, Ur: NEGATIVE

## 2021-06-19 LAB — FIBRINOGEN: Fibrinogen: 345 mg/dL (ref 210–475)

## 2021-06-19 LAB — D-DIMER, QUANTITATIVE: D-Dimer, Quant: 11.13 ug/mL-FEU — ABNORMAL HIGH (ref 0.00–0.50)

## 2021-06-19 MED ORDER — DEXAMETHASONE SODIUM PHOSPHATE 10 MG/ML IJ SOLN
10.0000 mg | Freq: Once | INTRAMUSCULAR | Status: AC
  Administered 2021-06-19: 10 mg via INTRAVENOUS

## 2021-06-19 MED ORDER — IPRATROPIUM BROMIDE 0.02 % IN SOLN
0.5000 mg | Freq: Once | RESPIRATORY_TRACT | Status: AC
  Administered 2021-06-19: 0.5 mg via RESPIRATORY_TRACT
  Filled 2021-06-19: qty 2.5

## 2021-06-19 MED ORDER — ALBUTEROL SULFATE HFA 108 (90 BASE) MCG/ACT IN AERS
INHALATION_SPRAY | RESPIRATORY_TRACT | Status: AC
  Administered 2021-06-19: 4 via RESPIRATORY_TRACT
  Filled 2021-06-19: qty 6.7

## 2021-06-19 MED ORDER — ALBUTEROL SULFATE (2.5 MG/3ML) 0.083% IN NEBU
5.0000 mg | INHALATION_SOLUTION | Freq: Once | RESPIRATORY_TRACT | Status: AC
  Administered 2021-06-19: 5 mg via RESPIRATORY_TRACT
  Filled 2021-06-19: qty 6

## 2021-06-19 MED ORDER — HEPARIN BOLUS VIA INFUSION
6000.0000 [IU] | Freq: Once | INTRAVENOUS | Status: AC
  Administered 2021-06-19: 6000 [IU] via INTRAVENOUS

## 2021-06-19 MED ORDER — ALBUTEROL SULFATE HFA 108 (90 BASE) MCG/ACT IN AERS: 4.0000 | INHALATION_SPRAY | Freq: Once | RESPIRATORY_TRACT | Status: AC

## 2021-06-19 MED ORDER — HEPARIN (PORCINE) 25000 UT/250ML-% IV SOLN
1500.0000 [IU]/h | INTRAVENOUS | Status: DC
  Administered 2021-06-19: 1500 [IU]/h via INTRAVENOUS
  Filled 2021-06-19: qty 250

## 2021-06-19 MED ORDER — HEPARIN BOLUS VIA INFUSION: 6500.0000 [IU] | Freq: Once | INTRAVENOUS | Status: DC

## 2021-06-19 MED ORDER — IOHEXOL 350 MG/ML SOLN
100.0000 mL | Freq: Once | INTRAVENOUS | Status: AC | PRN
  Administered 2021-06-19: 100 mL via INTRAVENOUS

## 2021-06-19 MED ORDER — DEXAMETHASONE SODIUM PHOSPHATE 10 MG/ML IJ SOLN
10.0000 mg | Freq: Once | INTRAMUSCULAR | Status: DC
  Filled 2021-06-19: qty 1

## 2021-06-19 NOTE — ED Provider Notes (Signed)
[facility] Provider Note   CSN: [id number] Arrival date & time: 06/19/21  1638     History Chief Complaint  Patient presents with   Shortness of Breath    Kara Cannon is a 50 y.o. female with a past medical history of prediabetes, hypertension and recent diagnosis of COVID 19 infection on 06/15/2021.  This is also the day that her symptoms began.  She is currently taking Paxlovid which began yesterday.  She has no previous history of any respiratory issues including asthma.  She has been taking over-the-counter cold medications without any success.  She has had worsening difficulty breathing over the past 24 hours and arrived hypoxic at 82% on room air.  She has had 4 doses of COVID-19 vaccination.  Her husband is also currently positive.   Shortness of Breath Associated symptoms: cough and wheezing   Associated symptoms: no fever and no vomiting       Past Medical History:  Diagnosis Date   Allergy    CARDIAC MURMUR 06/22/2009   Mitral regurgitation   CHICKENPOX, HX OF 06/22/2009   Cough 09/17/2009   Effusion of ankle and foot joint 06/22/2009   EPISCLERITIS 06/22/2009   Headache(784.0) 06/22/2009   HEMORRHOIDS, INTERNAL 06/23/2003   Hyperlipidemia    HYPERTENSION 06/22/2009   IC (interstitial cystitis)    MIGRAINE HEADACHE 06/22/2009   PLANTAR WART, LEFT 08/03/2009   Premature ovarian failure    Unspecified vitamin D deficiency 06/22/2009   Urinary frequency 06/22/2009   UTI'S, HX OF 06/22/2009    Patient Active Problem List   Diagnosis Date Noted   Hypothyroidism 09/22/2014   Other abnormal glucose 04/05/2013   Hyperlipidemia 04/05/2013   Stress reaction 03/22/2013   Premature ovarian failure    IC (interstitial cystitis)    PLANTAR WART, LEFT 08/03/2009   UNSPECIFIED VITAMIN D DEFICIENCY 06/22/2009   MIGRAINE HEADACHE 06/22/2009   EPISCLERITIS 06/22/2009   HYPERTENSION 06/22/2009   INTERSTITIAL CYSTITIS 06/22/2009   EFFUSION OF ANKLE AND  FOOT JOINT 06/22/2009   CARDIAC MURMUR 06/22/2009   UTI'S, HX OF 06/22/2009   CHICKENPOX, HX OF 06/22/2009   HEMORRHOIDS, INTERNAL 06/23/2003    Past Surgical History:  Procedure Laterality Date   CYSTOSCOPY  02/2009   Dr. Jeffie Pollock     OB History     Gravida  1   Para      Term      Preterm      AB  1   Living  0      SAB      IAB      Ectopic      Multiple      Live Births              Family History  Problem Relation Age of Onset   Skin cancer Maternal Uncle    Hypertension Mother    Hypertension Father    Ovarian cancer Paternal Grandmother    Cancer Paternal Grandmother    Diabetes Maternal Grandmother    Hyperlipidemia Maternal Grandmother     Social History   Tobacco Use   Smoking status: Never   Smokeless tobacco: Never   Tobacco comments:    Married  Scientific laboratory technician Use: Never used  Substance Use Topics   Alcohol use: No   Drug use: No    Home Medications Prior to Admission medications   Medication Sig Start Date End Date Taking? Authorizing Provider  [facility]  500 MG CAPS Take 1 capsule by mouth daily.    [provider]  chlorzoxazone (PARAFON) 500 MG tablet Take 500 mg by mouth 3 (three) times daily as needed for muscle spasms.    [provider]  co-enzyme Q-10 30 MG capsule Take 30 mg by mouth daily.    [provider]  famotidine (PEPCID) 40 MG/5ML suspension Take 2.5 mLs (20 mg total) by mouth 2 (two) times daily. 05/24/21   Wieters, Hallie C, PA-C  Flaxseed, Linseed, (FLAXSEED OIL) 1000 MG CAPS Take 1 capsule by mouth daily.    [provider]  HYDROcodone-homatropine (HYCODAN) 5-1.5 MG/5ML syrup Take 5 mLs by mouth every 6 (six) hours as needed for cough.  12/14/15   [provider]  ibuprofen (ADVIL,MOTRIN) 600 MG tablet Take 600 mg by mouth every 6 (six) hours as needed for moderate pain.  11/07/15   [provider]  MYRBETRIQ 50 MG TB24 tablet Take 50 mg  by mouth daily.  10/09/15   [provider]  pravastatin (PRAVACHOL) 20 MG tablet Take 20 mg by mouth at bedtime.  11/04/15   [provider]  predniSONE (DELTASONE) 10 MG tablet Begin with 6 tabs on day 1, 5 tab on day 2, 4 tab on day 3, 3 tab on day 4, 2 tab on day 5, 1 tab on day 6-take with food 05/24/21   Wieters, [employer] C, PA-C  Probiotic Product (PROBIOTIC PO) Take 1 capsule by mouth daily.    [provider]  pseudoephedrine (SUDAFED) 30 MG tablet Take 30 mg by mouth every 4 (four) hours as needed for congestion.    [provider]  sodium chloride (OCEAN) 0.65 % SOLN nasal spray Place 1 spray into both nostrils daily as needed for congestion.    [provider]  SYNTHROID 25 MCG tablet Take 25 mcg by mouth daily before breakfast.  09/25/15   [provider]  traMADol (ULTRAM) 50 MG tablet Take 1 tablet (50 mg total) by mouth every 6 (six) hours as needed. 05/24/21   Wieters, Hallie C, PA-C  dicyclomine (BENTYL) 20 MG tablet Take 1 tablet (20 mg total) by mouth every 6 (six) hours. Patient not taking: Reported on 12/17/2015 09/22/14 12/18/15  Ferman Hamming L, DO  hydrochlorothiazide (HYDRODIURIL) 12.5 MG tablet Take 1 tablet (12.5 mg total) by mouth daily. Patient not taking: Reported on 12/17/2015 04/18/14 12/18/15  Shawna Orleans, Doe-Hyun R, DO  ipratropium (ATROVENT) 0.03 % nasal spray Place 2 sprays into the nose 4 (four) times daily. Patient not taking: Reported on 12/17/2015 11/28/13 12/18/15  Shawnee Knapp, MD  potassium chloride (K-DUR) 10 MEQ tablet Take 1 tablet (10 mEq total) by mouth 2 (two) times daily. Patient not taking: Reported on 12/17/2015 05/15/13 12/18/15  Shawna Orleans, Doe-Hyun R, DO    Allergies    Corn-containing products, Ibuprofen, Naproxen sodium, Peanut-containing drug products, Penicillins, and Sulfonamide derivatives  Review of Systems   Review of Systems  Constitutional:  Negative for chills and fever.  Respiratory:  Positive for cough,  shortness of breath and wheezing.   Gastrointestinal:  Negative for nausea and vomiting.  Musculoskeletal:  Positive for myalgias.  All other systems reviewed and are negative.  Physical Exam Updated Vital Signs BP 123/81   Pulse (!) 105   Temp 98.6 F (37 C) (Oral)   Resp (!) 23   Ht '5\' 6"'$  (1.676 m)   Wt 102.5 kg   SpO2 98%   BMI 36.48 kg/m  Physical Exam Vitals and nursing note reviewed.  Constitutional:      General: She is not in acute distress.    Appearance: She is well-developed. She is ill-appearing. She is not diaphoretic.  HENT:     Head: Normocephalic and atraumatic.     Right Ear: External ear normal.     Left Ear: External ear normal.     Nose: Nose normal.     Mouth/Throat:     Mouth: Mucous membranes are moist.  Eyes:     General: No scleral icterus.    Conjunctiva/sclera: Conjunctivae normal.  Cardiovascular:     Rate and Rhythm: Normal rate and regular rhythm.     Heart sounds: Normal heart sounds. No murmur heard.   No friction rub. No gallop.  Pulmonary:     Effort: Pulmonary effort is normal. No respiratory distress.     Breath sounds: Wheezing and rhonchi present.  Abdominal:     General: Bowel sounds are normal. There is no distension.     Palpations: Abdomen is soft. There is no mass.     Tenderness: There is no abdominal tenderness. There is no guarding.  Musculoskeletal:     Cervical back: Normal range of motion.  Skin:    General: Skin is warm and dry.  Neurological:     Mental Status: She is alert and oriented to person, place, and time.  Psychiatric:        Behavior: Behavior normal.    ED Results / Procedures / Treatments   Labs (all labs ordered are listed, but only abnormal results are displayed) Labs Reviewed - No data to display  EKG EKG Interpretation  Date/Time:  Saturday June 19 2021 16:47:44 EDT Ventricular Rate:  99 PR Interval:  128 QRS Duration: 90 QT Interval:  338 QTC Calculation: 434 R Axis:   78 Text  Interpretation: Sinus rhythm Probable left atrial enlargement Borderline repolarization abnormality No old tracing to compare Confirmed by Malvin Johns [phone]) on 06/19/2021 5:06:37 PM  Radiology No results found.  Procedures .Critical Care  Date/Time: 06/20/2021 12:07 AM Performed by: Margarita Mail, PA-C Authorized by: Margarita Mail, PA-C   Critical care provider statement:    Critical care time (minutes):  50   Critical care time was exclusive of:  Separately billable procedures and treating other patients   Critical care was necessary to treat or prevent imminent or life-threatening deterioration of the following conditions:  Respiratory failure   Critical care was time spent personally by me on the following activities:  Discussions with consultants, evaluation of patient's response to treatment, examination of patient, ordering and performing treatments and interventions, ordering and review of laboratory studies, ordering and review of radiographic studies, pulse oximetry, re-evaluation of patient's condition, obtaining history from patient or surrogate and review of old charts   Medications Ordered in ED Medications  albuterol (VENTOLIN HFA) 108 (90 Base) MCG/ACT inhaler 4 puff (4 puffs Inhalation Given 06/19/21 1700)    ED Course  I have reviewed the triage vital signs and the nursing notes.  Pertinent labs & imaging results that were available during my care of the patient were reviewed by me and considered in my medical decision making (see chart for details).  Clinical Course as of 06/20/21 0007  Sat Jun 19, 2021  1835 Potassium(!): 2.9 Probably low due to albuterol [AH]    Clinical Course User Index [AH] Margarita Mail, PA-C   MDM Rules/Calculators/A&P  CC:sob, +covid VS:  Vitals:   06/19/21 1915 06/19/21 2030 06/19/21 2147 06/19/21 2245  BP: 115/79 126/84 (!) 151/88 (!) 146/76  Pulse: 97 95 (!) 103 93  Resp: 15 (!) 22 (!) 24 (!)  22  Temp:   99 F (37.2 C)   TempSrc:   Oral   SpO2: 95% 96% 91% 93%  Weight:      Height:        [id number] is gathered by patient and EMR. Previous records obtained and reviewed. DDX:The patient's complaint of shortness of breath involves an extensive number of diagnostic and treatment options, and is a complaint that carries with it a high risk of complications, morbidity, and potential mortality. Given the large differential diagnosis, medical decision making is of high complexity. The emergent differential diagnosis for shortness of breath includes, but is not limited to, Pulmonary edema, bronchoconstriction, Pneumonia, Pulmonary embolism, Pneumotherax/ Hemothorax, Dysrythmia, ACS.   Labs: I ordered reviewed and interpreted labs which include Pre-COVID ED work-up orders which includes respiratory panel positive for coronavirus, inflammatory markers including C-reactive proteins, triglycerides, LDH all elevated.  D-dimer is markedly elevated at 11.13.  CMP shows mild hypokalemia likely secondary to albuterol use, CBC with microcytic anemia at 9.3. Imaging: I ordered and reviewed images which included chest x-ray and CT angiogram of the chest. I independently visualized and interpreted all imaging. Significant findings include multiple bilateral pulmonary emboli without right heart strain, no significant findings on the chest x-ray.  EKG: EKG shows sinus rhythm at a rate of 99 Consults: Case discussed with Dr. Hal Hope for admission MDM: Patient here with known COVID-19 infection arrives hypoxic at 82% on room air requiring at least 3 L of oxygen to maintain oxygen saturations between 89 and 92%.  She has multiple bilateral pulmonary emboli including embolus in the main pulmonary artery.  She does not have any right heart strain.  She would be at admitted to the stepdown unit by Dr. Hal Hope.  She is critically ill. Patient disposition:The patient appears reasonably stabilized for admission  considering the current resources, flow, and capabilities available in the ED at this time, and I doubt any other [facility] requiring further screening and/or treatment in the ED prior to admission.        Final Clinical Impression(s) / ED Diagnoses Final diagnoses:  None    Rx / DC Orders ED Discharge Orders     None        Margarita Mail, PA-C 06/20/21 0032    Malvin Johns, MD 06/20/21 1316

## 2021-06-19 NOTE — Progress Notes (Signed)
ANTICOAGULATION CONSULT NOTE - Initial Consult  Pharmacy Consult for Heparin Indication: pulmonary embolus  Allergies  Allergen Reactions   Corn-Containing Products    Ibuprofen    Naproxen Sodium    Peanut-Containing Drug Products    Penicillins     Has patient had a PCN reaction causing immediate rash, facial/tongue/throat swelling, SOB or lightheadedness with hypotension: No Has patient had a PCN reaction causing severe rash involving mucus membranes or skin necrosis: No Has patient had a PCN reaction that required hospitalization No Has patient had a PCN reaction occurring within the last 10 years: No If all of the above answers are "NO", then may proceed with Cephalosporin use.    Sulfonamide Derivatives     Patient Measurements: Height: '5\' 6"'$  (167.6 cm) Weight: 102.5 kg (226 lb) IBW/kg (Calculated) : 59.3 Heparin Dosing Weight: 82.6 kg  Vital Signs: Temp: 98.6 F (37 C) (07/30 1652) Temp Source: Oral (07/30 1652) BP: 126/84 (07/30 2030) Pulse Rate: 95 (07/30 2030)  Labs: Recent Labs    06/19/21 1700 06/19/21 1927  HGB 9.3*  --   HCT 30.8*  --   PLT 333  --   CREATININE 0.92  --   TROPONINIHS 10 13    Estimated Creatinine Clearance: 87.5 mL/min (by C-G formula based on SCr of 0.92 mg/dL).   Medical History: Past Medical History:  Diagnosis Date   Allergy    CARDIAC MURMUR 06/22/2009   Mitral regurgitation   CHICKENPOX, HX OF 06/22/2009   Cough 09/17/2009   Effusion of ankle and foot joint 06/22/2009   EPISCLERITIS 06/22/2009   Headache(784.0) 06/22/2009   HEMORRHOIDS, INTERNAL 06/23/2003   Hyperlipidemia    HYPERTENSION 06/22/2009   IC (interstitial cystitis)    MIGRAINE HEADACHE 06/22/2009   PLANTAR WART, LEFT 08/03/2009   Premature ovarian failure    Unspecified vitamin D deficiency 06/22/2009   Urinary frequency 06/22/2009   UTI'S, HX OF 06/22/2009   Medications:  (Not in a hospital admission)  Scheduled:  Infusions:   Assessment: 1 yof presenting with  shortness of breath. Patient with recent COVID diagnosis on 06/15/21 and was prescribed Paxlovid. CTA w/ Extensive bilateral pulmonary embolism. Heparin per pharmacy consult placed.  Hgb 9.3. plt 333. Patient not on anticoagulation prior to arrival.  Goal of Therapy:  Heparin level 0.3-0.7 units/ml Monitor platelets by anticoagulation protocol: Yes   Plan:  Give 6000 units bolus x 1 Start heparin infusion at 1500 units/hr Check anti-Xa level in 6-8 hours and daily while on heparin Continue to monitor H&H and platelets  Lorelei Pont, PharmD, BCPS 06/19/2021 9:21 PM ED Clinical Pharmacist -  [phone]

## 2021-06-19 NOTE — ED Triage Notes (Signed)
"  Diagnosed with Covid on Tuesday, saw at PCP for cough, started to get short of breath last night and it just keeps getting worse" per pt

## 2021-06-20 DIAGNOSIS — I82451 Acute embolism and thrombosis of right peroneal vein: Secondary | ICD-10-CM | POA: Diagnosis not present

## 2021-06-20 DIAGNOSIS — Z91010 Allergy to peanuts: Secondary | ICD-10-CM | POA: Diagnosis not present

## 2021-06-20 DIAGNOSIS — Z79899 Other long term (current) drug therapy: Secondary | ICD-10-CM | POA: Diagnosis not present

## 2021-06-20 DIAGNOSIS — I2699 Other pulmonary embolism without acute cor pulmonale: Secondary | ICD-10-CM | POA: Diagnosis not present

## 2021-06-20 DIAGNOSIS — Z86711 Personal history of pulmonary embolism: Secondary | ICD-10-CM

## 2021-06-20 DIAGNOSIS — Z880 Allergy status to penicillin: Secondary | ICD-10-CM | POA: Diagnosis not present

## 2021-06-20 DIAGNOSIS — I10 Essential (primary) hypertension: Secondary | ICD-10-CM | POA: Diagnosis not present

## 2021-06-20 DIAGNOSIS — Z808 Family history of malignant neoplasm of other organs or systems: Secondary | ICD-10-CM | POA: Diagnosis not present

## 2021-06-20 DIAGNOSIS — Z833 Family history of diabetes mellitus: Secondary | ICD-10-CM | POA: Diagnosis not present

## 2021-06-20 DIAGNOSIS — I82462 Acute embolism and thrombosis of left calf muscular vein: Secondary | ICD-10-CM | POA: Diagnosis not present

## 2021-06-20 DIAGNOSIS — Z6841 Body Mass Index (BMI) 40.0 and over, adult: Secondary | ICD-10-CM | POA: Diagnosis not present

## 2021-06-20 DIAGNOSIS — R0602 Shortness of breath: Secondary | ICD-10-CM | POA: Diagnosis present

## 2021-06-20 DIAGNOSIS — R7303 Prediabetes: Secondary | ICD-10-CM | POA: Diagnosis not present

## 2021-06-20 DIAGNOSIS — Z882 Allergy status to sulfonamides status: Secondary | ICD-10-CM | POA: Diagnosis not present

## 2021-06-20 DIAGNOSIS — Z91018 Allergy to other foods: Secondary | ICD-10-CM | POA: Diagnosis not present

## 2021-06-20 DIAGNOSIS — U071 COVID-19: Secondary | ICD-10-CM | POA: Diagnosis not present

## 2021-06-20 DIAGNOSIS — Z8249 Family history of ischemic heart disease and other diseases of the circulatory system: Secondary | ICD-10-CM | POA: Diagnosis not present

## 2021-06-20 DIAGNOSIS — J9601 Acute respiratory failure with hypoxia: Secondary | ICD-10-CM | POA: Diagnosis not present

## 2021-06-20 DIAGNOSIS — Z886 Allergy status to analgesic agent status: Secondary | ICD-10-CM | POA: Diagnosis not present

## 2021-06-20 DIAGNOSIS — Z83438 Family history of other disorder of lipoprotein metabolism and other lipidemia: Secondary | ICD-10-CM | POA: Diagnosis not present

## 2021-06-20 DIAGNOSIS — E039 Hypothyroidism, unspecified: Secondary | ICD-10-CM | POA: Diagnosis not present

## 2021-06-20 DIAGNOSIS — Z8041 Family history of malignant neoplasm of ovary: Secondary | ICD-10-CM | POA: Diagnosis not present

## 2021-06-20 DIAGNOSIS — D509 Iron deficiency anemia, unspecified: Secondary | ICD-10-CM | POA: Diagnosis not present

## 2021-06-20 DIAGNOSIS — Z79890 Hormone replacement therapy (postmenopausal): Secondary | ICD-10-CM | POA: Diagnosis not present

## 2021-06-20 DIAGNOSIS — E785 Hyperlipidemia, unspecified: Secondary | ICD-10-CM | POA: Diagnosis not present

## 2021-06-20 MED ORDER — DM-GUAIFENESIN ER 30-600 MG PO TB12
1.0000 | ORAL_TABLET | Freq: Two times a day (BID) | ORAL | Status: DC | PRN
  Filled 2021-06-20: qty 1

## 2021-06-20 MED ORDER — PRAVASTATIN SODIUM 10 MG PO TABS: 20.0000 mg | ORAL_TABLET | Freq: Every day | ORAL | Status: DC

## 2021-06-20 MED ORDER — PSEUDOEPHEDRINE HCL 30 MG PO TABS
30.0000 mg | ORAL_TABLET | ORAL | Status: DC | PRN
  Filled 2021-06-20: qty 1

## 2021-06-20 MED ORDER — CHLORZOXAZONE 500 MG PO TABS
500.0000 mg | ORAL_TABLET | Freq: Three times a day (TID) | ORAL | Status: DC | PRN
  Filled 2021-06-20: qty 1

## 2021-06-20 MED ORDER — NIRMATRELVIR/RITONAVIR (PAXLOVID)TABLET
3.0000 | ORAL_TABLET | Freq: Two times a day (BID) | ORAL | Status: DC
  Administered 2021-06-20 – 2021-06-22 (×4): 3 via ORAL
  Filled 2021-06-20: qty 30

## 2021-06-20 MED ORDER — IPRATROPIUM BROMIDE HFA 17 MCG/ACT IN AERS
2.0000 | INHALATION_SPRAY | Freq: Once | RESPIRATORY_TRACT | Status: AC
  Administered 2021-06-20: 2 via RESPIRATORY_TRACT
  Filled 2021-06-20: qty 12.9

## 2021-06-20 MED ORDER — PREDNISONE 20 MG PO TABS: 20.0000 mg | ORAL_TABLET | Freq: Every day | ORAL | Status: DC

## 2021-06-20 MED ORDER — IPRATROPIUM-ALBUTEROL 20-100 MCG/ACT IN AERS
1.0000 | INHALATION_SPRAY | Freq: Four times a day (QID) | RESPIRATORY_TRACT | Status: DC
  Administered 2021-06-20 – 2021-06-22 (×7): 1 via RESPIRATORY_TRACT
  Filled 2021-06-20: qty 4

## 2021-06-20 MED ORDER — IRBESARTAN 150 MG PO TABS
150.0000 mg | ORAL_TABLET | Freq: Every day | ORAL | Status: DC
  Administered 2021-06-20 – 2021-06-22 (×3): 150 mg via ORAL
  Filled 2021-06-20 (×3): qty 1

## 2021-06-20 MED ORDER — PREDNISONE 20 MG PO TABS
30.0000 mg | ORAL_TABLET | Freq: Every day | ORAL | Status: AC
  Administered 2021-06-22: 30 mg via ORAL
  Filled 2021-06-20: qty 1

## 2021-06-20 MED ORDER — MONTELUKAST SODIUM 10 MG PO TABS
10.0000 mg | ORAL_TABLET | Freq: Every day | ORAL | Status: DC
  Administered 2021-06-20 – 2021-06-22 (×3): 10 mg via ORAL
  Filled 2021-06-20 (×3): qty 1

## 2021-06-20 MED ORDER — IBUPROFEN 600 MG PO TABS: 800.0000 mg | ORAL_TABLET | Freq: Three times a day (TID) | ORAL | Status: DC | PRN

## 2021-06-20 MED ORDER — ESCITALOPRAM OXALATE 10 MG PO TABS
20.0000 mg | ORAL_TABLET | Freq: Every day | ORAL | Status: DC
  Administered 2021-06-20 – 2021-06-21 (×2): 20 mg via ORAL
  Filled 2021-06-20 (×2): qty 2

## 2021-06-20 MED ORDER — PREDNISONE 10 MG (21) PO TBPK: 10.0000 mg | ORAL_TABLET | ORAL | Status: DC

## 2021-06-20 MED ORDER — PREDNISONE 20 MG PO TABS
40.0000 mg | ORAL_TABLET | Freq: Every day | ORAL | Status: AC
  Administered 2021-06-21: 40 mg via ORAL
  Filled 2021-06-20: qty 2

## 2021-06-20 MED ORDER — PREDNISONE 20 MG PO TABS
50.0000 mg | ORAL_TABLET | Freq: Every day | ORAL | Status: AC
  Administered 2021-06-20: 50 mg via ORAL
  Filled 2021-06-20: qty 2

## 2021-06-20 MED ORDER — LEVOTHYROXINE SODIUM 25 MCG PO TABS
25.0000 ug | ORAL_TABLET | Freq: Every day | ORAL | Status: DC
  Administered 2021-06-21 – 2021-06-22 (×2): 25 ug via ORAL
  Filled 2021-06-20 (×2): qty 1

## 2021-06-20 MED ORDER — POTASSIUM CHLORIDE CRYS ER 20 MEQ PO TBCR
40.0000 meq | EXTENDED_RELEASE_TABLET | Freq: Two times a day (BID) | ORAL | Status: AC
  Administered 2021-06-20 – 2021-06-21 (×2): 40 meq via ORAL
  Filled 2021-06-20 (×2): qty 2

## 2021-06-20 MED ORDER — NIRMATRELVIR & RITONAVIR 10 X 150 MG & 10 X 100MG PO TBPK: 2.0000 | ORAL_TABLET | Freq: Two times a day (BID) | ORAL | Status: DC

## 2021-06-20 MED ORDER — PREDNISONE 10 MG PO TABS: 10.0000 mg | ORAL_TABLET | Freq: Every day | ORAL | Status: DC

## 2021-06-20 MED ORDER — PNEUMOCOCCAL VAC POLYVALENT 25 MCG/0.5ML IJ INJ
0.5000 mL | INJECTION | INTRAMUSCULAR | Status: DC | PRN
  Filled 2021-06-20: qty 0.5

## 2021-06-20 MED ORDER — HYDROCODONE-HOMATROPINE 5-1.5 MG/5ML PO SYRP
5.0000 mL | ORAL_SOLUTION | Freq: Four times a day (QID) | ORAL | Status: DC | PRN
  Filled 2021-06-20: qty 5

## 2021-06-20 MED ORDER — ENOXAPARIN SODIUM 100 MG/ML IJ SOSY
100.0000 mg | PREFILLED_SYRINGE | Freq: Two times a day (BID) | INTRAMUSCULAR | Status: DC
  Administered 2021-06-20 – 2021-06-21 (×3): 100 mg via SUBCUTANEOUS
  Filled 2021-06-20 (×3): qty 1

## 2021-06-20 MED ORDER — POLYETHYLENE GLYCOL 3350 17 G PO PACK
17.0000 g | PACK | Freq: Every day | ORAL | Status: DC
  Filled 2021-06-20: qty 1

## 2021-06-20 NOTE — ED Provider Notes (Signed)
Kara Cannon has COVID-19 and multiple pulmonary embolisms.  She is on 4 L nasal cannula.  Awaiting a bed, and unfortunately, her heparin drip cannot be adequately titrated here.  The PT/PTT labs have to be run at [facility].  Unfortunately, the time it takes to return the value is greater than the interval required for titration.  Therefore, we will discontinue heparin and start Lovenox.   Arnaldo Natal, MD 06/20/21 [id number]

## 2021-06-20 NOTE — ED Notes (Addendum)
Pt reassessed. Left AC iv removed by pt accidentally. Bed linens replaced and, pt repositioned. Assisted to restroom. New gown given. Bedside commode emptied. Snacks and water given. Pt comfortable in bed. Lights turned down to promote a resting environment. New IV inserted. Belongings gathered and placed in pt specific bag. Nonslip footwear applied.

## 2021-06-20 NOTE — ED Notes (Signed)
Called Bed Placement at 9 to check on bed Margo said it will probably be an hour or more have several that are waiting for beds.

## 2021-06-20 NOTE — H&P (Signed)
History and Physical    Kara Cannon [id number] DOB: 1971-10-19 DOA: 06/19/2021  PCP: Robyne Peers, MD (Confirm with patient/family/NH records and if not entered, this has to be entered at [facility] point of entry) Patient coming from: Home  I have personally briefly reviewed patient's old medical records in [place]  Chief Complaint: Feeling better  HPI: Kara Cannon is a 50 y.o. female with medical history significant of recently diagnosed COVID, strong family history of hypercoagulable state, HTN, HLD, morbid obesity presented with persistent cough and increasing shortness of breath.  Patient started to develop cough dry cough, on Monday and went to tested positive for COVID Tuesday.  For last 3 days patient has been taking Paxlovid and steroid, despite, he she continues experience a dry cough and yesterday, she developed increasing shortness of breath.  Even short distance walk triggered significant trouble to catch breath.  No chest pains no fever chills no diarrhea.  ED Course: O2 saturation in the mid 80s, stabilized on 4 L oxygen.  No hypotension no tachycardia.  CTA positive for multifocal pulmonary embolism, involving the right main pulmonary artery right upper middle and lower lobe, and segmental branch of the left upper lobe and peripheral left lower lobe and left lung base.  No signs of right heart strain on CTA.  Review of Systems: As per HPI otherwise 14 point review of systems negative.    Past Medical History:  Diagnosis Date   Allergy    CARDIAC MURMUR 06/22/2009   Mitral regurgitation   CHICKENPOX, HX OF 06/22/2009   Cough 09/17/2009   Effusion of ankle and foot joint 06/22/2009   EPISCLERITIS 06/22/2009   Headache(784.0) 06/22/2009   HEMORRHOIDS, INTERNAL 06/23/2003   Hyperlipidemia    HYPERTENSION 06/22/2009   IC (interstitial cystitis)    MIGRAINE HEADACHE 06/22/2009   PLANTAR WART, LEFT 08/03/2009   Premature ovarian failure    Unspecified vitamin D  deficiency 06/22/2009   Urinary frequency 06/22/2009   UTI'S, HX OF 06/22/2009    Past Surgical History:  Procedure Laterality Date   CYSTOSCOPY  02/2009   Dr. Jeffie Pollock     reports that she has never smoked. She has never used smokeless tobacco. She reports that she does not drink alcohol and does not use drugs.  Allergies  Allergen Reactions   Corn-Containing Products    Ibuprofen    Naproxen Sodium    Peanut-Containing Drug Products    Penicillins     Has patient had a PCN reaction causing immediate rash, facial/tongue/throat swelling, SOB or lightheadedness with hypotension: No Has patient had a PCN reaction causing severe rash involving mucus membranes or skin necrosis: No Has patient had a PCN reaction that required hospitalization No Has patient had a PCN reaction occurring within the last 10 years: No If all of the above answers are "NO", then may proceed with Cephalosporin use.    Sulfonamide Derivatives     Family History  Problem Relation Age of Onset   Skin cancer Maternal Uncle    Hypertension Mother    Hypertension Father    Ovarian cancer Paternal Grandmother    Cancer Paternal Grandmother    Diabetes Maternal Grandmother    Hyperlipidemia Maternal Grandmother      Prior to Admission medications   Medication Sig Start Date End Date Taking? Authorizing Provider  chlorzoxazone (PARAFON) 500 MG tablet Take 500 mg by mouth 3 (three) times daily as needed for muscle spasms.   Yes [provider]  dextromethorphan-guaiFENesin (MUCINEX DM) 30-600 MG 12hr tablet Take 1 tablet by mouth 2 (two) times daily as needed for cough.   Yes [provider]  escitalopram (LEXAPRO) 20 MG tablet Take 20 mg by mouth daily. 01/03/21  Yes [provider]  HYDROcodone-homatropine (HYCODAN) 5-1.5 MG/5ML syrup Take 5 mLs by mouth every 6 (six) hours as needed for cough.  12/14/15  Yes [provider]  ibuprofen (ADVIL) 800 MG tablet Take 800 mg by mouth 3  (three) times daily as needed for moderate pain. 02/04/21  Yes [provider]  levothyroxine (SYNTHROID) 25 MCG tablet Take 25 mcg by mouth daily before breakfast.   Yes [provider]  montelukast (SINGULAIR) 10 MG tablet Take 10 mg by mouth daily. 06/15/21  Yes [provider]  Nirmatrelvir-Ritonavir (PAXLOVID PO) Take 3 tablets by mouth in the morning and at bedtime. 300/100 MG   Yes [provider]  pravastatin (PRAVACHOL) 20 MG tablet Take 20 mg by mouth at bedtime.  11/04/15  Yes [provider]  predniSONE (STERAPRED UNI-PAK 21 TAB) 10 MG (21) TBPK tablet Take 10-60 mg by mouth as directed. Take 6 tablets on Day 1 Take 5 tablets on Day 2 Take 4 tablets on Day 3 Take 3 tablets on Day 4 Take 2 tablets on Day 5 Take 1 tablet on Day 6 06/15/21  Yes [provider]  pseudoephedrine (SUDAFED) 30 MG tablet Take 30 mg by mouth every 4 (four) hours as needed for congestion.   Yes [provider]  telmisartan (MICARDIS) 40 MG tablet Take 40 mg by mouth daily.   Yes [provider]  EPINEPHrine 0.3 mg/0.3 mL IJ SOAJ injection Inject 0.3 mg into the muscle as needed for anaphylaxis. 06/11/21   [provider]  famotidine (PEPCID) 40 MG/5ML suspension Take 2.5 mLs (20 mg total) by mouth 2 (two) times daily. Patient not taking: No sig reported 05/24/21   Wieters, Hallie C, PA-C  predniSONE (DELTASONE) 10 MG tablet Begin with 6 tabs on day 1, 5 tab on day 2, 4 tab on day 3, 3 tab on day 4, 2 tab on day 5, 1 tab on day 6-take with food Patient not taking: No sig reported 05/24/21   Wieters, Hallie C, PA-C  traMADol (ULTRAM) 50 MG tablet Take 1 tablet (50 mg total) by mouth every 6 (six) hours as needed. Patient not taking: No sig reported 05/24/21   Wieters, Hallie C, PA-C  dicyclomine (BENTYL) 20 MG tablet Take 1 tablet (20 mg total) by mouth every 6 (six) hours. Patient not taking: Reported on 12/17/2015 09/22/14 12/18/15  Ferman Hamming L, DO  hydrochlorothiazide (HYDRODIURIL) 12.5 MG tablet Take 1 tablet (12.5 mg total) by mouth daily. Patient not taking: Reported on 12/17/2015 04/18/14 12/18/15  Shawna Orleans, Doe-Hyun R, DO  ipratropium (ATROVENT) 0.03 % nasal spray Place 2 sprays into the nose 4 (four) times daily. Patient not taking: Reported on 12/17/2015 11/28/13 12/18/15  Shawnee Knapp, MD  potassium chloride (K-DUR) 10 MEQ tablet Take 1 tablet (10 mEq total) by mouth 2 (two) times daily. Patient not taking: Reported on 12/17/2015 05/15/13 12/18/15  Rosine Abe, DO    Physical Exam: Vitals:   06/20/21 1155 06/20/21 1157 06/20/21 1430 06/20/21 1524  BP: 129/79  129/80   Pulse: 73  93   Resp: 16  17   Temp: 97.7 F (36.5 C)  98.2 F (36.8 C) 97.9 F (36.6 C)  TempSrc: Oral  Oral  SpO2: 99% 99% 100%   Weight:    101.5 kg  Height:    5' (1.524 m)    Constitutional: NAD, calm, comfortable Vitals:   06/20/21 1155 06/20/21 1157 06/20/21 1430 06/20/21 1524  BP: 129/79  129/80   Pulse: 73  93   Resp: 16  17   Temp: 97.7 F (36.5 C)  98.2 F (36.8 C) 97.9 F (36.6 C)  TempSrc: Oral  Oral   SpO2: 99% 99% 100%   Weight:    101.5 kg  Height:    5' (1.524 m)   Eyes: PERRL, lids and conjunctivae normal ENMT: Mucous membranes are moist. Posterior pharynx clear of any exudate or lesions.Normal dentition.  Neck: normal, supple, no masses, no thyromegaly Respiratory: clear to auscultation bilaterally, no wheezing, no crackles. Normal respiratory effort. No accessory muscle use.  Cardiovascular: Regular rate and rhythm, no murmurs / rubs / gallops. No extremity edema. 2+ pedal pulses. No carotid bruits.  Abdomen: no tenderness, no masses palpated. No hepatosplenomegaly. Bowel sounds positive.  Musculoskeletal: no clubbing / cyanosis. No joint deformity upper and lower extremities. Good ROM, no contractures. Normal muscle tone.  Skin: no rashes, lesions, ulcers. No induration Neurologic: CN 2-12 grossly intact. Sensation  intact, DTR normal. Strength 5/5 in all 4.  Psychiatric: Normal judgment and insight. Alert and oriented x 3. Normal mood.    Labs on Admission: I have personally reviewed following labs and imaging studies  CBC: Recent Labs  Lab 06/19/21 1700  WBC 5.3  NEUTROABS 2.3  HGB 9.3*  HCT 30.8*  MCV 75.3*  PLT 0000000   Basic Metabolic Panel: Recent Labs  Lab 06/19/21 1700  NA 138  K 2.9*  CL 99  CO2 27  GLUCOSE 124*  BUN 14  CREATININE 0.92  CALCIUM 8.2*   GFR: Estimated Creatinine Clearance: 77.5 mL/min (by C-G formula based on SCr of 0.92 mg/dL). Liver Function Tests: Recent Labs  Lab 06/19/21 1700  AST 29  ALT 19  ALKPHOS 63  BILITOT 0.5  PROT 7.4  ALBUMIN 3.5   No results for input(s): LIPASE, AMYLASE in the last 168 hours. No results for input(s): AMMONIA in the last 168 hours. Coagulation Profile: Recent Labs  Lab 06/19/21 2140  INR 1.0   Cardiac Enzymes: No results for input(s): CKTOTAL, CKMB, CKMBINDEX, TROPONINI in the last 168 hours. BNP (last 3 results) No results for input(s): PROBNP in the last 8760 hours. HbA1C: No results for input(s): HGBA1C in the last 72 hours. CBG: No results for input(s): GLUCAP in the last 168 hours. Lipid Profile: Recent Labs    06/19/21 1700  TRIG 189*   Thyroid Function Tests: No results for input(s): TSH, T4TOTAL, FREET4, T3FREE, THYROIDAB in the last 72 hours. Anemia Panel: Recent Labs    06/19/21 1700  FERRITIN 32   Urine analysis:    Component Value Date/Time   COLORURINE YELLOW 12/17/2015 2258   APPEARANCEUR CLOUDY (A) 12/17/2015 2258   LABSPEC 1.037 (H) 12/17/2015 2258   PHURINE 5.0 12/17/2015 2258   GLUCOSEU NEGATIVE 12/17/2015 2258   GLUCOSEU NEGATIVE 06/22/2009 1050   HGBUR NEGATIVE 12/17/2015 2258   BILIRUBINUR NEGATIVE 12/17/2015 2258   BILIRUBINUR neg 09/22/2014 1930   KETONESUR NEGATIVE 12/17/2015 2258   PROTEINUR NEGATIVE 12/17/2015 2258   UROBILINOGEN 0.2 09/22/2014 1930    UROBILINOGEN 0.2 06/22/2009 1050   NITRITE NEGATIVE 12/17/2015 2258   LEUKOCYTESUR NEGATIVE 12/17/2015 2258    Radiological Exams on Admission: CT Angio Chest PE W and/or Wo  Contrast  Result Date: 06/19/2021 CLINICAL DATA:  Shortness of breath beginning yesterday. Cough. Covid-19 viral infection. EXAM: CT ANGIOGRAPHY CHEST WITH CONTRAST TECHNIQUE: Multidetector CT imaging of the chest was performed using the standard protocol during bolus administration of intravenous contrast. Multiplanar CT image reconstructions and MIPs were obtained to evaluate the vascular anatomy. CONTRAST:  183m OMNIPAQUE IOHEXOL 350 MG/ML SOLN COMPARISON:  None. FINDINGS: Cardiovascular: Extensive bilateral pulmonary embolism is seen, with involvement of the right main pulmonary artery, and right upper, middle, and lower lobar artery branches. Pulmonary embolism is also seen in segmental branches of the left upper lobe pulmonary artery, and peripheral left lower lobe branches in the left lung base. No CT evidence of right heart strain, with RV/LV ratio of 0.6 within normal limits. Mediastinum/Nodes: No masses or pathologically enlarged lymph nodes identified. Lungs/Pleura: Bilateral lower lobe atelectasis. No evidence of pulmonary mass or consolidation. No evidence of pleural effusion. Upper abdomen: No acute findings. Musculoskeletal: No suspicious bone lesions identified. Review of the MIP images confirms the above findings. IMPRESSION: Extensive bilateral pulmonary embolism. No CT evidence of right heart strain. Bilateral lower lobe atelectasis. Critical Value/emergent results were called by telephone at the time of interpretation on 06/19/2021 at 8:44 pm to provider Dr. BTamera Punt who verbally acknowledged these results. Electronically Signed   By: JMarlaine HindM.D.   On: 06/19/2021 20:46   DG Chest Port 1 View  Result Date: 06/19/2021 CLINICAL DATA:  COVID positive. Hypoxia. Cough and congestion. Shortness of breath. EXAM:  PORTABLE CHEST 1 VIEW COMPARISON:  Radiograph 09/17/2009 FINDINGS: Lung volumes are low. Left basilar assessment is limited due to overlapping soft tissue structures. Borderline cardiomegaly. Mild peribronchial thickening. Patchy bibasilar opacities, with left lung base not well assessed. No pneumothorax. No large pleural effusion. No acute osseous abnormalities are seen. IMPRESSION: 1. Low lung volumes with patchy bibasilar opacities, atelectasis versus pneumonia. Left basilar assessment is suboptimal due to overlapping soft tissues. 2. Mild peribronchial thickening. Electronically Signed   By: MKeith RakeM.D.   On: 06/19/2021 19:49    EKG: Independently reviewed.  Sinus, similar ST-T changes as in 2015. Assessment/Plan Active Problems:   [place]   Pulmonary emboli ([facility])  (please populate well all problems here in Problem List. (For example, if patient is on BP meds at home and you resume or decide to hold them, it is a problem that needs to be her. Same for CAD, COPD, HLD and so on)  Acute hypoxic respite failure -Combined effect of COVID-19 infection and multifocal pulmonary embolism. -Now stabilized on 3 L with O2 saturation 100%.  Expect further improving, likely can go home tomorrow after PE work-up including echocardiogram and DVT study. May need to go home with short course home O2.  Multifocal PE -Provoked secondary to COVID infection.  However patient reported that she has more than 3 family members recently diagnosed with pulmonary embolism including her mom and her uncle, and she was screened outpatient for hyper coagulable state which came back negative but she argue that her uncle was also screened negative for hypercoagulable state, developed PE this year.  I recommend complete 3 to 6 months of provoked PE treatment and talk to her hematologist as outpatient to decide further anticoagulation/DVT prophylaxis plan.  Patient expressed understanding and  agreed.  COVID infection -Continue Paxlovid -Steroid and other supporting medications.  Microcytic anemia -No recent H&H reading in 7 years, patient denied any abdominal pain, no history of black tarry stool. -Check FOBT and iron  study.  Outpatient GI follow-up.  HTN -Controlled continue home meds.  Hypothyroidism -Continue Synthroid  Morbid obesity -Calorie control.  DVT prophylaxis: Lovenox twice daily Code Status: Full Code Family Communication: None at bedside Disposition Plan: Expect less than 2 midnight hospital stay Consults called: None Admission status: Telemetry observation   Lequita Halt MD [facility] Pager [phone]  06/20/2021, 7:44 PM

## 2021-06-21 ENCOUNTER — Ambulatory Visit (HOSPITAL_COMMUNITY): Payer: Managed Care, Other (non HMO)

## 2021-06-21 ENCOUNTER — Observation Stay (HOSPITAL_COMMUNITY): Payer: Managed Care, Other (non HMO)

## 2021-06-21 DIAGNOSIS — Z91010 Allergy to peanuts: Secondary | ICD-10-CM | POA: Diagnosis not present

## 2021-06-21 DIAGNOSIS — Z83438 Family history of other disorder of lipoprotein metabolism and other lipidemia: Secondary | ICD-10-CM | POA: Diagnosis not present

## 2021-06-21 DIAGNOSIS — U071 COVID-19: Secondary | ICD-10-CM

## 2021-06-21 DIAGNOSIS — Z91018 Allergy to other foods: Secondary | ICD-10-CM | POA: Diagnosis not present

## 2021-06-21 DIAGNOSIS — R7989 Other specified abnormal findings of blood chemistry: Secondary | ICD-10-CM | POA: Diagnosis not present

## 2021-06-21 DIAGNOSIS — Z79899 Other long term (current) drug therapy: Secondary | ICD-10-CM | POA: Diagnosis not present

## 2021-06-21 DIAGNOSIS — Z6841 Body Mass Index (BMI) 40.0 and over, adult: Secondary | ICD-10-CM | POA: Diagnosis not present

## 2021-06-21 DIAGNOSIS — Z833 Family history of diabetes mellitus: Secondary | ICD-10-CM | POA: Diagnosis not present

## 2021-06-21 DIAGNOSIS — I2609 Other pulmonary embolism with acute cor pulmonale: Secondary | ICD-10-CM | POA: Diagnosis not present

## 2021-06-21 DIAGNOSIS — I2699 Other pulmonary embolism without acute cor pulmonale: Secondary | ICD-10-CM

## 2021-06-21 DIAGNOSIS — J9601 Acute respiratory failure with hypoxia: Secondary | ICD-10-CM | POA: Diagnosis present

## 2021-06-21 DIAGNOSIS — I10 Essential (primary) hypertension: Secondary | ICD-10-CM | POA: Diagnosis present

## 2021-06-21 DIAGNOSIS — E785 Hyperlipidemia, unspecified: Secondary | ICD-10-CM | POA: Diagnosis present

## 2021-06-21 DIAGNOSIS — Z886 Allergy status to analgesic agent status: Secondary | ICD-10-CM | POA: Diagnosis not present

## 2021-06-21 DIAGNOSIS — Z79890 Hormone replacement therapy (postmenopausal): Secondary | ICD-10-CM | POA: Diagnosis not present

## 2021-06-21 DIAGNOSIS — Z882 Allergy status to sulfonamides status: Secondary | ICD-10-CM | POA: Diagnosis not present

## 2021-06-21 DIAGNOSIS — E039 Hypothyroidism, unspecified: Secondary | ICD-10-CM | POA: Diagnosis present

## 2021-06-21 DIAGNOSIS — D509 Iron deficiency anemia, unspecified: Secondary | ICD-10-CM | POA: Diagnosis present

## 2021-06-21 DIAGNOSIS — Z808 Family history of malignant neoplasm of other organs or systems: Secondary | ICD-10-CM | POA: Diagnosis not present

## 2021-06-21 DIAGNOSIS — I82451 Acute embolism and thrombosis of right peroneal vein: Secondary | ICD-10-CM | POA: Diagnosis present

## 2021-06-21 DIAGNOSIS — Z8249 Family history of ischemic heart disease and other diseases of the circulatory system: Secondary | ICD-10-CM | POA: Diagnosis not present

## 2021-06-21 DIAGNOSIS — R0602 Shortness of breath: Secondary | ICD-10-CM | POA: Diagnosis present

## 2021-06-21 DIAGNOSIS — I82462 Acute embolism and thrombosis of left calf muscular vein: Secondary | ICD-10-CM | POA: Diagnosis present

## 2021-06-21 DIAGNOSIS — Z8041 Family history of malignant neoplasm of ovary: Secondary | ICD-10-CM | POA: Diagnosis not present

## 2021-06-21 DIAGNOSIS — R7303 Prediabetes: Secondary | ICD-10-CM | POA: Diagnosis present

## 2021-06-21 DIAGNOSIS — J9690 Respiratory failure, unspecified, unspecified whether with hypoxia or hypercapnia: Secondary | ICD-10-CM | POA: Diagnosis present

## 2021-06-21 DIAGNOSIS — Z880 Allergy status to penicillin: Secondary | ICD-10-CM | POA: Diagnosis not present

## 2021-06-21 LAB — CBC WITH DIFFERENTIAL/PLATELET
Abs Immature Granulocytes: 0.04 10*3/uL (ref 0.00–0.07)
Basophils Absolute: 0 10*3/uL (ref 0.0–0.1)
Basophils Relative: 0 %
Eosinophils Absolute: 0 10*3/uL (ref 0.0–0.5)
Eosinophils Relative: 0 %
HCT: 29.3 % — ABNORMAL LOW (ref 36.0–46.0)
Hemoglobin: 8.8 g/dL — ABNORMAL LOW (ref 12.0–15.0)
Immature Granulocytes: 1 %
Lymphocytes Relative: 15 %
Lymphs Abs: 1.1 10*3/uL (ref 0.7–4.0)
MCH: 23 pg — ABNORMAL LOW (ref 26.0–34.0)
MCHC: 30 g/dL (ref 30.0–36.0)
MCV: 76.5 fL — ABNORMAL LOW (ref 80.0–100.0)
Monocytes Absolute: 0.5 10*3/uL (ref 0.1–1.0)
Monocytes Relative: 6 %
Neutro Abs: 5.8 10*3/uL (ref 1.7–7.7)
Neutrophils Relative %: 78 %
Platelets: 357 10*3/uL (ref 150–400)
RBC: 3.83 MIL/uL — ABNORMAL LOW (ref 3.87–5.11)
RDW: 17.8 % — ABNORMAL HIGH (ref 11.5–15.5)
WBC: 7.4 10*3/uL (ref 4.0–10.5)
nRBC: 0 % (ref 0.0–0.2)

## 2021-06-21 LAB — COMPREHENSIVE METABOLIC PANEL
ALT: 16 U/L (ref 0–44)
AST: 17 U/L (ref 15–41)
Albumin: 3.2 g/dL — ABNORMAL LOW (ref 3.5–5.0)
Alkaline Phosphatase: 57 U/L (ref 38–126)
Anion gap: 10 (ref 5–15)
BUN: 14 mg/dL (ref 6–20)
CO2: 25 mmol/L (ref 22–32)
Calcium: 8.8 mg/dL — ABNORMAL LOW (ref 8.9–10.3)
Chloride: 103 mmol/L (ref 98–111)
Creatinine, Ser: 0.69 mg/dL (ref 0.44–1.00)
GFR, Estimated: >60 mL/min (ref >60)
Glucose, Bld: 184 mg/dL — ABNORMAL HIGH (ref 70–99)
Potassium: 4.1 mmol/L (ref 3.5–5.1)
Sodium: 138 mmol/L (ref 135–145)
Total Bilirubin: 0.6 mg/dL (ref 0.3–1.2)
Total Protein: 6.9 g/dL (ref 6.5–8.1)

## 2021-06-21 LAB — D-DIMER, QUANTITATIVE: D-Dimer, Quant: 4.61 ug/mL-FEU — ABNORMAL HIGH (ref 0.00–0.50)

## 2021-06-21 LAB — C-REACTIVE PROTEIN: CRP: 3.6 mg/dL — ABNORMAL HIGH (ref <1.0)

## 2021-06-21 LAB — IRON AND TIBC
Iron: 21 ug/dL — ABNORMAL LOW (ref 28–170)
Saturation Ratios: 5 % — ABNORMAL LOW (ref 10.4–31.8)
TIBC: 426 ug/dL (ref 250–450)
UIBC: 405 ug/dL

## 2021-06-21 LAB — ECHOCARDIOGRAM COMPLETE
Area-P 1/2: 3.17 cm2
Height: 60 in
S' Lateral: 3 cm
Weight: 3580.8 oz

## 2021-06-21 LAB — MAGNESIUM: Magnesium: 2.4 mg/dL (ref 1.7–2.4)

## 2021-06-21 LAB — HIV ANTIBODY (ROUTINE TESTING W REFLEX): HIV Screen 4th Generation wRfx: NONREACTIVE

## 2021-06-21 LAB — PHOSPHORUS: Phosphorus: 3.7 mg/dL (ref 2.5–4.6)

## 2021-06-21 LAB — FERRITIN: Ferritin: 27 ng/mL (ref 11–307)

## 2021-06-21 MED ORDER — ENOXAPARIN SODIUM 100 MG/ML IJ SOSY
100.0000 mg | PREFILLED_SYRINGE | Freq: Two times a day (BID) | INTRAMUSCULAR | Status: DC
  Administered 2021-06-21 – 2021-06-22 (×2): 100 mg via SUBCUTANEOUS
  Filled 2021-06-21 (×2): qty 1

## 2021-06-21 MED ORDER — LIDOCAINE HCL (PF) 1 % IJ SOLN
INTRAMUSCULAR | Status: AC
  Filled 2021-06-21: qty 30

## 2021-06-21 MED ORDER — FERROUS SULFATE 325 (65 FE) MG PO TABS
325.0000 mg | ORAL_TABLET | Freq: Two times a day (BID) | ORAL | Status: DC
  Administered 2021-06-21 – 2021-06-22 (×2): 325 mg via ORAL
  Filled 2021-06-21 (×2): qty 1

## 2021-06-21 NOTE — Progress Notes (Signed)
  Echocardiogram 2D Echocardiogram has been performed.  Kara Cannon 06/21/2021, 9:47 AM

## 2021-06-21 NOTE — Progress Notes (Addendum)
PROGRESS NOTE    Kara Cannon  [id number] DOB: 1971/08/24 DOA: 06/19/2021 PCP: Robyne Peers, MD   Chief Complaint  Patient presents with   Shortness of Breath    Brief Narrative: 50 year old female with history of recently diagnosed COVID, family history of hypercoagulability state, HTN, HLD, morbid obesity presented with cough, increasing shortness of breath.  She tested positive for COVID last Tuesday and for last 3 days prior to admission has been taking Paxlovid a week, steroid despite which continues to experience dry cough and developed increasing shortness of breath triggered by short walk. In the ED saturating in 82% on RA-needing 4 L nasal cannula CT positive for multifocal pulmonary embolism right main pulmonary artery right upper middle and lower lobe and segmental branch of the left upper lobe and perihilar left lower lobe and left lung base, no signs of right heart strain. Patient was placed on heparin drip and admitted for further management.  Subjective: Seen and examined this morning. Saturating well 98% on 2 L nasal cannula, afebrile, rest of the vitals stable CBC BMP stable except for anemia 8.8 with microcytosis, CRP 3.6 FROM 5.3, Pro-Cal was negative less than 0.1 with normal lactic acid Weaning oxygen this morning . Assessment & Plan:  Acute hypoxic respiratory failure COVID-19 infection-tested positive on 06/15/21 Multifocal PE: Hypoxia suspect due to PE mostly- although tested + for cvid. CRP is elevated some .Continue on treatment of PE and also COVID daily steroids Paxlovid ( taking since Friday) (pharmacy to manage to minimize interaction side effect ), cont bronchodilators incentive spirometry pulmonary toileting ambulate as tolerated.  Continue supplemental oxygen to maintain saturation at least 92%.  Monitor inflammatory markers CRP/d dimer improving. Recent Labs    06/19/21 1700 06/21/21 0452  DDIMER 11.13* 4.61*  FERRITIN 32 27  LDH 193*  --    CRP 5.3* 3.6*   Multifocal pulmonary embolism: Continue Lovenox therapeutic dose.DOAC once off paxlovid.  No heart strain on the CT, high-sensitivity troponin were negative. Echo pending.  Check duplex of the leg  Microcytic anemia no recent accidents in the last 7 years.  Monitor hemoglobin.  Anemia work-up shows normal ferritin 27-likely from COVID, iron is still low at 21.  FOBT pending will need to have outpatient follow-up with PCP,Age-appropriate cancer screening.   Including colonoscopy. Add Iron supplementation Recent Labs  Lab 06/19/21 1700 06/21/21 0452  HGB 9.3* 8.8*  HCT 30.8* 29.3*   Hypertension: Stable on irbesartan Hypothyroidism: Continue home Synthroid Morbid obesity BMI 43: Advise outpatient PCP follow-up, healthy lifestyle and weight loss  Diet Order             Diet Heart Room service appropriate? Yes; Fluid consistency: Thin  Diet effective now                 Patient's Body mass index is 43.71 kg/m. DVT prophylaxis: lovenox SQ Code Status:   Code Status: Full Code  Family Communication: plan of care discussed with patient at bedside.  Updated husband over the phone. Status is: Admitted as observation Patient remains hospitalized for ongoing management of acute hypoxic respiratory failure PE needing IV heparin oxygenation further care, and will need at least 2 midnight stay  Dispo: The patient is from: Home              Anticipated d/c is to: Home              Patient currently is not medically stable to d/c.   Difficult to  place patient No Unresulted Labs (From admission, onward)     Start     Ordered   06/21/21 0500  CBC with Differential/Platelet  Daily,   R      06/20/21 1943   06/21/21 0500  Comprehensive metabolic panel  Daily,   R      06/20/21 1943   06/21/21 0500  C-reactive protein  Daily,   R      06/20/21 1943   06/21/21 0500  D-dimer, quantitative  Daily,   R      06/20/21 1943   06/21/21 0500  Ferritin  Daily,   R      06/20/21  1943   06/21/21 0500  Magnesium  Daily,   R      06/20/21 1943   06/21/21 0500  Phosphorus  Daily,   R      06/20/21 1943   Unscheduled  Occult blood card to lab, stool  As needed,   R      06/20/21 1955           Medications reviewed:  Scheduled Meds:  enoxaparin (LOVENOX) injection  100 mg Subcutaneous BID   escitalopram  20 mg Oral Daily   Ipratropium-Albuterol  1 puff Inhalation Q6H   irbesartan  150 mg Oral Daily   levothyroxine  25 mcg Oral QAC breakfast   montelukast  10 mg Oral Daily   nirmatrelvir/ritonavir EUA  3 tablet Oral BID   polyethylene glycol  17 g Oral Daily   potassium chloride  40 mEq Oral BID   predniSONE  40 mg Oral Q breakfast   Followed by   Derrill Memo ON 06/22/2021] predniSONE  30 mg Oral Q breakfast   Followed by   Derrill Memo ON 06/23/2021] predniSONE  20 mg Oral Q breakfast   Followed by   Derrill Memo ON 06/24/2021] predniSONE  10 mg Oral Q breakfast   Continuous Infusions: Consultants:see note  Procedures:see note Antimicrobials: Anti-infectives (From admission, onward)    Start     Dose/Rate Route Frequency Ordered Stop   06/20/21 2200  nirmatrelvir/ritonavir EUA (renal dosing) (PAXLOVID) TBPK 2 tablet  Status:  Discontinued        2 tablet Oral 2 times daily 06/20/21 1914 06/20/21 2000   06/20/21 2200  nirmatrelvir/ritonavir EUA (PAXLOVID) TABS 3 tablet        3 tablet Oral 2 times daily 06/20/21 2001 06/25/21 2159      Culture/Microbiology    Component Value Date/Time   SDES  06/19/2021 1826    BLOOD LEFT WRIST Performed at [facility], [address], [place], [place] [address]    SPECREQUEST  06/19/2021 1826    BOTTLES DRAWN AEROBIC AND ANAEROBIC Blood Culture results may not be optimal due to an inadequate volume of blood received in culture bottles Performed at [facility], [address] [place], [place] [address]    CULT  06/19/2021 1826    NO GROWTH 2 DAYS Performed at [facility], [place] [address],  [place],  [address]    REPTSTATUS PENDING 06/19/2021 1826    Other culture-see note  Objective: Vitals: Today's Vitals   06/20/21 2312 06/21/21 0018 06/21/21 0719 06/21/21 1000  BP: 134/76 (!) 154/96 (!) 146/95 (!) 141/88  Pulse: 70 87 64 68  Resp: '16 18 16 16  '$ Temp: 97.7 F (36.5 C) 97.7 F (36.5 C) (!) 97.4 F (36.3 C) (!) 97.5 F (36.4 C)  TempSrc: Oral Oral Oral Oral  SpO2: 100% 98% 98% 95%  Weight:      Height:      PainSc:        Intake/Output Summary (Last 24 hours) at 06/21/2021 1018 Last data filed at 06/20/2021 2300 Gross per 24 hour  Intake 360 ml  Output --  Net 360 ml   Filed Weights   06/19/21 1647 06/20/21 1524  Weight: 102.5 kg 101.5 kg   Weight change: -0.998 kg  Intake/Output from previous day: 07/31 0701 - 08/01 0700 In: 568.8 [P.O.:360; I.V.:208.8] Out: -  Intake/Output this shift: No intake/output data recorded. Filed Weights   06/19/21 1647 06/20/21 1524  Weight: 102.5 kg 101.5 kg   Examination: General exam: AAO X3,weak appearing. HEENT:Oral mucosa moist, Ear/Nose WNL grossly,dentition normal. Respiratory system: bilaterally diminished, no added sounds, no use of accessory muscle, non tender. Cardiovascular system: S1 & S2 +, regular rate and rhythm , no JVD. Gastrointestinal system: Abdomen soft, NT,ND, BS+. Nervous System:Alert, awake, moving extremities Extremities: NO edema, distal peripheral pulses palpable.  Skin: No rashes,no icterus. MSK: Normal muscle bulk,tone, power Data Reviewed: I have personally reviewed following labs and imaging studies CBC: Recent Labs  Lab 06/19/21 1700 06/21/21 0452  WBC 5.3 7.4  NEUTROABS 2.3 5.8  HGB 9.3* 8.8*  HCT 30.8* 29.3*  MCV 75.3* 76.5*  PLT 333 [id number]   Basic Metabolic Panel: Recent Labs  Lab 06/19/21 1700 06/21/21 0452  NA 138 138  K 2.9* 4.1  CL 99 103  CO2 27 25  GLUCOSE 124* 184*  BUN 14 14  CREATININE 0.92 0.69  CALCIUM 8.2* 8.8*  MG  --  2.4  PHOS  --  3.7    GFR: Estimated Creatinine Clearance: 89.2 mL/min (by C-G formula based on SCr of 0.69 mg/dL). Liver Function Tests: Recent Labs  Lab 06/19/21 1700 06/21/21 0452  AST 29 17  ALT 19 16  ALKPHOS 63 57  BILITOT 0.5 0.6  PROT 7.4 6.9  ALBUMIN 3.5 3.2*   No results for input(s): LIPASE, AMYLASE in the last 168 hours. No results for input(s): AMMONIA in the last 168 hours. Coagulation Profile: Recent Labs  Lab 06/19/21 2140  INR 1.0   Cardiac Enzymes: No results for input(s): CKTOTAL, CKMB, CKMBINDEX, TROPONINI in the last 168 hours. BNP (last 3 results) No results for input(s): PROBNP in the last 8760 hours. HbA1C: No results for input(s): HGBA1C in the last 72 hours. CBG: No results for input(s): GLUCAP in the last 168 hours. Lipid Profile: Recent Labs    06/19/21 1700  TRIG 189*   Thyroid Function Tests: No results for input(s): TSH, T4TOTAL, FREET4, T3FREE, THYROIDAB in the last 72 hours. Anemia Panel: Recent Labs    06/19/21 1700 06/21/21 0452  FERRITIN 32 27  TIBC  --  426  IRON  --  21*   Sepsis Labs: Recent Labs  Lab 06/19/21 1700 06/19/21 1755 06/19/21 1927  PROCALCITON <0.10  --   --   LATICACIDVEN  --  1.5 1.2    Recent Results (from the past 240 hour(s))  Resp Panel by RT-PCR (Flu A&B, Covid) Nasopharyngeal Swab     Status: Abnormal   Collection Time: 06/19/21  5:55 PM   Specimen: Nasopharyngeal Swab; Nasopharyngeal(NP) swabs in vial transport medium  Result Value Ref Range Status   SARS Coronavirus 2 by RT PCR POSITIVE (A) NEGATIVE Final    Comment: RESULT CALLED TO, READ BACK BY AND VERIFIED WITH:  POWELL,V RN '@1927'$  06/19/21 EDENSCA (NOTE) SARS-CoV-2 target nucleic  acids are DETECTED.  The SARS-CoV-2 RNA is generally detectable in upper respiratory specimens during the acute phase of infection. Positive results are indicative of the presence of the identified virus, but do not rule out bacterial infection or co-infection with other  pathogens not detected by the test. Clinical correlation with patient history and other diagnostic information is necessary to determine patient infection status. The expected result is Negative.  Fact Sheet for Patients: EntrepreneurPulse.com.au  Fact Sheet for Healthcare Providers: IncredibleEmployment.be  This test is not yet approved or cleared by the [place] FDA and  has been authorized for detection and/or diagnosis of SARS-CoV-2 by FDA under an Emergency Use Authorization (EUA).  This EUA will remain in effect (meaning this test can b e used) for the duration of  the COVID-19 declaration under Section 564(b)(1) of the Act, 21 U.S.C. section 360bbb-3(b)(1), unless the authorization is terminated or revoked sooner.     Influenza A by PCR NEGATIVE NEGATIVE Final   Influenza B by PCR NEGATIVE NEGATIVE Final    Comment: (NOTE) The Xpert Xpress SARS-CoV-2/FLU/RSV plus assay is intended as an aid in the diagnosis of influenza from Nasopharyngeal swab specimens and should not be used as a sole basis for treatment. Nasal washings and aspirates are unacceptable for Xpert Xpress SARS-CoV-2/FLU/RSV testing.  Fact Sheet for Patients: EntrepreneurPulse.com.au  Fact Sheet for Healthcare Providers: IncredibleEmployment.be  This test is not yet approved or cleared by the [place] FDA and has been authorized for detection and/or diagnosis of SARS-CoV-2 by FDA under an Emergency Use Authorization (EUA). This EUA will remain in effect (meaning this test can be used) for the duration of the COVID-19 declaration under Section 564(b)(1) of the Act, 21 U.S.C. section 360bbb-3(b)(1), unless the authorization is terminated or revoked.  Performed at [facility], [address] [place], [place] [address]   Blood Culture (routine x 2)     Status: None (Preliminary result)   Collection Time: 06/19/21   6:22 PM   Specimen: Right Antecubital; Blood  Result Value Ref Range Status   Specimen Description   Final    RIGHT ANTECUBITAL BLOOD Performed at [facility], [address] [place], [place] [address]    Special Requests   Final    BOTTLES DRAWN AEROBIC AND ANAEROBIC Blood Culture adequate volume Performed at [facility], [address] [place], [place] [address]    Culture   Final    NO GROWTH 2 DAYS Performed at [facility], [place] [address] [place], [place] [address]    Report Status PENDING  Incomplete  Blood Culture (routine x 2)     Status: None (Preliminary result)   Collection Time: 06/19/21  6:26 PM   Specimen: BLOOD LEFT WRIST  Result Value Ref Range Status   Specimen Description   Final    BLOOD LEFT WRIST Performed at [facility], [place] [address] [place], [place] [address]    Special Requests   Final    BOTTLES DRAWN AEROBIC AND ANAEROBIC Blood Culture results may not be optimal due to an inadequate volume of blood received in culture bottles Performed at [facility], [address] [place], [place] [address]    Culture   Final    NO GROWTH 2 DAYS Performed at [facility], [place] [address] [place], [place] [address]    Report Status PENDING  Incomplete     Radiology Studies:  CT Angio Chest PE W and/or Wo Contrast  Result Date: 06/19/2021 CLINICAL DATA:  Shortness of breath beginning yesterday. Cough. Covid-19 viral infection. EXAM: CT ANGIOGRAPHY CHEST WITH CONTRAST TECHNIQUE: Multidetector CT imaging of the chest was performed using the standard protocol during bolus administration of intravenous contrast. Multiplanar CT image reconstructions and MIPs were obtained to evaluate the vascular anatomy. CONTRAST:  168m OMNIPAQUE IOHEXOL 350 MG/ML SOLN COMPARISON:  None. FINDINGS: Cardiovascular: Extensive bilateral pulmonary embolism is seen, with involvement of the right main pulmonary artery, and right upper,  middle, and lower lobar artery branches. Pulmonary embolism is also seen in segmental branches of the left upper lobe pulmonary artery, and peripheral left lower lobe branches in the left lung base. No CT evidence of right heart strain, with RV/LV ratio of 0.6 within normal limits. Mediastinum/Nodes: No masses or pathologically enlarged lymph nodes identified. Lungs/Pleura: Bilateral lower lobe atelectasis. No evidence of pulmonary mass or consolidation. No evidence of pleural effusion. Upper abdomen: No acute findings. Musculoskeletal: No suspicious bone lesions identified. Review of the MIP images confirms the above findings. IMPRESSION: Extensive bilateral pulmonary embolism. No CT evidence of right heart strain. Bilateral lower lobe atelectasis. Critical Value/emergent results were called by telephone at the time of interpretation on 06/19/2021 at 8:44 pm to provider Dr. BTamera Punt who verbally acknowledged these results. Electronically Signed   By: JMarlaine HindM.D.   On: 06/19/2021 20:46   DG Chest Port 1 View  Result Date: 06/19/2021 CLINICAL DATA:  COVID positive. Hypoxia. Cough and congestion. Shortness of breath. EXAM: PORTABLE CHEST 1 VIEW COMPARISON:  Radiograph 09/17/2009 FINDINGS: Lung volumes are low. Left basilar assessment is limited due to overlapping soft tissue structures. Borderline cardiomegaly. Mild peribronchial thickening. Patchy bibasilar opacities, with left lung base not well assessed. No pneumothorax. No large pleural effusion. No acute osseous abnormalities are seen. IMPRESSION: 1. Low lung volumes with patchy bibasilar opacities, atelectasis versus pneumonia. Left basilar assessment is suboptimal due to overlapping soft tissues. 2. Mild peribronchial thickening. Electronically Signed   By: MKeith RakeM.D.   On: 06/19/2021 19:49     LOS: 0 days   RAntonieta Pert MD [facility]  06/21/2021, 10:18 AM

## 2021-06-21 NOTE — Progress Notes (Signed)
Lower extremity venous bilateral study completed.  Preliminary results relayed to Mickel Baas, RN.  See CV Proc for preliminary results report.   Darlin Coco, RDMS, RVT

## 2021-06-22 ENCOUNTER — Other Ambulatory Visit (HOSPITAL_COMMUNITY): Payer: Self-pay

## 2021-06-22 DIAGNOSIS — I2699 Other pulmonary embolism without acute cor pulmonale: Secondary | ICD-10-CM | POA: Diagnosis not present

## 2021-06-22 LAB — CBC WITH DIFFERENTIAL/PLATELET
Abs Immature Granulocytes: 0.14 10*3/uL — ABNORMAL HIGH (ref 0.00–0.07)
Basophils Absolute: 0 10*3/uL (ref 0.0–0.1)
Basophils Relative: 0 %
Eosinophils Absolute: 0 10*3/uL (ref 0.0–0.5)
Eosinophils Relative: 0 %
HCT: 28.5 % — ABNORMAL LOW (ref 36.0–46.0)
Hemoglobin: 8.4 g/dL — ABNORMAL LOW (ref 12.0–15.0)
Immature Granulocytes: 2 %
Lymphocytes Relative: 12 %
Lymphs Abs: 1.2 10*3/uL (ref 0.7–4.0)
MCH: 22.6 pg — ABNORMAL LOW (ref 26.0–34.0)
MCHC: 29.5 g/dL — ABNORMAL LOW (ref 30.0–36.0)
MCV: 76.6 fL — ABNORMAL LOW (ref 80.0–100.0)
Monocytes Absolute: 0.7 10*3/uL (ref 0.1–1.0)
Monocytes Relative: 7 %
Neutro Abs: 7.5 10*3/uL (ref 1.7–7.7)
Neutrophils Relative %: 79 %
Platelets: 352 10*3/uL (ref 150–400)
RBC: 3.72 MIL/uL — ABNORMAL LOW (ref 3.87–5.11)
RDW: 17.8 % — ABNORMAL HIGH (ref 11.5–15.5)
WBC: 9.5 10*3/uL (ref 4.0–10.5)
nRBC: 0.3 % — ABNORMAL HIGH (ref 0.0–0.2)

## 2021-06-22 LAB — COMPREHENSIVE METABOLIC PANEL
ALT: 15 U/L (ref 0–44)
AST: 18 U/L (ref 15–41)
Albumin: 3.1 g/dL — ABNORMAL LOW (ref 3.5–5.0)
Alkaline Phosphatase: 52 U/L (ref 38–126)
Anion gap: 11 (ref 5–15)
BUN: 18 mg/dL (ref 6–20)
CO2: 25 mmol/L (ref 22–32)
Calcium: 8.8 mg/dL — ABNORMAL LOW (ref 8.9–10.3)
Chloride: 102 mmol/L (ref 98–111)
Creatinine, Ser: 0.79 mg/dL (ref 0.44–1.00)
GFR, Estimated: >60 mL/min (ref >60)
Glucose, Bld: 160 mg/dL — ABNORMAL HIGH (ref 70–99)
Potassium: 4 mmol/L (ref 3.5–5.1)
Sodium: 138 mmol/L (ref 135–145)
Total Bilirubin: 0.5 mg/dL (ref 0.3–1.2)
Total Protein: 6.5 g/dL (ref 6.5–8.1)

## 2021-06-22 LAB — PHOSPHORUS: Phosphorus: 3.2 mg/dL (ref 2.5–4.6)

## 2021-06-22 LAB — FERRITIN: Ferritin: 25 ng/mL (ref 11–307)

## 2021-06-22 LAB — MAGNESIUM: Magnesium: 2.3 mg/dL (ref 1.7–2.4)

## 2021-06-22 MED ORDER — PREDNISONE 20 MG PO TABS: 20.0000 mg | ORAL_TABLET | Freq: Every day | ORAL | 0 refills | Status: AC

## 2021-06-22 MED ORDER — ENOXAPARIN SODIUM 100 MG/ML IJ SOSY
100.0000 mg | PREFILLED_SYRINGE | Freq: Two times a day (BID) | INTRAMUSCULAR | 0 refills | Status: DC
  Filled 2021-06-22: qty 10, 5d supply, fill #0

## 2021-06-22 MED ORDER — APIXABAN 5 MG PO TABS: 5.0000 mg | ORAL_TABLET | Freq: Two times a day (BID) | ORAL | 0 refills | Status: DC

## 2021-06-22 MED ORDER — FERROUS SULFATE 325 (65 FE) MG PO TABS
325.0000 mg | ORAL_TABLET | Freq: Two times a day (BID) | ORAL | 0 refills | Status: DC
  Filled 2021-06-22: qty 60, 30d supply, fill #0

## 2021-06-22 MED ORDER — APIXABAN 5 MG PO TABS
5.0000 mg | ORAL_TABLET | Freq: Two times a day (BID) | ORAL | 0 refills | Status: DC
  Filled 2021-06-22: qty 60, 30d supply, fill #0

## 2021-06-22 NOTE — Care Management (Signed)
06-22-21 [id number] Case Manager submitted benefits check for home Lovenox. No further needs from Case Manager at this time.

## 2021-06-22 NOTE — TOC Benefit Eligibility Note (Signed)
Transition of Care [facility]) Benefit Eligibility Note    Patient Details  Name: Kara Cannon MRN: [id number] Date of Birth: 1971/10/04   Medication/Dose: Enoxaparin '100mg'$ .sq. twice a daily for 5 days  Covered?: Yes  Tier:  (tier 4)  Prescription Coverage [facility]erred Pharmacy: [facility]  Spoke with Person/Company/Phone Number:: Jaynie Crumble. W/ [employer] PH# [phone]  Co-Pay: $5.00  Prior Approval: No  Deductible: Unmet       Shelda Altes Phone Number: 06/22/2021, 10:06 AM

## 2021-06-22 NOTE — TOC Benefit Eligibility Note (Signed)
Patient Advocate Encounter  Prior Authorization for Eliquis 5 mg has been approved.    PA# [id number] Effective dates: 06/22/2021 through 06/22/2022  Patients co-pay is $25.00.     [name], [place] Patient Advocate Specialist [place] Antimicrobial Stewardship Team Direct Number: [phone]  Fax: [phone]

## 2021-06-22 NOTE — TOC Benefit Eligibility Note (Signed)
Patient Teacher, English as a foreign language completed.    The patient is currently admitted and upon discharge could be taking Lovenox (Enoxaparin) 100 mg/ml.  The current 5 day co-pay is, $5.00.   The patient is currently admitted and upon discharge could be taking Eliquis 5 mg.  Requires Prior Authorization.   The patient is insured through [place], [place] Patient Advocate Specialist [place] Team Direct Number: [phone]  Fax: [phone]

## 2021-06-22 NOTE — Discharge Summary (Signed)
Physician Discharge Summary  Kara Cannon [id number] DOB: July 08, 1971 DOA: 06/19/2021  PCP: Robyne Peers, MD  Admit date: 06/19/2021 Discharge date: 06/22/2021  Admitted From: home Disposition:  home  Recommendations for Outpatient Follow-up:  Follow up with PCP in 1 weeks  Home Health:no  Equipment/Devices: none  Discharge Condition: Stable Code Status:   Code Status: Full Code Diet recommendation:  Diet Order             Diet - low sodium heart healthy           Diet Heart Room service appropriate? Yes; Fluid consistency: Thin  Diet effective now                   Brief/Interim Summary: 50 year old female with history of recently diagnosed COVID, family history of hypercoagulability state, HTN, HLD, morbid obesity presented with cough, increasing shortness of breath.  She tested positive for COVID last Tuesday and for last 3 days prior to admission has been taking Paxlovid a week, steroid despite which continues to experience dry cough and developed increasing shortness of breath triggered by short walk. In the ED saturating in 82% on RA-needing 4 L nasal cannula CT positive for multifocal pulmonary embolism right main pulmonary artery right upper middle and lower lobe and segmental branch of the left upper lobe and perihilar left lower lobe and left lung base, no signs of right heart strain. Patient was placed on lovenox and admitted for further management.++ Pt crp came back some + 5.3> 3.6.  Was quickly able to be weaned off to room air. Echo normal EF no other significant finding.  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 Since hypoxia quickly resolved with inflammatory markers marginally elevated suspecting her hypoxia was mostly from PE rather than COVID, she had extensive bilateral PE, bilateral lower lobe atelectasis, no groundglass changes or pneumonia.  Discharge Diagnoses:  Acute hypoxic  respiratory failure COVID-19 infection-tested positive on 06/15/21- Multifocal PE: Hypoxia suspect due to PE mostly- although tested + for covid-CRP is elevated some , groundglass change in the lung. Continue on treatment of PE and also COVID- wean steroid, complete paxlovid- one moreday.COVID-19 Labs Recent Labs    06/19/21 1700 06/21/21 0452 06/22/21 0318  DDIMER 11.13* 4.61*  --   FERRITIN 32 27 25  LDH 193*  --   --   CRP 5.3* 3.6*  --    Multifocal pulmonary embolism/bilateral chronic appearing DVT: Keep on Lovenox for total 5 days then transition to Eliquis 5 mg twice daily as per pharmacy.  Unable to do Eliquis while she is on Paxlovid.  High sensitive troponin negative, echo no acute abnormalities.heart strain on the CT, high-sensitivity troponin were negative.    Microcytic anemia no recent labs in the last 7 years. Anemia work-up shows normal ferritin 27-likely from COVID, iron is still low at 21.  FOBT pending will need to have outpatient follow-up with PCP,Age-appropriate cancer screening.   Including colonoscopy. Added Iron supplementation.  CBC in a week from PCP Hypertension: Stable on irbesartan Hypothyroidism: Continue home Synthroid Morbid obesity BMI 43: Advise outpatient PCP follow-up, healthy lifestyle and weight loss  Consults: none  Subjective: Alert and oriented completely asymptomatic, no chest pain or shortness of breath, on room air.  Wants to go home today Discharge Exam: Vitals:   06/21/21 1437 06/21/21 2132  BP: (!) 149/75   Pulse: 73   Resp: 16 16  Temp: 98.1 F (36.7  C) (!) 97.3 F (36.3 C)  SpO2: 96%    General: Pt is alert, awake, not in acute distress Cardiovascular: RRR, S1/S2 +, no rubs, no gallops Respiratory: CTA bilaterally, no wheezing, no rhonchi Abdominal: Soft, NT, ND, bowel sounds + Extremities: no edema, no cyanosis  Discharge Instructions  Discharge Instructions     Diet - low sodium heart healthy   Complete by: As directed     Discharge instructions   Complete by: As directed    Please call call MD or return to ER for similar or worsening recurring problem that brought you to hospital or if any fever,nausea/vomiting,abdominal pain, uncontrolled pain, chest pain,  shortness of breath or any other alarming symptoms.  Please complete your paxlovid Tuesday then stop. (YOU should not take  Eliquis if on paxlovid)  Please follow-up your doctor as instructed in a week time and call the office for appointment.  Please avoid alcohol, smoking, or any other illicit substance and maintain healthy habits including taking your regular medications as prescribed.  You were cared for by a hospitalist during your hospital stay. If you have any questions about your discharge medications or the care you received while you were in the hospital after you are discharged, you can call the unit and ask to speak with the hospitalist on call if the hospitalist that took care of you is not available.  Once you are discharged, your primary care physician will handle any further medical issues. Please note that NO REFILLS for any discharge medications will be authorized once you are discharged, as it is imperative that you return to your primary care physician (or establish a relationship with a primary care physician if you do not have one) for your aftercare needs so that they can reassess your need for medications and monitor your lab values     Person Under Monitoring Name: Kara Cannon  Location: [place] [address]   Infection Prevention Recommendations for Individuals Confirmed to have, or Being Evaluated for, 2019 Novel Coronavirus (COVID-19) Infection Who Receive Care at Home  Individuals who are confirmed to have, or are being evaluated for, COVID-19 should follow the prevention steps below until a healthcare provider or local or state health department says they can return to normal  activities.  Stay home except to get medical care You should restrict activities outside your home, except for getting medical care. Do not go to work, school, or public areas, and do not use public transportation or taxis.  Call ahead before visiting your doctor Before your medical appointment, call the healthcare provider and tell them that you have, or are being evaluated for, COVID-19 infection. This will help the healthcare provider's office take steps to keep other people from getting infected. Ask your healthcare provider to call the local or state health department.  Monitor your symptoms Seek prompt medical attention if your illness is worsening (e.g., difficulty breathing). Before going to your medical appointment, call the healthcare provider and tell them that you have, or are being evaluated for, COVID-19 infection. Ask your healthcare provider to call the local or state health department.  Wear a facemask You should wear a facemask that covers your nose and mouth when you are in the same room with other people and when you visit a healthcare provider. People who live with or visit you should also wear a facemask while they are in the same room with you.  Separate yourself from other people in your home  As much as possible, you should stay in a different room from other people in your home. Also, you should use a separate bathroom, if available.  Avoid sharing household items. You should not share dishes, drinking glasses, cups, eating utensils, towels, bedding, or other items with other people in your home. After using these items, you should wash them thoroughly with soap and water.  Cover your coughs and sneezes Cover your mouth and nose with a tissue when you cough or sneeze, or you can cough or sneeze into your sleeve. Throw used tissues in a lined trash can, and immediately wash your hands with soap and water for at least 20 seconds or use an alcohol-based hand  rub.  Wash your [facility] your hands often and thoroughly with soap and water for at least 20 seconds. You can use an alcohol-based hand sanitizer if soap and water are not available and if your hands are not visibly dirty. Avoid touching your eyes, nose, and mouth with unwashed hands.  Prevention Steps for Caregivers and Household Members of Individuals Confirmed to have, or Being Evaluated for, COVID-19 Infection Being Cared for in the Home  If you live with, or provide care at home for, a person confirmed to have, or being evaluated for, COVID-19 infection please follow these guidelines to prevent infection:  Follow healthcare provider's instructions Make sure that you understand and can help the patient follow any healthcare provider instructions for all care.  Provide for the patient's basic needs You should help the patient with basic needs in the home and provide support for getting groceries, prescriptions, and other personal needs.  Monitor the patient's symptoms If they are getting sicker, call his or her medical provider and tell them that the patient has, or is being evaluated for, COVID-19 infection. This will help the healthcare provider's office take steps to keep other people from getting infected. Ask the healthcare provider to call the local or state health department.  Limit the number of people who have contact with the patient If possible, have only one caregiver for the patient. Other household members should stay in another home or place of residence. If this is not possible, they should stay in another room, or be separated from the patient as much as possible. Use a separate bathroom, if available. Restrict visitors who do not have an essential need to be in the home.  Keep older adults, very young children, and other sick people away from the patient Keep older adults, very young children, and those who have compromised immune systems or chronic health  conditions away from the patient. This includes people with chronic heart, lung, or kidney conditions, diabetes, and cancer.  Ensure good ventilation Make sure that shared spaces in the home have good air flow, such as from an air conditioner or an opened window, weather permitting.  Wash your hands often Wash your hands often and thoroughly with soap and water for at least 20 seconds. You can use an alcohol based hand sanitizer if soap and water are not available and if your hands are not visibly dirty. Avoid touching your eyes, nose, and mouth with unwashed hands. Use disposable paper towels to dry your hands. If not available, use dedicated cloth towels and replace them when they become wet.  Wear a facemask and gloves Wear a disposable facemask at all times in the room and gloves when you touch or have contact with the patient's blood, body fluids, and/or secretions or excretions, such as  sweat, saliva, sputum, nasal mucus, vomit, urine, or feces.  Ensure the mask fits over your nose and mouth tightly, and do not touch it during use. Throw out disposable facemasks and gloves after using them. Do not reuse. Wash your hands immediately after removing your facemask and gloves. If your personal clothing becomes contaminated, carefully remove clothing and launder. Wash your hands after handling contaminated clothing. Place all used disposable facemasks, gloves, and other waste in a lined container before disposing them with other household waste. Remove gloves and wash your hands immediately after handling these items.  Do not share dishes, glasses, or other household items with the patient Avoid sharing household items. You should not share dishes, drinking glasses, cups, eating utensils, towels, bedding, or other items with a patient who is confirmed to have, or being evaluated for, COVID-19 infection. After the person uses these items, you should wash them thoroughly with soap and  water.  Wash laundry thoroughly Immediately remove and wash clothes or bedding that have blood, body fluids, and/or secretions or excretions, such as sweat, saliva, sputum, nasal mucus, vomit, urine, or feces, on them. Wear gloves when handling laundry from the patient. Read and follow directions on labels of laundry or clothing items and detergent. In general, wash and dry with the warmest temperatures recommended on the label.  Clean all areas the individual has used often Clean all touchable surfaces, such as counters, tabletops, doorknobs, bathroom fixtures, toilets, phones, keyboards, tablets, and bedside tables, every day. Also, clean any surfaces that may have blood, body fluids, and/or secretions or excretions on them. Wear gloves when cleaning surfaces the patient has come in contact with. Use a diluted bleach solution (e.g., dilute bleach with 1 part bleach and 10 parts water) or a household disinfectant with a label that says EPA-registered for coronaviruses. To make a bleach solution at home, add 1 tablespoon of bleach to 1 quart (4 cups) of water. For a larger supply, add  cup of bleach to 1 gallon (16 cups) of water. Read labels of cleaning products and follow recommendations provided on product labels. Labels contain instructions for safe and effective use of the cleaning product including precautions you should take when applying the product, such as wearing gloves or eye protection and making sure you have good ventilation during use of the product. Remove gloves and wash hands immediately after cleaning.  Monitor yourself for signs and symptoms of illness Caregivers and household members are considered close contacts, should monitor their health, and will be asked to limit movement outside of the home to the extent possible. Follow the monitoring steps for close contacts listed on the symptom monitoring form.   ? If you have additional questions, contact your local health  department or call the epidemiologist on call at [phone] (available 24/7). ? This guidance is subject to change. For the most up-to-date guidance from [facility], please refer to their website: YouBlogs.pl   Increase activity slowly   Complete by: As directed       Allergies as of 06/22/2021       Reactions   Corn-containing Products    Ibuprofen    Naproxen Sodium    Penicillins    Has patient had a PCN reaction causing immediate rash, facial/tongue/throat swelling, SOB or lightheadedness with hypotension: No Has patient had a PCN reaction causing severe rash involving mucus membranes or skin necrosis: No Has patient had a PCN reaction that required hospitalization No Has patient had a PCN reaction occurring within the  last 10 years: No If all of the above answers are "NO", then may proceed with Cephalosporin use.   Sulfonamide Derivatives         Medication List     STOP taking these medications    famotidine 40 MG/5ML suspension Commonly known as: PEPCID       TAKE these medications    chlorzoxazone 500 MG tablet Commonly known as: PARAFON Take 500 mg by mouth 3 (three) times daily as needed for muscle spasms. Notes to patient: Muscle spasms   dextromethorphan-guaiFENesin 30-600 MG 12hr tablet Commonly known as: MUCINEX DM Take 1 tablet by mouth 2 (two) times daily as needed for cough. Notes to patient: Treats cough   Eliquis 5 MG Tabs tablet Generic drug: apixaban Take 1 tablet (5 mg total) by mouth 2 (two) times daily. START AFTER FINISHING LOVENOX (ENOXAPARIN) Start taking on: June 27, 2021 Notes to patient: Blood thinner Treats and prevents clotting in lungs and lower extremities   enoxaparin 100 MG/ML injection Commonly known as: LOVENOX Inject 1 syringe (100 mg total) into the skin 2 (two) times daily for 5 days. Notes to patient: Blood thinner  Treats and prevents clotting in lungs and  lower extremities Last dose will be 8/7 in the morning   EPINEPHrine 0.3 mg/0.3 mL Soaj injection Commonly known as: EPI-PEN Inject 0.3 mg into the muscle as needed for anaphylaxis.   escitalopram 20 MG tablet Commonly known as: LEXAPRO Take 20 mg by mouth daily. Notes to patient: Depression anxiety   FeroSul 325 (65 FE) MG tablet Generic drug: ferrous sulfate Take 1 tablet (325 mg total) by mouth 2 (two) times daily with a meal. Notes to patient: Iron supplement   HYDROcodone-homatropine 5-1.5 MG/5ML syrup Commonly known as: HYCODAN Take 5 mLs by mouth every 6 (six) hours as needed for cough.   ibuprofen 800 MG tablet Commonly known as: ADVIL Take 800 mg by mouth 3 (three) times daily as needed for moderate pain. Notes to patient: DO NOT TAKE CAN CAUSE STOMACH BLEEDING    levothyroxine 25 MCG tablet Commonly known as: SYNTHROID Take 25 mcg by mouth daily before breakfast. Notes to patient: Thyroid    montelukast 10 MG tablet Commonly known as: SINGULAIR Take 10 mg by mouth daily. Notes to patient: Allergies/Asthma   PAXLOVID PO Take 3 tablets by mouth in the morning and at bedtime. 300/100 MG Notes to patient: Antiviral to treat Covid 19 Last dose is on 8/3   pravastatin 20 MG tablet Commonly known as: PRAVACHOL Take 20 mg by mouth at bedtime. Notes to patient: Lowers cholesterol    predniSONE 20 MG tablet Commonly known as: DELTASONE Take 1 tablet (20 mg total) by mouth daily with breakfast for 2 days. Start taking on: June 23, 2021 What changed:  medication strength how much to take how to take this when to take this additional instructions Another medication with the same name was removed. Continue taking this medication, and follow the directions you see here. Notes to patient: Steroid to treat shortness of breath   pseudoephedrine 30 MG tablet Commonly known as: SUDAFED Take 30 mg by mouth every 4 (four) hours as needed for congestion.    telmisartan 40 MG tablet Commonly known as: MICARDIS Take 40 mg by mouth daily. Notes to patient: Lowers blood pressure       ASK your doctor about these medications    traMADol 50 MG tablet Commonly known as: ULTRAM Take 1 tablet (50 mg total) by mouth  every 6 (six) hours as needed.        Follow-up Information     Robyne Peers, MD Follow up in 2 day(s).   Specialty: Family Medicine Contact information: [id number] [place] [address] [phone]                Allergies  Allergen Reactions   Corn-Containing Products    Ibuprofen    Naproxen Sodium    Penicillins     Has patient had a PCN reaction causing immediate rash, facial/tongue/throat swelling, SOB or lightheadedness with hypotension: No Has patient had a PCN reaction causing severe rash involving mucus membranes or skin necrosis: No Has patient had a PCN reaction that required hospitalization No Has patient had a PCN reaction occurring within the last 10 years: No If all of the above answers are "NO", then may proceed with Cephalosporin use.    Sulfonamide Derivatives     The results of significant diagnostics from this hospitalization (including imaging, microbiology, ancillary and laboratory) are listed below for reference.    Microbiology: Recent Results (from the past 240 hour(s))  Resp Panel by RT-PCR (Flu A&B, Covid) Nasopharyngeal Swab     Status: Abnormal   Collection Time: 06/19/21  5:55 PM   Specimen: Nasopharyngeal Swab; Nasopharyngeal(NP) swabs in vial transport medium  Result Value Ref Range Status   SARS Coronavirus 2 by RT PCR POSITIVE (A) NEGATIVE Final    Comment: RESULT CALLED TO, READ BACK BY AND VERIFIED WITH:  POWELL,V RN '@[id number]'$  06/19/21 EDENSCA (NOTE) SARS-CoV-2 target nucleic acids are DETECTED.  The SARS-CoV-2 RNA is generally detectable in upper respiratory specimens during the acute phase of infection. Positive results are indicative of the  presence of the identified virus, but do not rule out bacterial infection or co-infection with other pathogens not detected by the test. Clinical correlation with patient history and other diagnostic information is necessary to determine patient infection status. The expected result is Negative.  Fact Sheet for Patients: EntrepreneurPulse.com.au  Fact Sheet for Healthcare Providers: IncredibleEmployment.be  This test is not yet approved or cleared by the [place] FDA and  has been authorized for detection and/or diagnosis of SARS-CoV-2 by FDA under an Emergency Use Authorization (EUA).  This EUA will remain in effect (meaning this test can b e used) for the duration of  the COVID-19 declaration under Section 564(b)(1) of the Act, 21 U.S.C. section 360bbb-3(b)(1), unless the authorization is terminated or revoked sooner.     Influenza A by PCR NEGATIVE NEGATIVE Final   Influenza B by PCR NEGATIVE NEGATIVE Final    Comment: (NOTE) The Xpert Xpress SARS-CoV-2/FLU/RSV plus assay is intended as an aid in the diagnosis of influenza from Nasopharyngeal swab specimens and should not be used as a sole basis for treatment. Nasal washings and aspirates are unacceptable for Xpert Xpress SARS-CoV-2/FLU/RSV testing.  Fact Sheet for Patients: EntrepreneurPulse.com.au  Fact Sheet for Healthcare Providers: IncredibleEmployment.be  This test is not yet approved or cleared by the [place] FDA and has been authorized for detection and/or diagnosis of SARS-CoV-2 by FDA under an Emergency Use Authorization (EUA). This EUA will remain in effect (meaning this test can be used) for the duration of the COVID-19 declaration under Section 564(b)(1) of the Act, 21 U.S.C. section 360bbb-3(b)(1), unless the authorization is terminated or revoked.  Performed at [facility], [address] [place], [place]  [address]   Blood Culture (routine x 2)  Status: None (Preliminary result)   Collection Time: 06/19/21  6:22 PM   Specimen: Right Antecubital; Blood  Result Value Ref Range Status   Specimen Description   Final    RIGHT ANTECUBITAL BLOOD Performed at [facility], [address] [place], [place] [address]    Special Requests   Final    BOTTLES DRAWN AEROBIC AND ANAEROBIC Blood Culture adequate volume Performed at [facility], [address] [place], [place] [address]    Culture   Final    NO GROWTH 3 DAYS Performed at [facility], [place] [address] [place], [place] [address]    Report Status PENDING  Incomplete  Blood Culture (routine x 2)     Status: None (Preliminary result)   Collection Time: 06/19/21  6:26 PM   Specimen: BLOOD LEFT WRIST  Result Value Ref Range Status   Specimen Description   Final    BLOOD LEFT WRIST Performed at [facility], [place] [address], [place], [place] [address]    Special Requests   Final    BOTTLES DRAWN AEROBIC AND ANAEROBIC Blood Culture results may not be optimal due to an inadequate volume of blood received in culture bottles Performed at [facility], [address] [place], [place] [address]    Culture   Final    NO GROWTH 3 DAYS Performed at [facility], [place] [address], [place], [place] [address]    Report Status PENDING  Incomplete    Procedures/Studies: CT Angio Chest PE W and/or Wo Contrast  Result Date: 06/19/2021 CLINICAL DATA:  Shortness of breath beginning yesterday. Cough. Covid-19 viral infection. EXAM: CT ANGIOGRAPHY CHEST WITH CONTRAST TECHNIQUE: Multidetector CT imaging of the chest was performed using the standard protocol during bolus administration of intravenous contrast. Multiplanar CT image reconstructions and MIPs were obtained to evaluate the vascular anatomy. CONTRAST:  152m OMNIPAQUE IOHEXOL 350 MG/ML SOLN COMPARISON:  None. FINDINGS: Cardiovascular: Extensive  bilateral pulmonary embolism is seen, with involvement of the right main pulmonary artery, and right upper, middle, and lower lobar artery branches. Pulmonary embolism is also seen in segmental branches of the left upper lobe pulmonary artery, and peripheral left lower lobe branches in the left lung base. No CT evidence of right heart strain, with RV/LV ratio of 0.6 within normal limits. Mediastinum/Nodes: No masses or pathologically enlarged lymph nodes identified. Lungs/Pleura: Bilateral lower lobe atelectasis. No evidence of pulmonary mass or consolidation. No evidence of pleural effusion. Upper abdomen: No acute findings. Musculoskeletal: No suspicious bone lesions identified. Review of the MIP images confirms the above findings. IMPRESSION: Extensive bilateral pulmonary embolism. No CT evidence of right heart strain. Bilateral lower lobe atelectasis. Critical Value/emergent results were called by telephone at the time of interpretation on 06/19/2021 at 8:44 pm to provider Dr. BTamera Punt who verbally acknowledged these results. Electronically Signed   By: JMarlaine HindM.D.   On: 06/19/2021 20:46   DG Chest Port 1 View  Result Date: 06/19/2021 CLINICAL DATA:  COVID positive. Hypoxia. Cough and congestion. Shortness of breath. EXAM: PORTABLE CHEST 1 VIEW COMPARISON:  Radiograph 09/17/2009 FINDINGS: Lung volumes are low. Left basilar assessment is limited due to overlapping soft tissue structures. Borderline cardiomegaly. Mild peribronchial thickening. Patchy bibasilar opacities, with left lung base not well assessed. No pneumothorax. No large pleural effusion. No acute osseous abnormalities are seen. IMPRESSION: 1. Low lung volumes with patchy bibasilar opacities, atelectasis versus pneumonia. Left basilar assessment is  suboptimal due to overlapping soft tissues. 2. Mild peribronchial thickening. Electronically Signed   By: Keith Rake M.D.   On: 06/19/2021 19:49   ECHOCARDIOGRAM COMPLETE  Result Date:  06/21/2021    ECHOCARDIOGRAM REPORT   Patient Name:   Kara Cannon Date of Exam: 06/21/2021 Medical Rec #:  [id number]         Height:       60.0 in Accession #:    [id number]        Weight:       223.8 lb Date of Birth:  May 05, 1971         BSA:          1.958 m Patient Age:    53 years          BP:           146/95 mmHg Patient Gender: F                 HR:           61 bpm. Exam Location:  Inpatient Procedure: 2D Echo, Cardiac Doppler and Color Doppler Indications:    Pulmonary Embolus I26.09  History:        Patient has no prior history of Echocardiogram examinations.                 Risk Factors:Hypertension and Dyslipidemia.  Sonographer:    Bernadene Person RDCS Referring Phys: [id number] [place]  1. Left ventricular ejection fraction, by estimation, is 60 to 65%. The left ventricle has normal function. The left ventricle has no regional wall motion abnormalities. There is mild left ventricular hypertrophy. Left ventricular diastolic parameters are indeterminate.  2. Right ventricular systolic function is normal. The right ventricular size is normal. Tricuspid regurgitation signal is inadequate for assessing PA pressure.  3. Left atrial size was mildly dilated.  4. The mitral valve is normal in structure. Mild mitral valve regurgitation. No evidence of mitral stenosis.  5. The aortic valve was not well visualized. Aortic valve regurgitation is not visualized. No aortic stenosis is present.  6. The inferior vena cava is dilated in size with <50% respiratory variability, suggesting right atrial pressure of 15 mmHg. FINDINGS  Left Ventricle: Left ventricular ejection fraction, by estimation, is 60 to 65%. The left ventricle has normal function. The left ventricle has no regional wall motion abnormalities. The left ventricular internal cavity size was normal in size. There is  mild left ventricular hypertrophy. Left ventricular diastolic parameters are indeterminate. Right Ventricle: The right  ventricular size is normal. No increase in right ventricular wall thickness. Right ventricular systolic function is normal. Tricuspid regurgitation signal is inadequate for assessing PA pressure. Left Atrium: Left atrial size was mildly dilated. Right Atrium: Right atrial size was normal in size. Pericardium: Trivial pericardial effusion is present. Mitral Valve: The mitral valve is normal in structure. Mild mitral valve regurgitation. No evidence of mitral valve stenosis. Tricuspid Valve: The tricuspid valve is normal in structure. Tricuspid valve regurgitation is not demonstrated. Aortic Valve: The aortic valve was not well visualized. Aortic valve regurgitation is not visualized. No aortic stenosis is present. Pulmonic Valve: The pulmonic valve was not well visualized. Pulmonic valve regurgitation is trivial. Aorta: The aortic root and ascending aorta are structurally normal, with no evidence of dilitation. Venous: The inferior vena cava is dilated in size with less than 50% respiratory variability, suggesting right atrial pressure of 15 mmHg. IAS/Shunts: The interatrial septum was not well visualized.  LEFT VENTRICLE PLAX 2D LVIDd:         4.50 cm  Diastology LVIDs:         3.00 cm  LV e' medial:    8.40 cm/s LV PW:         1.10 cm  LV E/e' medial:  10.5 LV IVS:        1.20 cm  LV e' lateral:   9.30 cm/s LVOT diam:     2.00 cm  LV E/e' lateral: 9.5 LV SV:         63 LV SV Index:   32 LVOT Area:     3.14 cm  RIGHT VENTRICLE RV S prime:     12.10 cm/s TAPSE (M-mode): 2.2 cm LEFT ATRIUM             Index       RIGHT ATRIUM           Index LA diam:        3.50 cm 1.79 cm/m  RA Area:     12.90 cm LA Vol (A2C):   73.7 ml 37.64 ml/m RA Volume:   26.30 ml  13.43 ml/m LA Vol (A4C):   71.8 ml 36.67 ml/m LA Biplane Vol: 74.9 ml 38.25 ml/m  AORTIC VALVE LVOT Vmax:   88.30 cm/s LVOT Vmean:  57.900 cm/s LVOT VTI:    0.199 m  AORTA Ao Root diam: 3.20 cm Ao Asc diam:  3.10 cm MITRAL VALVE MV Area (PHT): 3.17 cm     SHUNTS MV Decel Time: 239 msec    Systemic VTI:  0.20 m MV E velocity: 88.60 cm/s  Systemic Diam: 2.00 cm MV A velocity: 89.10 cm/s MV E/A ratio:  0.99 Oswaldo Milian MD Electronically signed by Oswaldo Milian MD Signature Date/Time: 06/21/2021/11:41:34 AM    Final    VAS [place] LOWER EXTREMITY VENOUS (DVT)  Result Date: 06/21/2021  Lower Venous DVT Study Patient Name:  Kara Cannon  Date of Exam:   06/21/2021 Medical Rec #: [id number]          Accession #:    [id number] Date of Birth: [birth year]          Patient Gender: F Patient Age:   2Y Exam Location:  [facility] Procedure:      VAS [place] LOWER EXTREMITY VENOUS (DVT) Referring Phys: [id number] Lequita Halt --------------------------------------------------------------------------------  Indications: Pulmonary embolism, Covid-19, elevated d-dimer.  Anticoagulation: Previously on Heparin drip, now on Lovenox. Comparison Study: No prior studies. Performing Technologist: Darlin Coco RDMS,RVT  Examination Guidelines: A complete evaluation includes B-mode imaging, spectral Doppler, color Doppler, and power Doppler as needed of all accessible portions of each vessel. Bilateral testing is considered an integral part of a complete examination. Limited examinations for reoccurring indications may be performed as noted. The reflux portion of the exam is performed with the patient in reverse Trendelenburg.  +---------+---------------+---------+-----------+---------------+--------------+ RIGHT    CompressibilityPhasicitySpontaneityProperties     Thrombus Aging +---------+---------------+---------+-----------+---------------+--------------+ CFV      Full           Yes      Yes                                      +---------+---------------+---------+-----------+---------------+--------------+ SFJ      Full                                                              +---------+---------------+---------+-----------+---------------+--------------+  FV Prox  Full                                                             +---------+---------------+---------+-----------+---------------+--------------+ FV Mid   Full                                                             +---------+---------------+---------+-----------+---------------+--------------+ FV DistalFull                                                             +---------+---------------+---------+-----------+---------------+--------------+ PFV      Full                                                             +---------+---------------+---------+-----------+---------------+--------------+ POP      Full           Yes      Yes                                      +---------+---------------+---------+-----------+---------------+--------------+ PTV      Full                                                             +---------+---------------+---------+-----------+---------------+--------------+ PERO     Partial        Yes      Yes        softly         Chronic                                                    echogenic                     +---------+---------------+---------+-----------+---------------+--------------+ Gastroc  Full                                                             +---------+---------------+---------+-----------+---------------+--------------+   +---------+---------------+---------+-----------+---------------+--------------+ LEFT     CompressibilityPhasicitySpontaneityProperties     Thrombus Aging +---------+---------------+---------+-----------+---------------+--------------+ CFV      Full           Yes  Yes                                      +---------+---------------+---------+-----------+---------------+--------------+ SFJ      Full                                                              +---------+---------------+---------+-----------+---------------+--------------+ FV Prox  Full                                                             +---------+---------------+---------+-----------+---------------+--------------+ FV Mid   Full                                                             +---------+---------------+---------+-----------+---------------+--------------+ FV DistalFull                                                             +---------+---------------+---------+-----------+---------------+--------------+ PFV      Full                                                             +---------+---------------+---------+-----------+---------------+--------------+ POP      Full           Yes      Yes                                      +---------+---------------+---------+-----------+---------------+--------------+ PTV      Full                                                             +---------+---------------+---------+-----------+---------------+--------------+ PERO     Full                                                             +---------+---------------+---------+-----------+---------------+--------------+ Gastroc  Partial        Yes      Yes        softly  Chronic                                                    echogenic                     +---------+---------------+---------+-----------+---------------+--------------+     Summary: RIGHT: - 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 - No cystic structure found in the popliteal fossa.  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 - No cystic structure found in the popliteal fossa.  *See table(s) above for measurements and observations. Electronically signed by Ruta Hinds MD on 06/21/2021 at 3:31:36 PM.    Final     Labs: BNP (last 3  results) Recent Labs    06/19/21 1700  BNP 123456   Basic Metabolic Panel: Recent Labs  Lab 06/19/21 1700 06/21/21 0452 06/22/21 0318  NA 138 138 138  K 2.9* 4.1 4.0  CL 99 103 102  CO2 '27 25 25  '$ GLUCOSE 124* 184* 160*  BUN '14 14 18  '$ CREATININE 0.92 0.69 0.79  CALCIUM 8.2* 8.8* 8.8*  MG  --  2.4 2.3  PHOS  --  3.7 3.2   Liver Function Tests: Recent Labs  Lab 06/19/21 1700 06/21/21 0452 06/22/21 0318  AST '29 17 18  '$ ALT '19 16 15  '$ ALKPHOS 63 57 52  BILITOT 0.5 0.6 0.5  PROT 7.4 6.9 6.5  ALBUMIN 3.5 3.2* 3.1*   No results for input(s): LIPASE, AMYLASE in the last 168 hours. No results for input(s): AMMONIA in the last 168 hours. CBC: Recent Labs  Lab 06/19/21 1700 06/21/21 0452 06/22/21 0318  WBC 5.3 7.4 9.5  NEUTROABS 2.3 5.8 7.5  HGB 9.3* 8.8* 8.4*  HCT 30.8* 29.3* 28.5*  MCV 75.3* 76.5* 76.6*  PLT 333 357 352   Cardiac Enzymes: No results for input(s): CKTOTAL, CKMB, CKMBINDEX, TROPONINI in the last 168 hours. BNP: Invalid input(s): POCBNP CBG: No results for input(s): GLUCAP in the last 168 hours. D-Dimer Recent Labs    06/19/21 1700 06/21/21 0452  DDIMER 11.13* 4.61*   Hgb A1c No results for input(s): HGBA1C in the last 72 hours. Lipid Profile Recent Labs    06/19/21 1700  TRIG 189*   Thyroid function studies No results for input(s): TSH, T4TOTAL, T3FREE, THYROIDAB in the last 72 hours.  Invalid input(s): FREET3 Anemia work up Recent Labs    06/21/21 0452 06/22/21 0318  FERRITIN 27 25  TIBC 426  --   IRON 21*  --    Urinalysis    Component Value Date/Time   COLORURINE YELLOW 12/17/2015 2258   APPEARANCEUR CLOUDY (A) 12/17/2015 2258   LABSPEC 1.037 (H) 12/17/2015 2258   PHURINE 5.0 12/17/2015 2258   GLUCOSEU NEGATIVE 12/17/2015 2258   GLUCOSEU NEGATIVE 06/22/2009 1050   HGBUR NEGATIVE 12/17/2015 2258   BILIRUBINUR NEGATIVE 12/17/2015 2258   BILIRUBINUR neg 09/22/2014 1930   KETONESUR NEGATIVE 12/17/2015 2258   PROTEINUR  NEGATIVE 12/17/2015 2258   UROBILINOGEN 0.2 09/22/2014 1930   UROBILINOGEN 0.2 06/22/2009 1050   NITRITE NEGATIVE 12/17/2015 2258   LEUKOCYTESUR NEGATIVE 12/17/2015 2258   Sepsis Labs Invalid input(s): PROCALCITONIN,  WBC,  LACTICIDVEN Microbiology Recent Results (from the past 240 hour(s))  Resp Panel by RT-PCR (Flu A&B, Covid) Nasopharyngeal Swab  Status: Abnormal   Collection Time: 06/19/21  5:55 PM   Specimen: Nasopharyngeal Swab; Nasopharyngeal(NP) swabs in vial transport medium  Result Value Ref Range Status   SARS Coronavirus 2 by RT PCR POSITIVE (A) NEGATIVE Final    Comment: RESULT CALLED TO, READ BACK BY AND VERIFIED WITH:  POWELL,V RN '@[id number]'$  06/19/21 EDENSCA (NOTE) SARS-CoV-2 target nucleic acids are DETECTED.  The SARS-CoV-2 RNA is generally detectable in upper respiratory specimens during the acute phase of infection. Positive results are indicative of the presence of the identified virus, but do not rule out bacterial infection or co-infection with other pathogens not detected by the test. Clinical correlation with patient history and other diagnostic information is necessary to determine patient infection status. The expected result is Negative.  Fact Sheet for Patients: EntrepreneurPulse.com.au  Fact Sheet for Healthcare Providers: IncredibleEmployment.be  This test is not yet approved or cleared by the [place] FDA and  has been authorized for detection and/or diagnosis of SARS-CoV-2 by FDA under an Emergency Use Authorization (EUA).  This EUA will remain in effect (meaning this test can b e used) for the duration of  the COVID-19 declaration under Section 564(b)(1) of the Act, 21 U.S.C. section 360bbb-3(b)(1), unless the authorization is terminated or revoked sooner.     Influenza A by PCR NEGATIVE NEGATIVE Final   Influenza B by PCR NEGATIVE NEGATIVE Final    Comment: (NOTE) The Xpert Xpress SARS-CoV-2/FLU/RSV  plus assay is intended as an aid in the diagnosis of influenza from Nasopharyngeal swab specimens and should not be used as a sole basis for treatment. Nasal washings and aspirates are unacceptable for Xpert Xpress SARS-CoV-2/FLU/RSV testing.  Fact Sheet for Patients: EntrepreneurPulse.com.au  Fact Sheet for Healthcare Providers: IncredibleEmployment.be  This test is not yet approved or cleared by the [place] FDA and has been authorized for detection and/or diagnosis of SARS-CoV-2 by FDA under an Emergency Use Authorization (EUA). This EUA will remain in effect (meaning this test can be used) for the duration of the COVID-19 declaration under Section 564(b)(1) of the Act, 21 U.S.C. section 360bbb-3(b)(1), unless the authorization is terminated or revoked.  Performed at [facility], [address] [place], [place] [address]   Blood Culture (routine x 2)     Status: None (Preliminary result)   Collection Time: 06/19/21  6:22 PM   Specimen: Right Antecubital; Blood  Result Value Ref Range Status   Specimen Description   Final    RIGHT ANTECUBITAL BLOOD Performed at [facility], [address] [place], [place] [address]    Special Requests   Final    BOTTLES DRAWN AEROBIC AND ANAEROBIC Blood Culture adequate volume Performed at [facility], [address] [place], [place] [address]    Culture   Final    NO GROWTH 3 DAYS Performed at [facility], [place] [address] [place], [place] [address]    Report Status PENDING  Incomplete  Blood Culture (routine x 2)     Status: None (Preliminary result)   Collection Time: 06/19/21  6:26 PM   Specimen: BLOOD LEFT WRIST  Result Value Ref Range Status   Specimen Description   Final    BLOOD LEFT WRIST Performed at [facility], [place] [address], [place], [place] [address]    Special Requests   Final    BOTTLES DRAWN AEROBIC AND ANAEROBIC Blood  Culture results may not be optimal due to an inadequate  volume of blood received in culture bottles Performed at [facility], [address] [place], [place] [address]    Culture   Final    NO GROWTH 3 DAYS Performed at [facility], [place] [address], [place], [place] [address]    Report Status PENDING  Incomplete     Time coordinating discharge: 25 minutes  SIGNED: Antonieta Pert, MD  [facility] 06/22/2021, 2:49 PM  If 7PM-7AM, please contact night-coverage [email or web address]

## 2021-06-22 NOTE — Discharge Instructions (Signed)

## 2021-06-22 NOTE — Progress Notes (Signed)
Patient educated on lovenox treatment and self administration. Patient able to return demonstrate procedure and was able to self administer lovenox dose with no difficulty.

## 2021-06-24 LAB — CULTURE, BLOOD (ROUTINE X 2)
Culture: NO GROWTH
Culture: NO GROWTH
Special Requests: ADEQUATE

## 2021-07-05 ENCOUNTER — Telehealth (HOSPITAL_COMMUNITY): Payer: Self-pay

## 2021-07-05 ENCOUNTER — Other Ambulatory Visit (HOSPITAL_COMMUNITY): Payer: Self-pay

## 2021-07-05 NOTE — Telephone Encounter (Signed)
Transitions of Care Pharmacy  ° °Call attempted for a pharmacy transitions of care follow-up. HIPAA appropriate voicemail was left with call back information provided.  ° °Call attempt #1. Will follow-up in 2-3 days.  °  °

## 2021-07-06 ENCOUNTER — Telehealth (HOSPITAL_COMMUNITY): Payer: Self-pay

## 2021-07-06 NOTE — Telephone Encounter (Signed)
Transitions of Care Pharmacy   Call attempted for a pharmacy transitions of care follow-up. HIPAA appropriate voicemail was left with call back information provided.   Call attempt #2. Will follow-up in 2-3 days.    

## 2021-07-07 ENCOUNTER — Telehealth (HOSPITAL_COMMUNITY): Payer: Self-pay

## 2021-07-07 NOTE — Telephone Encounter (Signed)
Transitions of Care Pharmacy   Call attempted for a pharmacy transitions of care follow-up. HIPAA appropriate voicemail was left with call back information provided.   Call attempt #3. Will no longer follow up for [facility] pharmacy.

## 2021-08-07 DIAGNOSIS — D509 Iron deficiency anemia, unspecified: Secondary | ICD-10-CM

## 2021-08-07 DIAGNOSIS — L858 Other specified epidermal thickening: Secondary | ICD-10-CM

## 2021-08-07 HISTORY — DX: Iron deficiency anemia, unspecified: D50.9

## 2021-08-07 HISTORY — DX: Other specified epidermal thickening: L85.8

## 2021-09-22 DIAGNOSIS — Z7189 Other specified counseling: Secondary | ICD-10-CM

## 2021-09-22 HISTORY — DX: Other specified counseling: Z71.89

## 2021-12-17 ENCOUNTER — Ambulatory Visit (INDEPENDENT_AMBULATORY_CARE_PROVIDER_SITE_OTHER): Payer: Managed Care, Other (non HMO)

## 2021-12-17 ENCOUNTER — Other Ambulatory Visit: Payer: Self-pay

## 2021-12-17 ENCOUNTER — Ambulatory Visit
Admission: EM | Admit: 2021-12-17 | Discharge: 2021-12-17 | Disposition: A | Discharge status: Home / Self Care | Payer: Managed Care, Other (non HMO) | Attending: Internal Medicine | Admitting: Internal Medicine

## 2021-12-17 DIAGNOSIS — S52602A Unspecified fracture of lower end of left ulna, initial encounter for closed fracture: Secondary | ICD-10-CM | POA: Diagnosis not present

## 2021-12-17 DIAGNOSIS — M25522 Pain in left elbow: Secondary | ICD-10-CM | POA: Diagnosis not present

## 2021-12-17 DIAGNOSIS — S52502A Unspecified fracture of the lower end of left radius, initial encounter for closed fracture: Secondary | ICD-10-CM | POA: Diagnosis not present

## 2021-12-17 MED ORDER — HYDROCODONE-ACETAMINOPHEN 5-325 MG PO TABS: 1.0000 | ORAL_TABLET | Freq: Four times a day (QID) | ORAL | 0 refills | Status: DC | PRN

## 2021-12-17 NOTE — Discharge Instructions (Signed)
Your wrist is broken.  You have been prescribed pain medication that may make you drowsy.  Please avoid driving while taking this medication.  Follow-up with provided contact of orthopedist for further evaluation and management.  Leave splint on until otherwise advised.  You may use ice application.

## 2021-12-17 NOTE — ED Triage Notes (Signed)
Pt c/o fall at home. States landed on left side. Her left wrist is edematous and painful.

## 2021-12-17 NOTE — ED Provider Notes (Addendum)
[facility]    CSN: [id number] Arrival date & time: 12/17/21  1911      History   Chief Complaint Chief Complaint  Patient presents with   left wrist injury    HPI Kara Cannon is a 51 y.o. female.   Patient presents with left upper extremity pain after a fall that occurred prior to arrival at approximately 3 PM today.  Patient reports that she slipped and fell landing with her hand outstretched.  She also reports that she hit the back of her head.  Most of her pain is localized to the left wrist but she is also having pain in the hand as well as in the left elbow.  Denies any numbness or tingling.  She does report that she has a headache but denies blurred vision, dizziness, nausea, vomiting.    Past Medical History:  Diagnosis Date   Allergy    CARDIAC MURMUR 06/22/2009   Mitral regurgitation   CHICKENPOX, HX OF 06/22/2009   Cough 09/17/2009   Effusion of ankle and foot joint 06/22/2009   EPISCLERITIS 06/22/2009   Headache(784.0) 06/22/2009   HEMORRHOIDS, INTERNAL 06/23/2003   Hyperlipidemia    HYPERTENSION 06/22/2009   IC (interstitial cystitis)    MIGRAINE HEADACHE 06/22/2009   PLANTAR WART, LEFT 08/03/2009   Premature ovarian failure    Unspecified vitamin D deficiency 06/22/2009   Urinary frequency 06/22/2009   UTI'S, HX OF 06/22/2009    Patient Active Problem List   Diagnosis Date Noted   Respiratory failure ([place]) 06/21/2021   Pulmonary emboli ([place]) 06/20/2021   Bilateral pulmonary embolism (HCC)    Acute respiratory failure with hypoxia (HCC)    Pulmonary embolism ([place]) 06/19/2021   Hypothyroidism 09/22/2014   Other abnormal glucose 04/05/2013   Hyperlipidemia 04/05/2013   Stress reaction 03/22/2013   Premature ovarian failure    IC (interstitial cystitis)    PLANTAR WART, LEFT 08/03/2009   UNSPECIFIED VITAMIN D DEFICIENCY 06/22/2009   MIGRAINE HEADACHE 06/22/2009   EPISCLERITIS 06/22/2009   HYPERTENSION 06/22/2009   INTERSTITIAL CYSTITIS 06/22/2009    EFFUSION OF ANKLE AND FOOT JOINT 06/22/2009   CARDIAC MURMUR 06/22/2009   UTI'S, HX OF 06/22/2009   CHICKENPOX, HX OF 06/22/2009   HEMORRHOIDS, INTERNAL 06/23/2003    Past Surgical History:  Procedure Laterality Date   CYSTOSCOPY  02/2009   Dr. Jeffie Pollock    OB History     Gravida  1   Para      Term      Preterm      AB  1   Living  0      SAB      IAB      Ectopic      Multiple      Live Births               Home Medications    Prior to Admission medications   Medication Sig Start Date End Date Taking? Authorizing Provider  HYDROcodone-acetaminophen (NORCO) 5-325 MG tablet Take 1 tablet by mouth every 6 (six) hours as needed for moderate pain. 12/17/21  Yes Daiden Coltrane, Michele Rockers, FNP  apixaban (ELIQUIS) 5 MG TABS tablet Take 1 tablet (5 mg total) by mouth 2 (two) times daily. START AFTER FINISHING LOVENOX (ENOXAPARIN) 06/27/21 07/27/21  Antonieta Pert, MD  chlorzoxazone (PARAFON) 500 MG tablet Take 500 mg by mouth 3 (three) times daily as needed for muscle spasms.    [provider]  dextromethorphan-guaiFENesin ([place] DM) 30-600 MG  12hr tablet Take 1 tablet by mouth 2 (two) times daily as needed for cough.    [provider]  enoxaparin (LOVENOX) 100 MG/ML injection Inject 1 syringe (100 mg total) into the skin 2 (two) times daily for 5 days. 06/22/21 06/27/21  Antonieta Pert, MD  EPINEPHrine 0.3 mg/0.3 mL IJ SOAJ injection Inject 0.3 mg into the muscle as needed for anaphylaxis. 06/11/21   [provider]  escitalopram (LEXAPRO) 20 MG tablet Take 20 mg by mouth daily. 01/03/21   [provider]  ferrous sulfate 325 (65 FE) MG tablet Take 1 tablet (325 mg total) by mouth 2 (two) times daily with a meal. 06/22/21 07/22/21  Antonieta Pert, MD  HYDROcodone-homatropine (HYCODAN) 5-1.5 MG/5ML syrup Take 5 mLs by mouth every 6 (six) hours as needed for cough.  12/14/15   [provider]  ibuprofen (ADVIL) 800 MG tablet Take 800 mg by mouth 3 (three)  times daily as needed for moderate pain. 02/04/21   [provider]  levothyroxine (SYNTHROID) 25 MCG tablet Take 25 mcg by mouth daily before breakfast.    [provider]  montelukast (SINGULAIR) 10 MG tablet Take 10 mg by mouth daily. 06/15/21   [provider]  Nirmatrelvir-Ritonavir (PAXLOVID PO) Take 3 tablets by mouth in the morning and at bedtime. 300/100 MG    [provider]  pravastatin (PRAVACHOL) 20 MG tablet Take 20 mg by mouth at bedtime.  11/04/15   [provider]  pseudoephedrine (SUDAFED) 30 MG tablet Take 30 mg by mouth every 4 (four) hours as needed for congestion.    [provider]  telmisartan (MICARDIS) 40 MG tablet Take 40 mg by mouth daily.    [provider]  dicyclomine (BENTYL) 20 MG tablet Take 1 tablet (20 mg total) by mouth every 6 (six) hours. Patient not taking: Reported on 12/17/2015 09/22/14 12/18/15  Ferman Hamming L, DO  hydrochlorothiazide (HYDRODIURIL) 12.5 MG tablet Take 1 tablet (12.5 mg total) by mouth daily. Patient not taking: Reported on 12/17/2015 04/18/14 12/18/15  Shawna Orleans, Doe-Hyun R, DO  ipratropium (ATROVENT) 0.03 % nasal spray Place 2 sprays into the nose 4 (four) times daily. Patient not taking: Reported on 12/17/2015 11/28/13 12/18/15  Shawnee Knapp, MD  potassium chloride (K-DUR) 10 MEQ tablet Take 1 tablet (10 mEq total) by mouth 2 (two) times daily. Patient not taking: Reported on 12/17/2015 05/15/13 12/18/15  Rosine Abe, DO    Family History Family History  Problem Relation Age of Onset   Skin cancer Maternal Uncle    Hypertension Mother    Hypertension Father    Ovarian cancer Paternal Grandmother    Cancer Paternal Grandmother    Diabetes Maternal Grandmother    Hyperlipidemia Maternal Grandmother     Social History Social History   Tobacco Use   Smoking status: Never   Smokeless tobacco: Never   Tobacco comments:    Married  Scientific laboratory technician Use: Never used   Substance Use Topics   Alcohol use: No   Drug use: No     Allergies   Corn-containing products, Ibuprofen, Naproxen sodium, Penicillins, and Sulfonamide derivatives   Review of Systems Review of Systems Per HPI  Physical Exam Triage Vital Signs ED Triage Vitals  Enc Vitals Group     BP 12/17/21 1952 (!) 187/111     Pulse Rate 12/17/21 1952 91     Resp 12/17/21 1952 18     Temp 12/17/21 1952 97.9 F (  36.6 C)     Temp Source 12/17/21 1952 Oral     SpO2 12/17/21 1952 97 %     Weight --      Height --      Head Circumference --      Peak Flow --      Pain Score 12/17/21 1953 0     Pain Loc --      Pain Edu? --      Excl. in [place]? --    No data found.  Updated Vital Signs BP (!) 187/111 (BP Location: Right Arm)    Pulse 91    Temp 97.9 F (36.6 C) (Oral)    Resp 18    SpO2 97%   Visual Acuity Right Eye Distance:   Left Eye Distance:   Bilateral Distance:    Right Eye Near:   Left Eye Near:    Bilateral Near:     Physical Exam Constitutional:      General: She is not in acute distress.    Appearance: Normal appearance. She is not toxic-appearing or diaphoretic.  HENT:     Head: Normocephalic and atraumatic.  Eyes:     Extraocular Movements: Extraocular movements intact.     Conjunctiva/sclera: Conjunctivae normal.  Cardiovascular:     Rate and Rhythm: Normal rate and regular rhythm.     Pulses: Normal pulses.     Heart sounds: Normal heart sounds.  Pulmonary:     Effort: Pulmonary effort is normal. No respiratory distress.     Breath sounds: Normal breath sounds.  Musculoskeletal:     Comments: Limited range of motion of left elbow due to pain but there is no obvious swelling, erythema, lacerations, abrasions noted.  Has moderate swelling noted to left wrist.  No erythema, bruising, lacerations, abrasions noted to left wrist.  It does appear angulated and slightly displaced.  Unable to move wrist due to pain.  Tenderness to palpation to left hand as well.   Grip strength weak given pain of wrist.  Neurovascular intact.  No open areas to skin.  Neurological:     General: No focal deficit present.     Mental Status: She is alert and oriented to person, place, and time. Mental status is at baseline.     Cranial Nerves: Cranial nerves 2-12 are intact.     Sensory: Sensation is intact.     Motor: Motor function is intact.     Coordination: Coordination is intact.     Gait: Gait is intact.  Psychiatric:        Mood and Affect: Mood normal.        Behavior: Behavior normal.        Thought Content: Thought content normal.        Judgment: Judgment normal.     UC Treatments / Results  Labs (all labs ordered are listed, but only abnormal results are displayed) Labs Reviewed - No data to display  EKG   Radiology DG Elbow Complete Left  Result Date: 12/17/2021 CLINICAL DATA:  Recent fall with left elbow pain, initial encounter EXAM: LEFT ELBOW - COMPLETE 2 VIEW COMPARISON:  None. FINDINGS: Degenerative changes are noted about left elbow joint. No acute fracture or dislocation is noted. Minimal joint effusion is seen. IMPRESSION: Degenerative change without acute bony abnormality. Electronically Signed   By: Inez Catalina M.D.   On: 12/17/2021 20:26   DG Wrist Complete Left  Result Date: 12/17/2021 CLINICAL DATA:  Recent fall with left wrist  pain, initial encounter EXAM: LEFT WRIST - COMPLETE 3+ VIEW COMPARISON:  None. FINDINGS: Comminuted fracture of the distal left radial metaphysis is noted with mild impaction and posterior angulation at the fracture site. Ulnar styloid fracture is noted with minimal displacement. Soft tissue swelling is seen. IMPRESSION: Distal radial and ulnar fractures with associated soft tissue swelling. Electronically Signed   By: Inez Catalina M.D.   On: 12/17/2021 20:24    Procedures Procedures (including critical care time)  Medications Ordered in UC Medications - No data to display  Initial Impression / Assessment  and Plan / UC Course  I have reviewed the triage vital signs and the nursing notes.  Pertinent labs & imaging results that were available during my care of the patient were reviewed by me and considered in my medical decision making (see chart for details).     Has pretty significant wrist fracture to left wrist that is comminuted, impacted, angulated.  Hand x-ray was not completed given patient's cooperation but low suspicion for fracture in the hand given physical exam.  Consulted on-call orthopedist due to severity of wrist fracture as well as patient being on Eliquis blood thinner. Dr. Grandville Silos with hand specialty would like to see patient in office on Monday for further evaluation and management.  He advised sugar-tong splint.  I splinted the patient to the best of my ability but I do not have adequate splinting supplies and that the lights when out in the facility, so the splint was completed in the dark.  It does appear to be stabilized.  Sent Norco to take as needed for pain given that she cannot take ibuprofen with Eliquis.  Dr. Grandville Silos also advised for her to follow-up with her healthcare provider that prescribes Eliquis to ask if she needs to stop it during acute injury.  Advised patient of this.  Patient was also advised to go to the hospital to have a CT scan performed given that she hit her head during the fall but patient declined to do this.  Risks associated with not going to hospital were discussed with patient.  Patient voiced understanding.  Provided patient with contact information for orthopedist to follow-up in the next few days. Patient also has elevated blood pressure reading but this appears attributed to pain.  Patient to monitor blood pressure at home.  Neuro exam is normal and no signs of endorgan damage.  Patient verbalized understanding and was agreeable with plan. Final Clinical Impressions(s) / UC Diagnoses   Final diagnoses:  Closed fracture of distal ends of left radius  and ulna, initial encounter     Discharge Instructions      Your wrist is broken.  You have been prescribed pain medication that may make you drowsy.  Please avoid driving while taking this medication.  Follow-up with provided contact of orthopedist for further evaluation and management.  Leave splint on until otherwise advised.  You may use ice application.     ED Prescriptions     Medication Sig Dispense Auth. Provider   HYDROcodone-acetaminophen (NORCO) 5-325 MG tablet Take 1 tablet by mouth every 6 (six) hours as needed for moderate pain. 30 tablet [place], Elsa E, [place]      I have reviewed the PDMP during this encounter.   Teodora Medici, [place] 12/18/21 [place], [place], FNP 12/18/21 [place], [place], [place] 12/18/21 [phone]

## 2021-12-18 ENCOUNTER — Telehealth: Payer: Self-pay | Admitting: Internal Medicine

## 2021-12-18 MED ORDER — HYDROCODONE-ACETAMINOPHEN 5-325 MG PO TABS: 1.0000 | ORAL_TABLET | Freq: Four times a day (QID) | ORAL | 0 refills | Status: DC | PRN

## 2021-12-18 NOTE — Telephone Encounter (Signed)
Patient called stating that the pharmacy that the [place] was originally sent to does not have [place] in stock.  Norco discontinued at previous pharmacy and resent to [facility] [address].  Patient notified and was agreeable with plan.

## 2021-12-24 ENCOUNTER — Emergency Department (HOSPITAL_BASED_OUTPATIENT_CLINIC_OR_DEPARTMENT_OTHER)
Admission: EM | Admit: 2021-12-24 | Discharge: 2021-12-25 | Disposition: A | Discharge status: Home / Self Care | Payer: Managed Care, Other (non HMO) | Attending: Emergency Medicine | Admitting: Emergency Medicine

## 2021-12-24 ENCOUNTER — Encounter (HOSPITAL_BASED_OUTPATIENT_CLINIC_OR_DEPARTMENT_OTHER): Payer: Self-pay | Admitting: *Deleted

## 2021-12-24 ENCOUNTER — Other Ambulatory Visit: Payer: Self-pay

## 2021-12-24 DIAGNOSIS — G8918 Other acute postprocedural pain: Secondary | ICD-10-CM | POA: Diagnosis not present

## 2021-12-24 DIAGNOSIS — M25532 Pain in left wrist: Secondary | ICD-10-CM | POA: Diagnosis present

## 2021-12-24 DIAGNOSIS — I10 Essential (primary) hypertension: Secondary | ICD-10-CM | POA: Insufficient documentation

## 2021-12-24 DIAGNOSIS — E039 Hypothyroidism, unspecified: Secondary | ICD-10-CM | POA: Diagnosis not present

## 2021-12-24 DIAGNOSIS — Z79899 Other long term (current) drug therapy: Secondary | ICD-10-CM | POA: Insufficient documentation

## 2021-12-24 DIAGNOSIS — Z7901 Long term (current) use of anticoagulants: Secondary | ICD-10-CM | POA: Diagnosis not present

## 2021-12-24 MED ORDER — HYDROCODONE-ACETAMINOPHEN 5-325 MG PO TABS
1.0000 | ORAL_TABLET | Freq: Once | ORAL | Status: AC
  Administered 2021-12-24: 1 via ORAL
  Filled 2021-12-24: qty 1

## 2021-12-24 MED ORDER — ONDANSETRON 4 MG PO TBDP
4.0000 mg | ORAL_TABLET | Freq: Once | ORAL | Status: AC
  Administered 2021-12-24: 4 mg via ORAL
  Filled 2021-12-24: qty 1

## 2021-12-24 MED ORDER — HYDROCODONE-ACETAMINOPHEN 5-325 MG PO TABS: 2.0000 | ORAL_TABLET | ORAL | 0 refills | Status: DC | PRN

## 2021-12-24 MED ORDER — ONDANSETRON 4 MG PO TBDP: 4.0000 mg | ORAL_TABLET | Freq: Three times a day (TID) | ORAL | 0 refills | Status: DC | PRN

## 2021-12-24 NOTE — Discharge Instructions (Addendum)
You were seen in the ER today for your arm pain.  Your splint was redressed.  You may use the prescribed pain medication as needed.  Follow-up with Dr. Grandville Silos as previously scheduled and return to the ER if you develop new numbness, tingling, discoloration in your hand, profuse bleeding from your wound, or any other new severe symptom.

## 2021-12-24 NOTE — ED Notes (Signed)
Rx x 2 given  Written and verbal inst to pt  Verbalized an understanding  To home with family 

## 2021-12-24 NOTE — ED Notes (Signed)
Pt. Had surgery on the L wrist today and has been unable to reach Dr. Grandville Silos.

## 2021-12-24 NOTE — ED Triage Notes (Signed)
Pt had left wrist surgery this am. Here with swelling and pain. She feels wetness against her skin. She takes eliquis and is afraid she is bleeding under the cast.

## 2021-12-24 NOTE — ED Provider Notes (Signed)
[facility] Provider Note   CSN: [id number] Arrival date & time: 12/24/21  1859     History  Chief Complaint  Patient presents with   Post-op Problem    Kara Cannon is a 51 y.o. female who underwent ORIF of comminuted distal left radial fracture this morning with Dr. Milly Jakob who presents with concern for worsening pain in the wrist, numbness and tingling in the fingers, and cold sensation to the fingers.  She is typically anticoagulated on Eliquis with history of PE, but has been off of it for a few days prior to surgery.  She endorses small amount of bleeding which she feels internal to the applied postoperative dressing as wet gauze.  I have personally read the patient medical records.  Additionally to history of PE she has history of hyperlipidemia, hemorrhoids, hypertension, migraines, and hypothyroidism.  She is anticoagulated on Eliquis.  HPI     Home Medications Prior to Admission medications   Medication Sig Start Date End Date Taking? Authorizing Provider  HYDROcodone-acetaminophen (NORCO/VICODIN) 5-325 MG tablet Take 2 tablets by mouth every 4 (four) hours as needed. 12/24/21  Yes Savana Spina R, PA-C  ondansetron (ZOFRAN-ODT) 4 MG disintegrating tablet Take 1 tablet (4 mg total) by mouth every 8 (eight) hours as needed for nausea or vomiting. 12/24/21  Yes Piotr Christopher, Gypsy Balsam, PA-C  apixaban (ELIQUIS) 5 MG TABS tablet Take 1 tablet (5 mg total) by mouth 2 (two) times daily. START AFTER FINISHING LOVENOX (ENOXAPARIN) 06/27/21 07/27/21  Antonieta Pert, MD  chlorzoxazone (PARAFON) 500 MG tablet Take 500 mg by mouth 3 (three) times daily as needed for muscle spasms.    [provider]  dextromethorphan-guaiFENesin ([place]IN[place] DM) 30-600 MG 12hr tablet Take 1 tablet by mouth 2 (two) times daily as needed for cough.    [provider]  enoxaparin (LOVENOX) 100 MG/ML injection Inject 1 syringe (100 mg total) into the skin 2  (two) times daily for 5 days. 06/22/21 06/27/21  Antonieta Pert, MD  EPINEPHrine 0.3 mg/0.3 mL IJ SOAJ injection Inject 0.3 mg into the muscle as needed for anaphylaxis. 06/11/21   [provider]  escitalopram (LEXAPRO) 20 MG tablet Take 20 mg by mouth daily. 01/03/21   [provider]  ferrous sulfate 325 (65 FE) MG tablet Take 1 tablet (325 mg total) by mouth 2 (two) times daily with a meal. 06/22/21 07/22/21  Antonieta Pert, MD  HYDROcodone-homatropine (HYCODAN) 5-1.5 MG/5ML syrup Take 5 mLs by mouth every 6 (six) hours as needed for cough.  12/14/15   [provider]  ibuprofen (ADVIL) 800 MG tablet Take 800 mg by mouth 3 (three) times daily as needed for moderate pain. 02/04/21   [provider]  levothyroxine (SYNTHROID) 25 MCG tablet Take 25 mcg by mouth daily before breakfast.    [provider]  montelukast (SINGULAIR) 10 MG tablet Take 10 mg by mouth daily. 06/15/21   [provider]  Nirmatrelvir-Ritonavir (PAXLOVID PO) Take 3 tablets by mouth in the morning and at bedtime. 300/100 MG    [provider]  pravastatin (PRAVACHOL) 20 MG tablet Take 20 mg by mouth at bedtime.  11/04/15   [provider]  pseudoephedrine (SUDAFED) 30 MG tablet Take 30 mg by mouth every 4 (four) hours as needed for congestion.    [provider]  telmisartan (MICARDIS) 40 MG tablet Take 40 mg by mouth daily.    [provider]  dicyclomine (BENTYL) 20 MG tablet  Take 1 tablet (20 mg total) by mouth every 6 (six) hours. Patient not taking: Reported on 12/17/2015 09/22/14 12/18/15  Ferman Hamming L, DO  hydrochlorothiazide (HYDRODIURIL) 12.5 MG tablet Take 1 tablet (12.5 mg total) by mouth daily. Patient not taking: Reported on 12/17/2015 04/18/14 12/18/15  Shawna Orleans, Doe-Hyun R, DO  ipratropium (ATROVENT) 0.03 % nasal spray Place 2 sprays into the nose 4 (four) times daily. Patient not taking: Reported on 12/17/2015 11/28/13 12/18/15  Shawnee Knapp, MD   potassium chloride (K-DUR) 10 MEQ tablet Take 1 tablet (10 mEq total) by mouth 2 (two) times daily. Patient not taking: Reported on 12/17/2015 05/15/13 12/18/15  Shawna Orleans, Doe-Hyun R, DO      Allergies    Corn-containing products, Ibuprofen, Naproxen sodium, Penicillins, and Sulfonamide derivatives    Review of Systems   Review of Systems  Musculoskeletal:        Left arm pain  Skin:  Positive for wound.  All other systems reviewed and are negative.  Physical Exam Updated Vital Signs BP (!) 165/105 (BP Location: Right Arm)    Pulse 82    Temp 97.8 F (36.6 C)    Resp 18    Ht 5\' 6"  (1.676 m)    Wt 104.8 kg    SpO2 100%    BMI 37.28 kg/m  Physical Exam Vitals and nursing note reviewed.  Constitutional:      Appearance: She is not ill-appearing or toxic-appearing.  HENT:     Head: Normocephalic and atraumatic.  Eyes:     General: No scleral icterus.       Right eye: No discharge.        Left eye: No discharge.     Conjunctiva/sclera: Conjunctivae normal.  Cardiovascular:     Heart sounds: Normal heart sounds.  Pulmonary:     Effort: Pulmonary effort is normal.  Abdominal:     Palpations: Abdomen is soft.  Musculoskeletal:       Arms:  Skin:    General: Skin is warm and dry.     Capillary Refill: Capillary refill takes less than 2 seconds.  Neurological:     General: No focal deficit present.     Mental Status: She is alert.  Psychiatric:        Mood and Affect: Mood normal.    ED Results / Procedures / Treatments   Labs (all labs ordered are listed, but only abnormal results are displayed) Labs Reviewed - No data to display  EKG None  Radiology No results found.  Procedures Procedures    Medications Ordered in ED Medications  HYDROcodone-acetaminophen (NORCO/VICODIN) 5-325 MG per tablet 1 tablet (1 tablet Oral Given 12/24/21 2223)  ondansetron (ZOFRAN-ODT) disintegrating tablet 4 mg (4 mg Oral Given 12/24/21 2223)    ED Course/ Medical Decision Making/ A&P                            Medical Decision Making 51 year old female who underwent ORIF of comminuted left distal radial fracture this morning with Dr. Grandville Silos who presents with concern for postoperative pain and possible bleeding.  Hypertensive and tachycardic on today though in significant pain.  Normal capillary refill and sensation in all 5 digits of the left hand.  Cold band on postoperative dressing was removed; it was under significant amount of tension.  Immediate improvement in patient's pain and resolution of the paresthesias in her fingers.  Postoperative dressing with sterile gauze was not removed  by this provider.  There is small amount of bleeding noted on gauze when viewing internal aspect of dressing where it gapes near the base of the thumb.  Amount and/or Complexity of Data Reviewed Discussion of management or test interpretation with external provider(s): Case discussed with on-call physician for [facility] orthopedics who states that if the gauze is not saturated with blood it should be allowed to stay in place.  Gauze is not saturated and patient's pain is improved after oral hydrocodone and root release of tension from [place] on dressing.  Dressing was rewrapped with Ace bandage and patient remained neurovascular intact in the hand following this repair.  Risk Prescription drug management.  No further [place] near this time.  Patient should follow-up with Dr. Grandville Silos as previously scheduled.  Hydrocodone prescribed to her pharmacy.  Hunter voiced understanding of her medical evaluation and treatment plan.  Each of her questions was answered to express satisfaction.  Return precautions were given.  Patient is well-appearing, stable, and was discharged in good condition.  This chart was dictated using voice recognition software, Dragon. Despite the best efforts of this provider to proofread and correct errors, errors may still occur which can change documentation  meaning.   Final Clinical Impression(s) / ED Diagnoses Final diagnoses:  Post-operative pain    Rx / DC Orders ED Discharge Orders          Ordered    HYDROcodone-acetaminophen (NORCO/VICODIN) 5-325 MG tablet  Every 4 hours PRN        12/24/21 2340    ondansetron (ZOFRAN-ODT) 4 MG disintegrating tablet  Every 8 hours PRN        12/24/21 2340              Mindee Robledo R, PA-C 12/25/21 0000    Lorelle Gibbs, DO 12/27/21 1542

## 2021-12-24 NOTE — ED Notes (Signed)
Pt. Has noted edema in the L hand with reports of pain 10/10

## 2022-01-13 DIAGNOSIS — M25532 Pain in left wrist: Secondary | ICD-10-CM

## 2022-01-13 HISTORY — DX: Pain in left wrist: M25.532

## 2022-03-29 DIAGNOSIS — G4733 Obstructive sleep apnea (adult) (pediatric): Secondary | ICD-10-CM | POA: Insufficient documentation

## 2022-03-29 HISTORY — DX: Obstructive sleep apnea (adult) (pediatric): G47.33

## 2022-04-28 DIAGNOSIS — D500 Iron deficiency anemia secondary to blood loss (chronic): Secondary | ICD-10-CM

## 2022-04-28 HISTORY — DX: Iron deficiency anemia secondary to blood loss (chronic): D50.0

## 2022-05-06 DIAGNOSIS — M65939 Unspecified synovitis and tenosynovitis, unspecified forearm: Secondary | ICD-10-CM

## 2022-05-06 DIAGNOSIS — M654 Radial styloid tenosynovitis [de Quervain]: Secondary | ICD-10-CM | POA: Insufficient documentation

## 2022-05-06 DIAGNOSIS — M659 Synovitis and tenosynovitis, unspecified: Secondary | ICD-10-CM | POA: Insufficient documentation

## 2022-05-06 HISTORY — DX: Unspecified synovitis and tenosynovitis, unspecified forearm: M65.939

## 2022-05-06 HISTORY — DX: Radial styloid tenosynovitis (de quervain): M65.4

## 2022-06-17 LAB — HM COLONOSCOPY

## 2022-08-12 DIAGNOSIS — R051 Acute cough: Secondary | ICD-10-CM | POA: Diagnosis not present

## 2022-08-12 DIAGNOSIS — Z20822 Contact with and (suspected) exposure to covid-19: Secondary | ICD-10-CM | POA: Diagnosis not present

## 2022-09-15 DIAGNOSIS — I2699 Other pulmonary embolism without acute cor pulmonale: Secondary | ICD-10-CM | POA: Diagnosis not present

## 2022-09-15 DIAGNOSIS — D509 Iron deficiency anemia, unspecified: Secondary | ICD-10-CM | POA: Diagnosis not present

## 2022-09-15 DIAGNOSIS — Z86718 Personal history of other venous thrombosis and embolism: Secondary | ICD-10-CM | POA: Diagnosis not present

## 2022-09-15 DIAGNOSIS — I2602 Saddle embolus of pulmonary artery with acute cor pulmonale: Secondary | ICD-10-CM | POA: Diagnosis not present

## 2022-09-15 DIAGNOSIS — Z7189 Other specified counseling: Secondary | ICD-10-CM | POA: Diagnosis not present

## 2022-09-23 DIAGNOSIS — E6609 Other obesity due to excess calories: Secondary | ICD-10-CM | POA: Diagnosis not present

## 2022-09-23 DIAGNOSIS — I2699 Other pulmonary embolism without acute cor pulmonale: Secondary | ICD-10-CM | POA: Diagnosis not present

## 2022-09-23 DIAGNOSIS — G4733 Obstructive sleep apnea (adult) (pediatric): Secondary | ICD-10-CM | POA: Diagnosis not present

## 2022-09-23 DIAGNOSIS — F411 Generalized anxiety disorder: Secondary | ICD-10-CM | POA: Diagnosis not present

## 2022-10-05 DIAGNOSIS — G4733 Obstructive sleep apnea (adult) (pediatric): Secondary | ICD-10-CM | POA: Diagnosis not present

## 2022-10-18 DIAGNOSIS — M654 Radial styloid tenosynovitis [de Quervain]: Secondary | ICD-10-CM | POA: Diagnosis not present

## 2023-01-30 ENCOUNTER — Ambulatory Visit
Admission: EM | Admit: 2023-01-30 | Discharge: 2023-01-30 | Disposition: A | Discharge status: Home / Self Care | Payer: BC Managed Care – PPO | Attending: Internal Medicine | Admitting: Internal Medicine

## 2023-01-30 DIAGNOSIS — L501 Idiopathic urticaria: Secondary | ICD-10-CM

## 2023-01-30 DIAGNOSIS — T7840XA Allergy, unspecified, initial encounter: Secondary | ICD-10-CM | POA: Diagnosis not present

## 2023-01-30 MED ORDER — TIZANIDINE HCL 4 MG PO TABS: 4.0000 mg | ORAL_TABLET | Freq: Four times a day (QID) | ORAL | 0 refills | Status: DC | PRN

## 2023-01-30 MED ORDER — DEXAMETHASONE SODIUM PHOSPHATE 10 MG/ML IJ SOLN
10.0000 mg | Freq: Once | INTRAMUSCULAR | Status: AC
  Administered 2023-01-30: 10 mg via INTRAMUSCULAR

## 2023-01-30 NOTE — ED Provider Notes (Signed)
[facility]    CSN: [id number] Arrival date & time: 01/30/23  0808      History   Chief Complaint Chief Complaint  Patient presents with  . possible allergic reaction    HPI Kara Cannon is a 51 y.o. female.     Idiopathic angio-edema urticaria since 19  2 years ago diagnosed it Always thought it was allergies and received allergy shots Makes her have muscle spasms Has taken tramadol for this in the past Once she feels one coming on she takes the tramadol immediately Usually has to get a shot  Friday started After eating [employer] pizza Started feeling "funny" after an hour Right side of her mouth started itching and swelling and the back started to spasm Took benadryl and this helped with the itching to the mouth Took 2 benadryl, 2 allergy pills,   This morning started having mouth itching and left sided facial swelling again Wrist and feet started welling No rash No fever/chills  Never happened after eating [employer] before No GI symptoms  Benadryl usually calms it down  Happens every couple of years, last episode was 2 years ago Takes allergy pill every night     Past Medical History:  Diagnosis Date  . Allergy   . CARDIAC MURMUR 06/22/2009   Mitral regurgitation  . CHICKENPOX, HX OF 06/22/2009  . Cough 09/17/2009  . Effusion of ankle and foot joint 06/22/2009  . EPISCLERITIS 06/22/2009  . Headache(784.0) 06/22/2009  . HEMORRHOIDS, INTERNAL 06/23/2003  . Hyperlipidemia   . HYPERTENSION 06/22/2009  . IC (interstitial cystitis)   . MIGRAINE HEADACHE 06/22/2009  . PLANTAR WART, LEFT 08/03/2009  . Premature ovarian failure   . Unspecified vitamin D deficiency 06/22/2009  . Urinary frequency 06/22/2009  . UTI'S, HX OF 06/22/2009    Patient Active Problem List   Diagnosis Date Noted  . Respiratory failure ([place]) 06/21/2021  . Pulmonary emboli ([place]) 06/20/2021  . Bilateral pulmonary embolism ([place])   . Acute respiratory failure with hypoxia ([place])   .  Pulmonary embolism ([place]) 06/19/2021  . Hypothyroidism 09/22/2014  . Other abnormal glucose 04/05/2013  . Hyperlipidemia 04/05/2013  . Stress reaction 03/22/2013  . Premature ovarian failure   . IC (interstitial cystitis)   . PLANTAR WART, LEFT 08/03/2009  . UNSPECIFIED VITAMIN D DEFICIENCY 06/22/2009  . MIGRAINE HEADACHE 06/22/2009  . EPISCLERITIS 06/22/2009  . HYPERTENSION 06/22/2009  . INTERSTITIAL CYSTITIS 06/22/2009  . EFFUSION OF ANKLE AND FOOT JOINT 06/22/2009  . CARDIAC MURMUR 06/22/2009  . UTI'S, HX OF 06/22/2009  . CHICKENPOX, HX OF 06/22/2009  . HEMORRHOIDS, INTERNAL 06/23/2003    Past Surgical History:  Procedure Laterality Date  . CYSTOSCOPY  02/2009   Dr. Jeffie Pollock    OB History     Gravida  1   Para      Term      Preterm      AB  1   Living  0      SAB      IAB      Ectopic      Multiple      Live Births               Home Medications    Prior to Admission medications   Medication Sig Start Date End Date Taking? Authorizing Provider  apixaban (ELIQUIS) 5 MG TABS tablet Take 1 tablet (5 mg total) by mouth 2 (two) times daily. START AFTER FINISHING LOVENOX (ENOXAPARIN) 06/27/21 07/27/21  Antonieta Pert,  MD  chlorzoxazone (PARAFON) 500 MG tablet Take 500 mg by mouth 3 (three) times daily as needed for muscle spasms.    [provider]  dextromethorphan-guaiFENesin (MUCINEX DM) 30-600 MG 12hr tablet Take 1 tablet by mouth 2 (two) times daily as needed for cough.    [provider]  enoxaparin (LOVENOX) 100 MG/ML injection Inject 1 syringe (100 mg total) into the skin 2 (two) times daily for 5 days. 06/22/21 06/27/21  Antonieta Pert, MD  EPINEPHrine 0.3 mg/0.3 mL IJ SOAJ injection Inject 0.3 mg into the muscle as needed for anaphylaxis. 06/11/21   [provider]  escitalopram (LEXAPRO) 20 MG tablet Take 20 mg by mouth daily. 01/03/21   [provider]  ferrous sulfate 325 (65 FE) MG tablet Take 1 tablet (325 mg total) by  mouth 2 (two) times daily with a meal. 06/22/21 07/22/21  Antonieta Pert, MD  HYDROcodone-acetaminophen (NORCO/VICODIN) 5-325 MG tablet Take 2 tablets by mouth every 4 (four) hours as needed. 12/24/21   Sponseller, Eugene Garnet R, PA-C  HYDROcodone-homatropine (HYCODAN) 5-1.5 MG/5ML syrup Take 5 mLs by mouth every 6 (six) hours as needed for cough.  12/14/15   [provider]  ibuprofen (ADVIL) 800 MG tablet Take 800 mg by mouth 3 (three) times daily as needed for moderate pain. 02/04/21   [provider]  levothyroxine (SYNTHROID) 25 MCG tablet Take 25 mcg by mouth daily before breakfast.    [provider]  montelukast (SINGULAIR) 10 MG tablet Take 10 mg by mouth daily. 06/15/21   [provider]  Nirmatrelvir-Ritonavir (PAXLOVID PO) Take 3 tablets by mouth in the morning and at bedtime. 300/100 MG    [provider]  ondansetron (ZOFRAN-ODT) 4 MG disintegrating tablet Take 1 tablet (4 mg total) by mouth every 8 (eight) hours as needed for nausea or vomiting. 12/24/21   Sponseller, Eugene Garnet R, PA-C  pravastatin (PRAVACHOL) 20 MG tablet Take 20 mg by mouth at bedtime.  11/04/15   [provider]  pseudoephedrine (SUDAFED) 30 MG tablet Take 30 mg by mouth every 4 (four) hours as needed for congestion.    [provider]  telmisartan (MICARDIS) 40 MG tablet Take 40 mg by mouth daily.    [provider]  dicyclomine (BENTYL) 20 MG tablet Take 1 tablet (20 mg total) by mouth every 6 (six) hours. Patient not taking: Reported on 12/17/2015 09/22/14 12/18/15  Ferman Hamming L, DO  hydrochlorothiazide (HYDRODIURIL) 12.5 MG tablet Take 1 tablet (12.5 mg total) by mouth daily. Patient not taking: Reported on 12/17/2015 04/18/14 12/18/15  Shawna Orleans, Doe-Hyun R, DO  ipratropium (ATROVENT) 0.03 % nasal spray Place 2 sprays into the nose 4 (four) times daily. Patient not taking: Reported on 12/17/2015 11/28/13 12/18/15  Shawnee Knapp, MD  potassium chloride (K-DUR) 10 MEQ  tablet Take 1 tablet (10 mEq total) by mouth 2 (two) times daily. Patient not taking: Reported on 12/17/2015 05/15/13 12/18/15  Rosine Abe, DO    Family History Family History  Problem Relation Age of Onset  . Skin cancer Maternal Uncle   . Hypertension Mother   . Hypertension Father   . Ovarian cancer Paternal Grandmother   . Cancer Paternal Grandmother   . Diabetes Maternal Grandmother   . Hyperlipidemia Maternal Grandmother     Social History Social History   Tobacco Use  . Smoking status: Never  . Smokeless tobacco: Never  . Tobacco comments:    Married  Media planner  . Vaping Use: Never  used  Substance Use Topics  . Alcohol use: No  . Drug use: No     Allergies   Corn-containing products, Ibuprofen, Naproxen sodium, Penicillins, and Sulfonamide derivatives   Review of Systems Review of Systems   Physical Exam Triage Vital Signs ED Triage Vitals  Enc Vitals Group     BP 01/30/23 0836 (!) 157/91     Pulse Rate 01/30/23 0833 76     Resp 01/30/23 0833 16     Temp 01/30/23 0833 98.3 F (36.8 C)     Temp Source 01/30/23 0833 Oral     SpO2 01/30/23 0833 98 %     Weight --      Height --      Head Circumference --      Peak Flow --      Pain Score 01/30/23 0833 3     Pain Loc --      Pain Edu? --      Excl. in [place]? --    No data found.  Updated Vital Signs BP (!) 157/91 (BP Location: Right Arm)   Pulse 76   Temp 98.3 F (36.8 C) (Oral)   Resp 16   SpO2 98%   Visual Acuity Right Eye Distance:   Left Eye Distance:   Bilateral Distance:    Right Eye Near:   Left Eye Near:    Bilateral Near:     Physical Exam   UC Treatments / Results  Labs (all labs ordered are listed, but only abnormal results are displayed) Labs Reviewed - No data to display  EKG   Radiology No results found.  Procedures Procedures (including critical care time)  Medications Ordered in UC Medications - No data to display  Initial Impression / Assessment and  Plan / UC Course  I have reviewed the triage vital signs and the nursing notes.  Pertinent labs & imaging results that were available during my care of the patient were reviewed by me and considered in my medical decision making (see chart for details).     *** Final Clinical Impressions(s) / UC Diagnoses   Final diagnoses:  None   Discharge Instructions   None    ED Prescriptions   None    PDMP not reviewed this encounter.

## 2023-01-30 NOTE — ED Triage Notes (Signed)
Pt c/o allergy exposure possibly to [employer] pizza onset ~ Friday. Stated it started with facial edema mostly on the right the has moved towards the left with associated swelling and itching in her mouth. Edema on tops of feet as well and states experiences muscle spasms on back when these events occur as well.

## 2023-01-30 NOTE — Discharge Instructions (Signed)
You were seen for your allergic reaction. I gave you a shot of steroid in the clinic. I would like for you to take Pepcid over-the-counter 20 mg once daily for the next 5 to 7 days to further suppress the allergic reaction. Continue taking Benadryl as needed.  You may take Robaxin muscle relaxer every 12 hours as needed for muscle spasm.  Do not drive, drink alcohol, or go to work when taking Robaxin as this can make you very sleepy.  Your work note is at the end of the packet.  If you develop any new or worsening symptoms or do not improve in the next 2 to 3 days, please return.  If your symptoms are severe, please go to the emergency room.  Follow-up with your primary care provider for further evaluation and management of your symptoms as well as ongoing wellness visits.  I hope you feel better!

## 2023-02-25 ENCOUNTER — Ambulatory Visit (HOSPITAL_BASED_OUTPATIENT_CLINIC_OR_DEPARTMENT_OTHER)
Admission: RE | Admit: 2023-02-25 | Discharge: 2023-02-25 | Disposition: A | Discharge status: Home / Self Care | Payer: BC Managed Care – PPO | Source: Ambulatory Visit | Attending: Internal Medicine | Admitting: Internal Medicine

## 2023-02-25 ENCOUNTER — Ambulatory Visit
Admission: EM | Admit: 2023-02-25 | Discharge: 2023-02-25 | Disposition: A | Discharge status: Home / Self Care | Payer: BC Managed Care – PPO

## 2023-02-25 DIAGNOSIS — Z1211 Encounter for screening for malignant neoplasm of colon: Secondary | ICD-10-CM | POA: Insufficient documentation

## 2023-02-25 DIAGNOSIS — M25511 Pain in right shoulder: Secondary | ICD-10-CM | POA: Diagnosis not present

## 2023-02-25 DIAGNOSIS — N950 Postmenopausal bleeding: Secondary | ICD-10-CM

## 2023-02-25 DIAGNOSIS — M79601 Pain in right arm: Secondary | ICD-10-CM

## 2023-02-25 DIAGNOSIS — K625 Hemorrhage of anus and rectum: Secondary | ICD-10-CM | POA: Insufficient documentation

## 2023-02-25 DIAGNOSIS — R918 Other nonspecific abnormal finding of lung field: Secondary | ICD-10-CM | POA: Diagnosis not present

## 2023-02-25 DIAGNOSIS — E119 Type 2 diabetes mellitus without complications: Secondary | ICD-10-CM | POA: Insufficient documentation

## 2023-02-25 DIAGNOSIS — R7989 Other specified abnormal findings of blood chemistry: Secondary | ICD-10-CM

## 2023-02-25 DIAGNOSIS — R1111 Vomiting without nausea: Secondary | ICD-10-CM | POA: Insufficient documentation

## 2023-02-25 DIAGNOSIS — R1032 Left lower quadrant pain: Secondary | ICD-10-CM

## 2023-02-25 DIAGNOSIS — F32A Depression, unspecified: Secondary | ICD-10-CM

## 2023-02-25 HISTORY — DX: Other specified abnormal findings of blood chemistry: R79.89

## 2023-02-25 HISTORY — DX: Depression, unspecified: F32.A

## 2023-02-25 HISTORY — DX: Morbid (severe) obesity due to excess calories: E66.01

## 2023-02-25 HISTORY — DX: Left lower quadrant pain: R10.32

## 2023-02-25 HISTORY — DX: Postmenopausal bleeding: N95.0

## 2023-02-25 MED ORDER — PREDNISOLONE 15 MG/5ML PO SOLN: 40.0000 mg | Freq: Every day | ORAL | 0 refills | Status: AC

## 2023-02-25 NOTE — ED Triage Notes (Signed)
Pt presents to [facility] with co of right sided arm swelling and pain with touch since Wednesday. Pt reports the pain started in her right elbow and has progressed into the shoulder. No injury noted. She is concerned for shingles since the pain worsens with touch but no rash present. She has taken motrin and muscle relaxer for the pain with minimal improvement.

## 2023-02-25 NOTE — Discharge Instructions (Signed)
I have prescribed you prednisone to help alleviate inflammation and discomfort.  Follow-up with your primary care doctor for further evaluation and management.  Go straight to the emergency department if swelling increases or if you develop redness, warmth, fever.

## 2023-02-25 NOTE — ED Provider Notes (Signed)
[facility]    CSN: [id number] Arrival date & time: 02/25/23  1046      History   Chief Complaint Chief Complaint  Patient presents with   Arm Pain    HPI Kara Cannon is a 52 y.o. female.   Patient presents with right arm pain that started about 4 days ago.  Patient reports pain is present on the right shoulder and extends down to the right elbow.  She reports that she has noticed some swelling on the inner part of her arm as well.  Denies fever, numbness, tingling, warmth.  Denies any injury to the area.  Reports that she did have shoulder surgery to repair rotator cuff about a year or so ago but is not sure if this is related.  Patient has taken Motrin and Tylenol with no improvement in symptoms.  Patient used to take Eliquis blood thinner for PE but no longer takes this.  This was also about a year ago.   Arm Pain    Past Medical History:  Diagnosis Date   Abnormal ultrasound 03/02/2017   Formatting of this note might be different from the original. 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   Headache(784.0) 06/22/2009   HEMORRHOIDS, INTERNAL 06/23/2003   Hyperlipidemia    HYPERTENSION  06/22/2009   Hypochromic microcytic anemia 08/07/2021   IC (interstitial cystitis)    Impingement syndrome of right shoulder region 07/13/2020   Iron deficiency anemia due to chronic blood loss 04/28/2022   Keratosis pilaris 08/07/2021   Left lower quadrant pain 02/25/2023   Left wrist pain 01/13/2022   MIGRAINE HEADACHE 06/22/2009   Morbid obesity 02/25/2023   Obesity due to excess calories with serious comorbidity 11/17/2016   OSA (obstructive sleep apnea) 03/29/2022   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   Pain in joint of right shoulder 11/07/2019   Perennial allergic rhinitis with seasonal variation 09/07/2019   PLANTAR WART, LEFT 08/03/2009   Postmenopausal bleeding 02/25/2023   Premature ovarian failure    Radial styloid tenosynovitis of left hand 05/06/2022   Raised TSH level 02/25/2023   Reactive airway disease without asthma 09/07/2019   Rectal bleeding 02/25/2023   Screening for malignant neoplasm of colon 02/25/2023   Tenosynovitis, wrist 05/06/2022   Type 2 diabetes mellitus with hyperglycemia 04/05/2019   Unspecified vitamin D deficiency 06/22/2009   Urinary frequency 06/22/2009   UTI'S, HX OF 06/22/2009   Vomiting without nausea 02/25/2023    Patient Active Problem List   Diagnosis Date Noted   Respiratory failure 06/21/2021   Pulmonary emboli 06/20/2021   Bilateral pulmonary embolism    Acute respiratory failure with hypoxia    Pulmonary embolism 06/19/2021   Premature ovarian failure 09/20/2016   IC (  interstitial cystitis) 09/20/2016   Other specified hypothyroidism 09/22/2014   Back spasm 07/05/2013   Other abnormal glucose 04/05/2013   Mixed hyperlipidemia 04/05/2013   Generalized anxiety disorder 03/22/2013   PLANTAR WART, LEFT 08/03/2009   Vitamin D deficiency 06/22/2009   Migraine without aura 06/22/2009   EPISCLERITIS 06/22/2009   Benign essential hypertension 06/22/2009   INTERSTITIAL CYSTITIS 06/22/2009   EFFUSION OF  ANKLE AND FOOT JOINT 06/22/2009   Cardiac murmur 06/22/2009   UTI'S, HX OF 06/22/2009   Personal history presenting hazards to health 06/22/2009   Hemorrhoids, internal 06/23/2003    Past Surgical History:  Procedure Laterality Date   CYSTOSCOPY  02/2009   Dr. Annabell Howells    OB History     Gravida  1   Para      Term      Preterm      AB  1   Living  0      SAB      IAB      Ectopic      Multiple      Live Births               Home Medications    Prior to Admission medications   Medication Sig Start Date End Date Taking? Authorizing Provider  adapalene (DIFFERIN) 0.1 % gel Apply 1 Application topically at bedtime. 12/24/15  Yes [provider]  albuterol (VENTOLIN HFA) 108 (90 Base) MCG/ACT inhaler Inhale into the lungs. 11/23/18  Yes [provider]  atorvastatin (LIPITOR) 40 MG tablet Take 1 tablet by mouth daily. 10/23/17  Yes [provider]  chlorpheniramine-HYDROcodone (TUSSIONEX) 10-8 MG/5ML Take 5mg  by mouth every 8 hrs or night time prn cough 08/30/22  Yes [provider]  cyclobenzaprine (FLEXERIL) 5 MG tablet Take by mouth. 04/14/17  Yes [provider]  diclofenac (VOLTAREN) 50 MG EC tablet Take 1 tablet twice a day by oral route. 10/18/22  Yes [provider]  diclofenac (VOLTAREN) 75 MG EC tablet Take 1 tablet by mouth 2 (two) times daily. 06/27/22  Yes [provider]  diphenhydrAMINE (BENADRYL) 25 mg capsule Take by mouth. 06/11/21  Yes [provider]  escitalopram (LEXAPRO) 20 MG tablet Take 1 tablet by mouth daily. 07/04/22  Yes [provider]  famotidine (PEPCID) 40 MG tablet Take by mouth. 08/07/21  Yes [provider]  fexofenadine (ALLEGRA) 60 MG tablet Take by mouth. 08/07/21  Yes [provider]  hydrochlorothiazide (MICROZIDE) 12.5 MG capsule Take 1 tablet by mouth daily. 11/06/17  Yes [provider]  liothyronine (CYTOMEL) 5 MCG tablet Take  by mouth. 04/05/17  Yes [provider]  nystatin-triamcinolone ointment (MYCOLOG) Apply topically. 07/04/22  Yes [provider]  ondansetron (ZOFRAN) 4 MG tablet Take by mouth. 03/29/22  Yes [provider]  prednisoLONE (PRELONE) 15 MG/5ML SOLN Take 13.3 mLs (40 mg total) by mouth daily before breakfast for 5 days. 02/25/23 03/02/23 Yes Lenzi Marmo, Acie Fredrickson, FNP  Salicylic Acid (SALVAX) 6 % FOAM 1 application to affected area Externally once a day to one thigh 12/24/15  Yes [provider]  Semaglutide, 1 MG/DOSE, (OZEMPIC, 1 MG/DOSE,) 4 MG/3ML SOPN Inject 1 mg into the skin once a week. 07/12/22  Yes [provider]  sucralfate (CARAFATE) 1 g tablet Take by mouth. 08/07/21  Yes [provider]  telmisartan (MICARDIS) 40 MG tablet Take 1 tablet by mouth daily. 11/03/22  Yes [provider]  traMADol (ULTRAM) 50 MG tablet  Take by mouth. 05/24/21  Yes [provider]  traZODone (DESYREL) 50 MG tablet TAKE 1 TABLET BY MOUTH NIGHTLY. TAKE ONE HOUR PRIOR TO BEDTIME 09/30/22  Yes [provider]  triamcinolone cream (KENALOG) 0.1 % APPLY TO AFFECTED AREA 2-3 TIMES DAILY 07/04/22  Yes [provider]  Vitamin D, Ergocalciferol, (DRISDOL) 1.25 MG (50000 UNIT) CAPS capsule (Prior Auth: Rx FVC#:[id number]) Oral for 84 08/05/16  Yes [provider]  apixaban (ELIQUIS) 5 MG TABS tablet Take 1 tablet (5 mg total) by mouth 2 (two) times daily. START AFTER FINISHING LOVENOX (ENOXAPARIN) 06/27/21 07/27/21  Lanae Boast, MD  apixaban (ELIQUIS) 5 MG TABS tablet Take 1 tablet by mouth 2 (two) times daily.    [provider]  Apple Cider Vinegar 500 MG TABS Take 1 tablet by mouth as needed.    [provider]  Aspirin-Acetaminophen-Caffeine (EXCEDRIN MIGRAINE PO) Excedrin Migraine    [provider]  aspirin-acetaminophen-caffeine (EXCEDRIN MIGRAINE) [phone] MG tablet Take by mouth.    [provider]   azithromycin (ZITHROMAX) 250 MG tablet azithromycin 250 mg tablet  TAKE TWO TABLETS ON THE FIRST DAY AND THEN ONE TABLET EVERY DAY AFTER.    [provider]  benzonatate (TESSALON) 100 MG capsule TAKE 2 CAPSULES BY MOUTH 3 TIMES A DAY AS NEEDED FOR COUGH FOR UP TO 7 DAYS    [provider]  cetirizine (ZYRTEC) 10 MG tablet Take by mouth.    [provider]  chlorhexidine (PERIDEX) 0.12 % solution (Prior Auth: Rx [id number]) Mouth/Throat for 16    [provider]  chlorzoxazone (PARAFON) 500 MG tablet Take by mouth.    [provider]  ciprofloxacin (CIPRO) 250 MG tablet (Prior Auth: Rx [id number]) Oral for 3    [provider]  clindamycin (CLEOCIN) 300 MG capsule (Prior Auth: Rx TSV#:[id number]) Oral for 7    [provider]  Coenzyme Q10 300 MG CAPS Take by mouth.    [provider]  dextromethorphan-guaiFENesin (MUCINEX DM) 30-600 MG 12hr tablet Take 1 tablet by mouth 2 (two) times daily as needed for cough.    [provider]  diazepam (VALIUM) 10 MG tablet diazepam 10 mg tablet  TAKE 1 TABLET BY MOUTH AS DIRECTED 1 HOUR PRIOR TO MRI REPEAT IF NEEDED    [provider]  diclofenac Sodium (PENNSAID) 2 % SOLN Pennsaid 20 mg/gram/actuation (2 %) topical soln in metered-dose pump  APPLY 2 PUMPS (40 MG) TO THE AFFECTED AREA BY TOPICAL ROUTE 2 TIMES PER DAY    [provider]  Diclofenac Sodium 1.5 % SOLN Please specify directions, refills and quantity    [provider]  doxycycline (VIBRA-TABS) 100 MG tablet TAKE 1 TABLET BY MOUTH 2 TIMES DAILY FOR 10 DAYS.    [provider]  enoxaparin (LOVENOX) 100 MG/ML injection Inject 1 syringe (100 mg total) into the skin 2 (two) times daily for 5 days. 06/22/21 06/27/21  Lanae Boast, MD  EPINEPHrine 0.3 mg/0.3 mL IJ SOAJ injection Inject 0.3 mg into the muscle as needed for anaphylaxis. 06/11/21   [provider]   Ergocalciferol (VITAMIN D2 PO) Vitamin D2 (obsolete)    [provider]  escitalopram (LEXAPRO) 10 MG tablet escitalopram 10 mg tablet  TAKE 1 TABLET BY MOUTH ONCE DAILY FOR 30 DAYS    [provider]  escitalopram (LEXAPRO) 20 MG tablet Take 20 mg by mouth daily. 01/03/21   [provider]  Evening Primrose Oil 1000 MG CAPS  Take 1 tablet by mouth every morning.    [provider]  ferrous sulfate 325 (65 FE) MG tablet Take 1 tablet (325 mg total) by mouth 2 (two) times daily with a meal. 06/22/21 07/22/21  Lanae BoastKc, Ramesh, MD  fexofenadine-pseudoephedrine (ALLEGRA-D) 60-120 MG 12 hr tablet Take 1 tablet by mouth 2 (two) times daily.    [provider]  Flaxseed, Linseed, (FLAX SEED OIL) 1000 MG CAPS Take by mouth.    [provider]  gabapentin (NEURONTIN) 100 MG capsule gabapentin 100 mg capsule   2 capsules every day by oral route.    [provider]  glucose blood test strip Contour Next Test Strips  USE 1 STRIP TO CHECK GLUCOSE ONCE DAILY    [provider]  HYDRALAZINE-HCTZ PO HCTZ    [provider]  hydrochlorothiazide (HYDRODIURIL) 12.5 MG tablet (Prior Auth: Rx VWU#:[id number]#:[id number]) Oral for 90    [provider]  HYDROcodone bit-homatropine (HYCODAN) 5-1.5 MG/5ML syrup TAKE 5 ML BY MOUTH EVERY 4 HOURS AS NEEDED FOR UP TO 5 DAYS    [provider]  HYDROcodone-acetaminophen (NORCO) 7.5-325 MG tablet (Schedule II Drug)  (Prior Auth: Rx AOZ#:[id number]#:[id number]) Oral for 4    [provider]  HYDROcodone-acetaminophen (NORCO/VICODIN) 5-325 MG tablet Take 2 tablets by mouth every 4 (four) hours as needed. 12/24/21   Sponseller, Eugene Gaviaebekah R, PA-C  HYDROcodone-acetaminophen (NORCO/VICODIN) 5-325 MG tablet hydrocodone 5 mg-acetaminophen 325 mg tablet  TAKE 1 TABLET(S) EVERY 6 HOURS AS NEEDED FOR PAIN.    [provider]  HYDROcodone-homatropine (HYCODAN) 5-1.5 MG/5ML syrup Take 5 mLs by mouth every 6  (six) hours as needed for cough.  12/14/15   [provider]  HYDROCODONE-HOMATROPINE PO (Schedule II Drug)  (Prior Auth: Rx [id number]#:[id number]) Oral for 6    [provider]  ibuprofen (ADVIL) 800 MG tablet Take 800 mg by mouth 3 (three) times daily as needed for moderate pain. 02/04/21   [provider]  ibuprofen (ADVIL) 800 MG tablet ibuprofen 800 mg tablet    [provider]  levothyroxine (SYNTHROID) 25 MCG tablet Take 1 tablet by mouth daily.    [provider]  LEVOTHYROXINE SODIUM PO Levothyroxine    [provider]  losartan-hydrochlorothiazide (HYZAAR) 50-12.5 MG tablet losartan 50 mg-hydrochlorothiazide 12.5 mg tablet  TAKE 1 TABLET BY MOUTH EVERY DAY    [provider]  MAGNESIUM-OXIDE PO Take by mouth.    [provider]  meloxicam (MOBIC) 15 MG tablet Take 1 tablet by mouth daily.    [provider]  methocarbamol (ROBAXIN) 500 MG tablet methocarbamol 500 mg tablet    [provider]  METHYLCOBALAMIN PO Take 1 tablet by mouth every morning.    [provider]  Microlet Lancets MISC Microlet Lancet  USE 1 TO CHECK GLUCOSE ONCE DAILY    [provider]  mirabegron ER (MYRBETRIQ) 50 MG TB24 tablet (Prior Auth: Rx XBM#:[id number]#:[id number]) Oral for 90    [provider]  montelukast (SINGULAIR) 10 MG tablet Take 10 mg by mouth daily. 06/15/21   [provider]  MULTIPLE VITAMINS PO Take 1 tablet by mouth as needed.    [provider]  Multiple Vitamins-Minerals (HAIR SKIN & NAILS ADVANCED PO) Hair, Skin & Nails    [provider]  MULTIPLE VITAMINS-MINERALS PO Take 1 tablet by mouth as needed.    [provider]  naproxen sodium (ANAPROX) 550 MG tablet naproxen sodium 550 mg tablet  Please specify directions, refills  and quantity    [provider]  Nirmatrelvir-Ritonavir (PAXLOVID PO) Take 3 tablets by mouth in the morning and at  bedtime. 300/100 MG    [provider]  nitrofurantoin, macrocrystal-monohydrate, (MACROBID) 100 MG capsule nitrofurantoin monohydrate/macrocrystals 100 mg capsule  TAKE 1 CAPSULE BY MOUTH TWICE A DAY FOR 7 DAYS    [provider]  ondansetron (ZOFRAN-ODT) 4 MG disintegrating tablet Take 1 tablet (4 mg total) by mouth every 8 (eight) hours as needed for nausea or vomiting. 12/24/21   Sponseller, Eugene Gavia, PA-C  oxyCODONE-acetaminophen (PERCOCET/ROXICET) 5-325 MG tablet (Schedule II Drug)  (Prior Auth: Rx [id number]) Oral for 3    [provider]  penicillin v potassium (VEETID) 500 MG tablet (Prior Auth: Rx MVE#:[id number]) Oral for 7    [provider]  phenazopyridine (PYRIDIUM) 200 MG tablet phenazopyridine 200 mg tablet  TAKE 1 TABLET BY MOUTH 3 TIMES DAILY AS NEEDED FOR UP TO 3 DAYS FOR PAIN.    [provider]  phentermine 15 MG capsule Take by mouth.    [provider]  POTASSIUM PO Potassium    [provider]  pravastatin (PRAVACHOL) 10 MG tablet pravastatin 10 mg tablet  TAKE 1 TABLET BY MOUTH EVERY DAY    [provider]  pravastatin (PRAVACHOL) 20 MG tablet Take 20 mg by mouth at bedtime.  11/04/15   [provider]  PRAVASTATIN SODIUM PO pravastatin    [provider]  PROBIOTIC PRODUCT PO Take by mouth.    [provider]  pseudoephedrine (SUDAFED) 30 MG tablet Take 30 mg by mouth every 4 (four) hours as needed for congestion.    [provider]  telmisartan (MICARDIS) 40 MG tablet Take 40 mg by mouth daily.    [provider]  telmisartan-hydrochlorothiazide (MICARDIS HCT) 40-12.5 MG tablet Take 1 tablet by mouth daily.    [provider]  tiZANidine (ZANAFLEX) 4 MG tablet Take 1 tablet (4 mg total) by mouth every 6 (six) hours as needed for muscle spasms. 01/30/23   Carlisle Beers, FNP  tobramycin-dexamethasone [facility]) ophthalmic solution  tobramycin 0.3 %-dexamethasone 0.1 % eye drops,suspension  INSTILL 1 DROP INTO RIGHT EYE 4 TIMES DAILY FOR 10 DAYS    [provider]  TRAMADOL HCL PO Tramadol    [provider]  TURMERIC PO Take 1 tablet by mouth as needed.    [provider]  UNABLE TO FIND Take by mouth. On Guard Plus    [provider]  UNABLE TO FIND Take by mouth. Blue Essentials Plus    [provider]  dicyclomine (BENTYL) 20 MG tablet Take 1 tablet (20 mg total) by mouth every 6 (six) hours. Patient not taking: Reported on 12/17/2015 09/22/14 12/18/15  Glennie Isle L, DO  ipratropium (ATROVENT) 0.03 % nasal spray Place 2 sprays into the nose 4 (four) times daily. Patient not taking: Reported on 12/17/2015 11/28/13 12/18/15  Sherren Mocha, MD  potassium chloride (K-DUR) 10 MEQ tablet Take 1 tablet (10 mEq total) by mouth 2 (two) times daily. Patient not taking: Reported on 12/17/2015 05/15/13 12/18/15  Meda Coffee, DO    Family History Family History  Problem Relation Age of Onset   Skin cancer Maternal Uncle    Hypertension Mother    Hypertension Father    Ovarian cancer Paternal Grandmother    Cancer Paternal Grandmother    Diabetes Maternal Grandmother    Hyperlipidemia Maternal Grandmother     Social History  Social History   Tobacco Use   Smoking status: Never   Smokeless tobacco: Never   Tobacco comments:    Married  Building services engineer Use: Never used  Substance Use Topics   Alcohol use: No   Drug use: No     Allergies   Corn-containing products, Naproxen sodium, Naproxen sodium, Sulfonamide derivatives, Sulfur, Ibuprofen, Glucose, and Penicillins   Review of Systems Review of Systems Per HPI  Physical Exam Triage Vital Signs ED Triage Vitals  Enc Vitals Group     BP 02/25/23 1130 133/84     Pulse Rate 02/25/23 1129 72     Resp 02/25/23 1129 19     Temp 02/25/23 1129 98 F (36.7 C)     Temp src --      SpO2 02/25/23 1129 98 %      Weight --      Height --      Head Circumference --      Peak Flow --      Pain Score 02/25/23 1126 3     Pain Loc --      Pain Edu? --      Excl. in GC? --    No data found.  Updated Vital Signs BP 133/84   Pulse 72   Temp 98 F (36.7 C)   Resp 19   SpO2 98%   Visual Acuity Right Eye Distance:   Left Eye Distance:   Bilateral Distance:    Right Eye Near:   Left Eye Near:    Bilateral Near:     Physical Exam Constitutional:      General: She is not in acute distress.    Appearance: Normal appearance. She is not toxic-appearing or diaphoretic.  HENT:     Head: Normocephalic and atraumatic.  Eyes:     Extraocular Movements: Extraocular movements intact.     Conjunctiva/sclera: Conjunctivae normal.  Pulmonary:     Effort: Pulmonary effort is normal.  Musculoskeletal:     Comments: Patient has tenderness to palpation to lateral right shoulder.  Patient also has tenderness throughout upper arm and anterior upper arm directly above anterior elbow.  Very minimal swelling noted in this area.  There is no warmth or discoloration.  Patient has full range of motion of upper extremity.  Grip strength is 5/5.  Capillary refill and pulses intact.  Neurovascularly intact.  Neurological:     General: No focal deficit present.     Mental Status: She is alert and oriented to person, place, and time. Mental status is at baseline.  Psychiatric:        Mood and Affect: Mood normal.        Behavior: Behavior normal.        Thought Content: Thought content normal.        Judgment: Judgment normal.      UC Treatments / Results  Labs (all labs ordered are listed, but only abnormal results are displayed) Labs Reviewed - No data to display  EKG   Radiology No results found.  Procedures Procedures (including critical care time)  Medications Ordered in UC Medications - No data to display  Initial Impression / Assessment and Plan / UC Course  I have reviewed the triage vital  signs and the nursing notes.  Pertinent labs & imaging results that were available during my care of the patient were reviewed by me and considered in my medical decision making (see chart for details).  I am not sure exact etiology of patient's arm discomfort.  Discussed with patient the concern for DVT given history of blood clots and having to take blood thinning medications.  Advised patient that I do not have ultrasound capabilities here in urgent care and that ER evaluation would be a good idea and reasonable.  She declined going to the ER.  Risks associated with not going to ER were discussed with patient.  Patient voiced understanding and accepted risks.  I do have low suspicion for DVT given physical exam.  I do suspect some type of inflammation and given physical exam it is most likely musculoskeletal in nature.  Will obtain x-ray of right shoulder.  Do not have x-ray capabilities here in urgent care today so outpatient imaging was ordered at [facility].  Awaiting results.  I do think patient would benefit from steroid therapy given that NSAIDs and Tylenol have not been helpful.  Patient has taken steroids before and tolerated well.  No obvious contraindication to steroid noted in patient's history.  Advised following up with primary care doctor for further evaluation and management.  Also advised strict ER precautions if any symptoms persist or worsen.  Patient verbalized understanding and was agreeable with plan.  Called pharmacy given that epic was giving a warning that prednisone could contain part of patient's allergies.  They advised that liquid prednisolone would be safest option and does not contain these ingredients so this was prescribed. Final Clinical Impressions(s) / UC Diagnoses   Final diagnoses:  Right arm pain     Discharge Instructions      I have prescribed you prednisone to help alleviate inflammation and discomfort.  Follow-up with your primary care  doctor for further evaluation and management.  Go straight to the emergency department if swelling increases or if you develop redness, warmth, fever.    ED Prescriptions     Medication Sig Dispense Auth. Provider   prednisoLONE (PRELONE) 15 MG/5ML SOLN Take 13.3 mLs (40 mg total) by mouth daily before breakfast for 5 days. 66.5 mL Gustavus Bryant, [place]      PDMP not reviewed this encounter.   Gustavus Bryant, [place] 02/25/23 1500

## 2023-02-26 ENCOUNTER — Ambulatory Visit (INDEPENDENT_AMBULATORY_CARE_PROVIDER_SITE_OTHER): Payer: BC Managed Care – PPO

## 2023-02-26 ENCOUNTER — Other Ambulatory Visit: Payer: Self-pay

## 2023-02-26 ENCOUNTER — Ambulatory Visit
Admission: RE | Admit: 2023-02-26 | Discharge: 2023-02-26 | Disposition: A | Discharge status: Home / Self Care | Payer: BC Managed Care – PPO | Source: Ambulatory Visit | Attending: Internal Medicine | Admitting: Internal Medicine

## 2023-02-26 VITALS — BP 134/74 | HR 92 | Temp 98.3°F | Resp 18

## 2023-02-26 DIAGNOSIS — Z09 Encounter for follow-up examination after completed treatment for conditions other than malignant neoplasm: Secondary | ICD-10-CM | POA: Diagnosis not present

## 2023-02-26 DIAGNOSIS — Z0389 Encounter for observation for other suspected diseases and conditions ruled out: Secondary | ICD-10-CM

## 2023-02-26 DIAGNOSIS — R918 Other nonspecific abnormal finding of lung field: Secondary | ICD-10-CM | POA: Diagnosis not present

## 2023-02-26 NOTE — ED Triage Notes (Signed)
Pt here for follow up chest x ray  

## 2023-02-26 NOTE — Discharge Instructions (Signed)
X-ray was normal.  Follow-up with PCP for any further concerns.

## 2023-02-26 NOTE — ED Provider Notes (Signed)
[facility]    CSN: [id number] Arrival date & time: 02/26/23  1046      History   Chief Complaint Chief Complaint  Patient presents with   Shoulder Pain    xray - Entered by patient   Follow-up    HPI Kara Cannon is a 52 y.o. female.   Patient is here today to have follow-up chest x-ray.  She presented yesterday for right shoulder pain and an outpatient x-ray was ordered.  Radiology noticed a possible nodular opacity that was concerning for pulmonary nodule.  They recommended 2 view chest x-ray.  Patient denies any previous history of this or any associated respiratory symptoms.   Shoulder Pain   Past Medical History:  Diagnosis Date   Abnormal ultrasound 03/02/2017   Formatting of this note might be different from the original. 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   Headache(784.0) 06/22/2009   HEMORRHOIDS, INTERNAL 06/23/2003   Hyperlipidemia    HYPERTENSION 06/22/2009   Hypochromic microcytic anemia 08/07/2021   IC (interstitial cystitis)    Impingement syndrome of right shoulder region 07/13/2020   Iron deficiency anemia due to chronic blood loss 04/28/2022   Keratosis pilaris  08/07/2021   Left lower quadrant pain 02/25/2023   Left wrist pain 01/13/2022   MIGRAINE HEADACHE 06/22/2009   Morbid obesity 02/25/2023   Obesity due to excess calories with serious comorbidity 11/17/2016   OSA (obstructive sleep apnea) 03/29/2022   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   Pain in joint of right shoulder 11/07/2019   Perennial allergic rhinitis with seasonal variation 09/07/2019   PLANTAR WART, LEFT 08/03/2009   Postmenopausal bleeding 02/25/2023   Premature ovarian failure    Radial styloid tenosynovitis of left hand 05/06/2022   Raised TSH level 02/25/2023   Reactive airway disease without asthma 09/07/2019   Rectal bleeding 02/25/2023   Screening for malignant neoplasm of colon 02/25/2023   Tenosynovitis, wrist 05/06/2022   Type 2 diabetes mellitus with hyperglycemia 04/05/2019   Unspecified vitamin D deficiency 06/22/2009   Urinary frequency 06/22/2009   UTI'S, HX OF 06/22/2009   Vomiting without nausea 02/25/2023    Patient Active Problem List   Diagnosis Date Noted   Respiratory failure 06/21/2021   Pulmonary emboli 06/20/2021   Bilateral pulmonary embolism    Acute respiratory failure with hypoxia    Pulmonary embolism 06/19/2021   Premature ovarian failure 09/20/2016   IC (interstitial cystitis) 09/20/2016   Other specified hypothyroidism 09/22/2014   Back spasm 07/05/2013   Other abnormal glucose 04/05/2013   Mixed hyperlipidemia 04/05/2013   Generalized anxiety disorder 03/22/2013   PLANTAR WART, LEFT 08/03/2009   Vitamin D deficiency 06/22/2009   Migraine without aura 06/22/2009   EPISCLERITIS 06/22/2009  Benign essential hypertension 06/22/2009   INTERSTITIAL CYSTITIS 06/22/2009   EFFUSION OF ANKLE AND FOOT JOINT 06/22/2009   Cardiac murmur 06/22/2009   UTI'S, HX OF 06/22/2009   Personal history presenting hazards to health 06/22/2009   Hemorrhoids, internal 06/23/2003    Past Surgical History:  Procedure  Laterality Date   CYSTOSCOPY  02/2009   Dr. Annabell Howells    OB History     Gravida  1   Para      Term      Preterm      AB  1   Living  0      SAB      IAB      Ectopic      Multiple      Live Births               Home Medications    Prior to Admission medications   Medication Sig Start Date End Date Taking? Authorizing Provider  adapalene (DIFFERIN) 0.1 % gel Apply 1 Application topically at bedtime. 12/24/15   [provider]  albuterol (VENTOLIN HFA) 108 (90 Base) MCG/ACT inhaler Inhale into the lungs. 11/23/18   [provider]  apixaban (ELIQUIS) 5 MG TABS tablet Take 1 tablet (5 mg total) by mouth 2 (two) times daily. START AFTER FINISHING LOVENOX (ENOXAPARIN) 06/27/21 07/27/21  Lanae Boast, MD  apixaban (ELIQUIS) 5 MG TABS tablet Take 1 tablet by mouth 2 (two) times daily.    [provider]  Apple Cider Vinegar 500 MG TABS Take 1 tablet by mouth as needed.    [provider]  Aspirin-Acetaminophen-Caffeine (EXCEDRIN MIGRAINE PO) Excedrin Migraine    [provider]  aspirin-acetaminophen-caffeine (EXCEDRIN MIGRAINE) [phone] MG tablet Take by mouth.    [provider]  atorvastatin (LIPITOR) 40 MG tablet Take 1 tablet by mouth daily. 10/23/17   [provider]  azithromycin (ZITHROMAX) 250 MG tablet azithromycin 250 mg tablet  TAKE TWO TABLETS ON THE FIRST DAY AND THEN ONE TABLET EVERY DAY AFTER.    [provider]  benzonatate (TESSALON) 100 MG capsule TAKE 2 CAPSULES BY MOUTH 3 TIMES A DAY AS NEEDED FOR COUGH FOR UP TO 7 DAYS    [provider]  cetirizine (ZYRTEC) 10 MG tablet Take by mouth.    [provider]  chlorhexidine (PERIDEX) 0.12 % solution (Prior Auth: Rx [id number]) Mouth/Throat for 16    [provider]  chlorpheniramine-HYDROcodone (TUSSIONEX) 10-8 MG/5ML Take 5mg  by mouth every 8 hrs or night time prn cough 08/30/22   [provider]   chlorzoxazone (PARAFON) 500 MG tablet Take by mouth.    [provider]  ciprofloxacin (CIPRO) 250 MG tablet (Prior Auth: Rx [id number]) Oral for 3    [provider]  clindamycin (CLEOCIN) 300 MG capsule (Prior Auth: Rx XLK#:[id number]) Oral for 7    [provider]  Coenzyme Q10 300 MG CAPS Take by mouth.    [provider]  cyclobenzaprine (FLEXERIL) 5 MG tablet Take by mouth. 04/14/17   [provider]  dextromethorphan-guaiFENesin (MUCINEX DM) 30-600 MG 12hr tablet Take 1 tablet by mouth 2 (two) times daily as needed for cough.    [provider]  diazepam (VALIUM) 10 MG tablet diazepam 10 mg tablet  TAKE 1 TABLET BY MOUTH AS DIRECTED 1 HOUR PRIOR TO MRI REPEAT IF NEEDED    [provider]  diclofenac (VOLTAREN) 50 MG EC tablet Take 1 tablet twice a day by oral  route. 10/18/22   [provider]  diclofenac (VOLTAREN) 75 MG EC tablet Take 1 tablet by mouth 2 (two) times daily. 06/27/22   [provider]  diclofenac Sodium (PENNSAID) 2 % SOLN Pennsaid 20 mg/gram/actuation (2 %) topical soln in metered-dose pump  APPLY 2 PUMPS (40 MG) TO THE AFFECTED AREA BY TOPICAL ROUTE 2 TIMES PER DAY    [provider]  Diclofenac Sodium 1.5 % SOLN Please specify directions, refills and quantity    [provider]  diphenhydrAMINE (BENADRYL) 25 mg capsule Take by mouth. 06/11/21   [provider]  doxycycline (VIBRA-TABS) 100 MG tablet TAKE 1 TABLET BY MOUTH 2 TIMES DAILY FOR 10 DAYS.    [provider]  enoxaparin (LOVENOX) 100 MG/ML injection Inject 1 syringe (100 mg total) into the skin 2 (two) times daily for 5 days. 06/22/21 06/27/21  Lanae Boast, MD  EPINEPHrine 0.3 mg/0.3 mL IJ SOAJ injection Inject 0.3 mg into the muscle as needed for anaphylaxis. 06/11/21   [provider]  Ergocalciferol (VITAMIN D2 PO) Vitamin D2 (obsolete)    [provider]  escitalopram  (LEXAPRO) 10 MG tablet escitalopram 10 mg tablet  TAKE 1 TABLET BY MOUTH ONCE DAILY FOR 30 DAYS    [provider]  escitalopram (LEXAPRO) 20 MG tablet Take 20 mg by mouth daily. 01/03/21   [provider]  escitalopram (LEXAPRO) 20 MG tablet Take 1 tablet by mouth daily. 07/04/22   [provider]  Evening Primrose Oil 1000 MG CAPS Take 1 tablet by mouth every morning.    [provider]  famotidine (PEPCID) 40 MG tablet Take by mouth. 08/07/21   [provider]  ferrous sulfate 325 (65 FE) MG tablet Take 1 tablet (325 mg total) by mouth 2 (two) times daily with a meal. 06/22/21 07/22/21  Lanae Boast, MD  fexofenadine (ALLEGRA) 60 MG tablet Take by mouth. 08/07/21   [provider]  fexofenadine-pseudoephedrine (ALLEGRA-D) 60-120 MG 12 hr tablet Take 1 tablet by mouth 2 (two) times daily.    [provider]  Flaxseed, Linseed, (FLAX SEED OIL) 1000 MG CAPS Take by mouth.    [provider]  gabapentin (NEURONTIN) 100 MG capsule gabapentin 100 mg capsule   2 capsules every day by oral route.    [provider]  glucose blood test strip Contour Next Test Strips  USE 1 STRIP TO CHECK GLUCOSE ONCE DAILY    [provider]  HYDRALAZINE-HCTZ PO HCTZ    [provider]  hydrochlorothiazide (HYDRODIURIL) 12.5 MG tablet (Prior Auth: Rx WUJ#:[id number]) Oral for 90    [provider]  hydrochlorothiazide (MICROZIDE) 12.5 MG capsule Take 1 tablet by mouth daily. 11/06/17   [provider]  HYDROcodone bit-homatropine (HYCODAN) 5-1.5 MG/5ML syrup TAKE 5 ML BY MOUTH EVERY 4 HOURS AS NEEDED FOR UP TO 5 DAYS    [provider]  HYDROcodone-acetaminophen (NORCO) 7.5-325 MG tablet (Schedule II Drug)  (Prior Auth: Rx OZH#:[id number]) Oral for 4    [provider]  HYDROcodone-acetaminophen (NORCO/VICODIN) 5-325 MG tablet Take 2 tablets by mouth every 4 (four) hours as needed. 12/24/21    Sponseller, Eugene Gavia, PA-C  HYDROcodone-acetaminophen (NORCO/VICODIN) 5-325 MG tablet hydrocodone 5 mg-acetaminophen 325 mg tablet  TAKE 1 TABLET(S) EVERY 6 HOURS AS NEEDED FOR PAIN.    [provider]  HYDROcodone-homatropine (HYCODAN) 5-1.5 MG/5ML syrup Take 5 mLs by mouth every 6 (six) hours as needed for cough.  12/14/15   [provider]  HYDROCODONE-HOMATROPINE PO (Schedule II Drug)  (Prior Auth: Rx [id number]) Oral for 6    [provider]  ibuprofen (ADVIL) 800 MG tablet Take 800 mg by mouth 3 (three) times daily as needed for moderate pain. 02/04/21   [provider]  ibuprofen (ADVIL) 800 MG tablet ibuprofen 800 mg tablet    [provider]  levothyroxine (SYNTHROID) 25 MCG tablet Take 1 tablet by mouth daily.    [provider]  LEVOTHYROXINE SODIUM PO Levothyroxine    [provider]  liothyronine (CYTOMEL) 5 MCG tablet Take by mouth. 04/05/17   [provider]  losartan-hydrochlorothiazide (HYZAAR) 50-12.5 MG tablet losartan 50 mg-hydrochlorothiazide 12.5 mg tablet  TAKE 1 TABLET BY MOUTH EVERY DAY    [provider]  MAGNESIUM-OXIDE PO Take by mouth.    [provider]  meloxicam (MOBIC) 15 MG tablet Take 1 tablet by mouth daily.    [provider]  methocarbamol (ROBAXIN) 500 MG tablet methocarbamol 500 mg tablet    [provider]  METHYLCOBALAMIN PO Take 1 tablet by mouth every morning.    [provider]  Microlet Lancets MISC Microlet Lancet  USE 1 TO CHECK GLUCOSE ONCE DAILY    [provider]  mirabegron ER (MYRBETRIQ) 50 MG TB24 tablet (Prior Auth: Rx ZOX#:[id number]) Oral for 90    [provider]  montelukast (SINGULAIR) 10 MG tablet Take 10 mg by mouth daily. 06/15/21   [provider]  MULTIPLE VITAMINS PO Take 1 tablet by mouth as needed.    [provider]  Multiple Vitamins-Minerals (HAIR SKIN & NAILS ADVANCED  PO) Hair, Skin & Nails    [provider]  MULTIPLE VITAMINS-MINERALS PO Take 1 tablet by mouth as needed.    [provider]  naproxen sodium (ANAPROX) 550 MG tablet naproxen sodium 550 mg tablet  Please specify directions, refills and quantity    [provider]  Nirmatrelvir-Ritonavir (PAXLOVID PO) Take 3 tablets by mouth in the morning and at bedtime. 300/100 MG    [provider]  nitrofurantoin, macrocrystal-monohydrate, (MACROBID) 100 MG capsule nitrofurantoin monohydrate/macrocrystals 100 mg capsule  TAKE 1 CAPSULE BY MOUTH TWICE A DAY FOR 7 DAYS    [provider]  nystatin-triamcinolone ointment (MYCOLOG) Apply topically. 07/04/22   [provider]  ondansetron (ZOFRAN) 4 MG tablet Take by mouth. 03/29/22   [provider]  ondansetron (ZOFRAN-ODT) 4 MG disintegrating tablet Take 1 tablet (4 mg total) by mouth every 8 (eight) hours as needed for nausea or vomiting. 12/24/21   Sponseller, Eugene Gavia, PA-C  oxyCODONE-acetaminophen (PERCOCET/ROXICET) 5-325 MG tablet (Schedule II Drug)  (Prior Auth: Rx [id number]) Oral for 3    [provider]  penicillin v potassium (VEETID) 500 MG tablet (Prior Auth: Rx BJY#:[id number]) Oral for 7    [provider]  phenazopyridine (PYRIDIUM) 200 MG tablet phenazopyridine 200 mg tablet  TAKE 1 TABLET BY MOUTH 3 TIMES DAILY AS NEEDED FOR UP TO 3 DAYS FOR PAIN.    [provider]  phentermine 15 MG capsule Take by mouth.    [provider]  POTASSIUM PO Potassium    [provider]  pravastatin (PRAVACHOL) 10 MG tablet pravastatin 10 mg tablet  TAKE 1 TABLET BY MOUTH EVERY DAY    [provider]  pravastatin (PRAVACHOL) 20 MG tablet Take 20 mg by mouth at bedtime.  11/04/15   [provider]  PRAVASTATIN SODIUM PO pravastatin  [provider]  prednisoLONE (PRELONE) 15 MG/5ML SOLN Take 13.3 mLs (40 mg total) by mouth  daily before breakfast for 5 days. 02/25/23 03/02/23  Gustavus Bryant, FNP  PROBIOTIC PRODUCT PO Take by mouth.    [provider]  pseudoephedrine (SUDAFED) 30 MG tablet Take 30 mg by mouth every 4 (four) hours as needed for congestion.    [provider]  Salicylic Acid (SALVAX) 6 % FOAM 1 application to affected area Externally once a day to one thigh 12/24/15   [provider]  Semaglutide, 1 MG/DOSE, (OZEMPIC, 1 MG/DOSE,) 4 MG/3ML SOPN Inject 1 mg into the skin once a week. 07/12/22   [provider]  sucralfate (CARAFATE) 1 g tablet Take by mouth. 08/07/21   [provider]  telmisartan (MICARDIS) 40 MG tablet Take 40 mg by mouth daily.    [provider]  telmisartan (MICARDIS) 40 MG tablet Take 1 tablet by mouth daily. 11/03/22   [provider]  telmisartan-hydrochlorothiazide (MICARDIS HCT) 40-12.5 MG tablet Take 1 tablet by mouth daily.    [provider]  tiZANidine (ZANAFLEX) 4 MG tablet Take 1 tablet (4 mg total) by mouth every 6 (six) hours as needed for muscle spasms. 01/30/23   Carlisle Beers, FNP  tobramycin-dexamethasone [facility]) ophthalmic solution tobramycin 0.3 %-dexamethasone 0.1 % eye drops,suspension  INSTILL 1 DROP INTO RIGHT EYE 4 TIMES DAILY FOR 10 DAYS    [provider]  traMADol (ULTRAM) 50 MG tablet Take by mouth. 05/24/21   [provider]  TRAMADOL HCL PO Tramadol    [provider]  traZODone (DESYREL) 50 MG tablet TAKE 1 TABLET BY MOUTH NIGHTLY. TAKE ONE HOUR PRIOR TO BEDTIME 09/30/22   [provider]  triamcinolone cream (KENALOG) 0.1 % APPLY TO AFFECTED AREA 2-3 TIMES DAILY 07/04/22   [provider]  TURMERIC PO Take 1 tablet by mouth as needed.    [provider]  UNABLE TO FIND Take by mouth. On Guard Plus    [provider]  UNABLE TO FIND Take by mouth. Blue Essentials Plus    [provider]  Vitamin D,  Ergocalciferol, (DRISDOL) 1.25 MG (50000 UNIT) CAPS capsule (Prior Auth: Rx [id number]) Oral for 84 08/05/16   [provider]  dicyclomine (BENTYL) 20 MG tablet Take 1 tablet (20 mg total) by mouth every 6 (six) hours. Patient not taking: Reported on 12/17/2015 09/22/14 12/18/15  Glennie Isle L, DO  ipratropium (ATROVENT) 0.03 % nasal spray Place 2 sprays into the nose 4 (four) times daily. Patient not taking: Reported on 12/17/2015 11/28/13 12/18/15  Sherren Mocha, MD  potassium chloride (K-DUR) 10 MEQ tablet Take 1 tablet (10 mEq total) by mouth 2 (two) times daily. Patient not taking: Reported on 12/17/2015 05/15/13 12/18/15  Meda Coffee, DO    Family History Family History  Problem Relation Age of Onset   Skin cancer Maternal Uncle    Hypertension Mother    Hypertension Father    Ovarian cancer Paternal Grandmother    Cancer Paternal Grandmother    Diabetes Maternal Grandmother    Hyperlipidemia Maternal Grandmother     Social History Social History   Tobacco Use   Smoking status: Never   Smokeless tobacco: Never   Tobacco comments:    Married  Building services engineer Use: Never used  Substance Use Topics   Alcohol use: No   Drug use: No     Allergies   Corn-containing  products, Naproxen sodium, Naproxen sodium, Sulfonamide derivatives, Sulfur, Ibuprofen, Glucose, and Penicillins   Review of Systems Review of Systems Per HPI  Physical Exam Triage Vital Signs ED Triage Vitals [02/26/23 1107]  Enc Vitals Group     BP 134/74     Pulse Rate 92     Resp 18     Temp 98.3 F (36.8 C)     Temp Source Oral     SpO2 99 %     Weight      Height      Head Circumference      Peak Flow      Pain Score 3     Pain Loc      Pain Edu?      Excl. in GC?    No data found.  Updated Vital Signs BP 134/74 (BP Location: Left Arm)   Pulse 92   Temp 98.3 F (36.8 C) (Oral)   Resp 18   SpO2 99%   Visual Acuity Right Eye Distance:   Left Eye Distance:    Bilateral Distance:    Right Eye Near:   Left Eye Near:    Bilateral Near:     Physical Exam Constitutional:      General: She is not in acute distress.    Appearance: Normal appearance. She is not toxic-appearing or diaphoretic.  HENT:     Head: Normocephalic and atraumatic.  Eyes:     Extraocular Movements: Extraocular movements intact.     Conjunctiva/sclera: Conjunctivae normal.  Cardiovascular:     Rate and Rhythm: Normal rate and regular rhythm.     Pulses: Normal pulses.     Heart sounds: Normal heart sounds.  Pulmonary:     Effort: Pulmonary effort is normal. No respiratory distress.     Breath sounds: Normal breath sounds. No stridor. No wheezing, rhonchi or rales.  Neurological:     General: No focal deficit present.     Mental Status: She is alert and oriented to person, place, and time. Mental status is at baseline.  Psychiatric:        Mood and Affect: Mood normal.        Behavior: Behavior normal.        Thought Content: Thought content normal.        Judgment: Judgment normal.      UC Treatments / Results  Labs (all labs ordered are listed, but only abnormal results are displayed) Labs Reviewed - No data to display  EKG   Radiology DG Chest 2 View  Result Date: 02/26/2023 CLINICAL DATA:  Follow-up possible pulmonary nodule. EXAM: CHEST - 2 VIEW COMPARISON:  05/20/2021 and CT chest 06/19/2021, right shoulder films 02/25/2023 FINDINGS: Lungs are adequately inflated without airspace consolidation or effusion. No focal nodular opacity over the right mid to upper lung. Cardiomediastinal silhouette and remainder of the exam is unremarkable. IMPRESSION: 1. No acute cardiopulmonary disease. 2. No evidence of nodular opacity over the right mid to upper lung. Electronically Signed   By: Elberta Fortisaniel  Boyle M.D.   On: 02/26/2023 11:38   DG Shoulder Right  Result Date: 02/25/2023 CLINICAL DATA:  Right arm pain EXAM: RIGHT SHOULDER - 3 VIEW COMPARISON:  None Available.  FINDINGS: There is no evidence of fracture or dislocation. No evidence of arthropathy or other focal bone abnormality. Nodular opacity overlying the right hemithorax seen on scapular Y-view. Soft tissues are unremarkable. IMPRESSION: 1. No acute displaced fracture or dislocation of the right shoulder. 2. Nodular opacity  overlying the right hemithorax seen on scapular Y-view possibly artifactual. Recommend PA and lateral chest radiograph to assess for pulmonary nodule. Alternatively, chest CT could be performed. Electronically Signed   By: Allegra Lai M.D.   On: 02/25/2023 15:45    Procedures Procedures (including critical care time)  Medications Ordered in UC Medications - No data to display  Initial Impression / Assessment and Plan / UC Course  I have reviewed the triage vital signs and the nursing notes.  Pertinent labs & imaging results that were available during my care of the patient were reviewed by me and considered in my medical decision making (see chart for details).     Chest x-ray completed today that was negative for any acute abnormality or pulmonary nodule.  Discussed these findings with patient.  Discussed follow-up with PCP or urgent care if she has any further concerns.  Patient verbalized understanding and was agreeable with plan. Final Clinical Impressions(s) / UC Diagnoses   Final diagnoses:  Follow-up exam     Discharge Instructions      X-ray was normal.  Follow-up with PCP for any further concerns.     ED Prescriptions   None    PDMP not reviewed this encounter.   Gustavus Bryant, [place] 02/26/23 [phone]

## 2023-02-27 DIAGNOSIS — J3089 Other allergic rhinitis: Secondary | ICD-10-CM | POA: Diagnosis not present

## 2023-02-27 DIAGNOSIS — T783XXA Angioneurotic edema, initial encounter: Secondary | ICD-10-CM | POA: Diagnosis not present

## 2023-02-27 DIAGNOSIS — L509 Urticaria, unspecified: Secondary | ICD-10-CM | POA: Diagnosis not present

## 2023-02-27 DIAGNOSIS — I10 Essential (primary) hypertension: Secondary | ICD-10-CM | POA: Diagnosis not present

## 2023-02-28 ENCOUNTER — Encounter (HOSPITAL_BASED_OUTPATIENT_CLINIC_OR_DEPARTMENT_OTHER): Payer: Self-pay | Admitting: Emergency Medicine

## 2023-02-28 ENCOUNTER — Emergency Department (HOSPITAL_BASED_OUTPATIENT_CLINIC_OR_DEPARTMENT_OTHER)
Admission: EM | Admit: 2023-02-28 | Discharge: 2023-02-28 | Disposition: A | Discharge status: Home / Self Care | Payer: BC Managed Care – PPO | Attending: Emergency Medicine | Admitting: Emergency Medicine

## 2023-02-28 ENCOUNTER — Other Ambulatory Visit: Payer: Self-pay

## 2023-02-28 DIAGNOSIS — R21 Rash and other nonspecific skin eruption: Secondary | ICD-10-CM | POA: Insufficient documentation

## 2023-02-28 MED ORDER — FAMOTIDINE IN NACL 20-0.9 MG/50ML-% IV SOLN
20.0000 mg | Freq: Once | INTRAVENOUS | Status: AC
  Administered 2023-02-28: 20 mg via INTRAVENOUS
  Filled 2023-02-28: qty 50

## 2023-02-28 MED ORDER — DIPHENHYDRAMINE HCL 50 MG/ML IJ SOLN
25.0000 mg | Freq: Once | INTRAMUSCULAR | Status: AC
  Administered 2023-02-28: 25 mg via INTRAVENOUS
  Filled 2023-02-28: qty 1

## 2023-02-28 MED ORDER — SODIUM CHLORIDE 0.9 % IV BOLUS
1000.0000 mL | Freq: Once | INTRAVENOUS | Status: AC
  Administered 2023-02-28: 1000 mL via INTRAVENOUS

## 2023-02-28 NOTE — Discharge Instructions (Addendum)
You may continue your steroids they are currently taking at home.  If you experience any shortness of breath, closing of your throat, feeling like you are unable to breathe you will need to call 911.

## 2023-02-28 NOTE — ED Triage Notes (Signed)
Pt arrives pov, steady gait with c/o hives on chest and beneath breasts. Reports rash keeps spreading. Pt speaking in complete sentences, states feels like theres tenderness in my neck and "difficult to swallow. Was tx by pcp yesterday for same. Benadryl 25MG , prednisone AND hydroxizine @ 0500

## 2023-02-28 NOTE — ED Provider Notes (Signed)
[place] EMERGENCY DEPARTMENT AT [facility] Provider Note   CSN: [id number] Arrival date & time: 02/28/23  1052     History  Chief Complaint  Patient presents with   Allergic Reaction    Kara Cannon is a 52 y.o. female.  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  When she was previously diagnosed with this, states that she is unsure whether she came in contact with something, but has broken out to her rash over the last couple of days.  Now is having a rash under her breast, to her left side of her neck, to her left AC.  She has been taking steroids which she was prescribed by her primary care physician 2 days ago, however reported that today when she woke up she felt that the rash had worsened under her breast, she also felt like it was difficult for her to swallow.  She is tolerating her secretions.  Also taking some Atarax without any improvement in symptoms.  She denies any diarrhea, shortness of breath, chest pain.  The history is provided by the patient.  Allergic Reaction Presenting symptoms: rash        Home Medications Prior to Admission medications   Medication Sig Start Date End Date Taking? Authorizing Provider  adapalene (DIFFERIN) 0.1 % gel Apply 1 Application topically at bedtime. 12/24/15   [provider]  albuterol (VENTOLIN HFA) 108 (90 Base) MCG/ACT inhaler Inhale into the lungs. 11/23/18   [provider]  apixaban (ELIQUIS) 5 MG TABS tablet Take 1 tablet (5 mg total) by mouth 2 (two) times daily. START AFTER FINISHING LOVENOX (ENOXAPARIN) 06/27/21 07/27/21  Lanae Boast, MD  apixaban (ELIQUIS) 5 MG TABS tablet Take 1 tablet by mouth 2 (two) times daily.    [provider]  Apple Cider Vinegar 500 MG TABS Take 1 tablet by mouth as needed.    [provider]  Aspirin-Acetaminophen-Caffeine (EXCEDRIN  MIGRAINE PO) Excedrin Migraine    [provider]  aspirin-acetaminophen-caffeine (EXCEDRIN MIGRAINE) [phone] MG tablet Take by mouth.    [provider]  atorvastatin (LIPITOR) 40 MG tablet Take 1 tablet by mouth daily. 10/23/17   [provider]  azithromycin (ZITHROMAX) 250 MG tablet azithromycin 250 mg tablet  TAKE TWO TABLETS ON THE FIRST DAY AND THEN ONE TABLET EVERY DAY AFTER.    [provider]  benzonatate (TESSALON) 100 MG capsule TAKE 2 CAPSULES BY MOUTH 3 TIMES A DAY AS NEEDED FOR COUGH FOR UP TO 7 DAYS    [provider]  cetirizine (ZYRTEC) 10 MG tablet Take by mouth.    [provider]  chlorhexidine (PERIDEX) 0.12 % solution (Prior Auth: Rx [id number]) Mouth/Throat for 16    [provider]  chlorpheniramine-HYDROcodone (TUSSIONEX) 10-8 MG/5ML Take 5mg  by mouth every 8 hrs or night time prn cough 08/30/22   [provider]  chlorzoxazone (PARAFON) 500 MG tablet Take by mouth.    [provider]  ciprofloxacin (CIPRO) 250 MG tablet (Prior Auth: Rx [id number]) Oral for 3    [provider]  clindamycin (CLEOCIN) 300 MG capsule (Prior Auth: Rx YQM#:[id number]) Oral for 7    [provider]  Coenzyme Q10 300 MG CAPS Take by mouth.    [provider]  cyclobenzaprine (FLEXERIL) 5 MG tablet Take by mouth. 04/14/17   [provider]  dextromethorphan-guaiFENesin [facility] DM) 30-600  MG 12hr tablet Take 1 tablet by mouth 2 (two) times daily as needed for cough.    [provider]  diazepam (VALIUM) 10 MG tablet diazepam 10 mg tablet  TAKE 1 TABLET BY MOUTH AS DIRECTED 1 HOUR PRIOR TO MRI REPEAT IF NEEDED    [provider]  diclofenac (VOLTAREN) 50 MG EC tablet Take 1 tablet twice a day by oral route. 10/18/22   [provider]  diclofenac (VOLTAREN) 75 MG EC tablet Take 1 tablet by mouth 2 (two) times daily. 06/27/22   [provider]  diclofenac Sodium (PENNSAID) 2 % SOLN Pennsaid 20 mg/gram/actuation (2 %) topical soln in metered-dose pump  APPLY 2 PUMPS (40 MG) TO THE AFFECTED AREA BY TOPICAL ROUTE 2 TIMES PER DAY    [provider]  Diclofenac Sodium 1.5 % SOLN Please specify directions, refills and quantity    [provider]  diphenhydrAMINE (BENADRYL) 25 mg capsule Take by mouth. 06/11/21   [provider]  doxycycline (VIBRA-TABS) 100 MG tablet TAKE 1 TABLET BY MOUTH 2 TIMES DAILY FOR 10 DAYS.    [provider]  enoxaparin (LOVENOX) 100 MG/ML injection Inject 1 syringe (100 mg total) into the skin 2 (two) times daily for 5 days. 06/22/21 06/27/21  Lanae Boast, MD  EPINEPHrine 0.3 mg/0.3 mL IJ SOAJ injection Inject 0.3 mg into the muscle as needed for anaphylaxis. 06/11/21   [provider]  Ergocalciferol (VITAMIN D2 PO) Vitamin D2 (obsolete)    [provider]  escitalopram (LEXAPRO) 10 MG tablet escitalopram 10 mg tablet  TAKE 1 TABLET BY MOUTH ONCE DAILY FOR 30 DAYS    [provider]  escitalopram (LEXAPRO) 20 MG tablet Take 20 mg by mouth daily. 01/03/21   [provider]  escitalopram (LEXAPRO) 20 MG tablet Take 1 tablet by mouth daily. 07/04/22   [provider]  Evening Primrose Oil 1000 MG CAPS Take 1 tablet by mouth every morning.    [provider]  famotidine (PEPCID) 40 MG tablet Take by mouth. 08/07/21   [provider]  ferrous sulfate 325 (65 FE) MG tablet Take 1 tablet (325 mg total) by mouth 2 (two) times daily with a meal. 06/22/21 07/22/21  Lanae Boast, MD  fexofenadine (ALLEGRA) 60 MG tablet Take by mouth. 08/07/21   [provider]  fexofenadine-pseudoephedrine (ALLEGRA-D) 60-120 MG 12 hr tablet Take 1 tablet by mouth 2 (two) times daily.    [provider]  Flaxseed, Linseed, (FLAX SEED OIL) 1000 MG CAPS Take by mouth.    [provider]  gabapentin (NEURONTIN) 100 MG  capsule gabapentin 100 mg capsule   2 capsules every day by oral route.    [provider]  glucose blood test strip Contour Next Test Strips  USE 1 STRIP TO CHECK GLUCOSE ONCE DAILY    [provider]  HYDRALAZINE-HCTZ PO HCTZ    [provider]  hydrochlorothiazide (HYDRODIURIL) 12.5 MG tablet (Prior Auth: Rx FMB#:[id number]) Oral for 90    [provider]  hydrochlorothiazide (MICROZIDE) 12.5 MG capsule Take 1 tablet by mouth daily. 11/06/17   [provider]  HYDROcodone bit-homatropine (HYCODAN) 5-1.5 MG/5ML syrup TAKE 5 ML BY MOUTH EVERY 4 HOURS AS NEEDED FOR UP TO 5 DAYS    [provider]  HYDROcodone-acetaminophen (NORCO) 7.5-325 MG tablet (Schedule II Drug)  (Prior Auth: Rx XBL#:[id number]) Oral for 4    [provider]  HYDROcodone-acetaminophen (NORCO/VICODIN) 5-325 MG tablet Take 2  tablets by mouth every 4 (four) hours as needed. 12/24/21   Sponseller, Eugene Gaviaebekah R, PA-C  HYDROcodone-acetaminophen (NORCO/VICODIN) 5-325 MG tablet hydrocodone 5 mg-acetaminophen 325 mg tablet  TAKE 1 TABLET(S) EVERY 6 HOURS AS NEEDED FOR PAIN.    [provider]  HYDROcodone-homatropine (HYCODAN) 5-1.5 MG/5ML syrup Take 5 mLs by mouth every 6 (six) hours as needed for cough.  12/14/15   [provider]  HYDROCODONE-HOMATROPINE PO (Schedule II Drug)  (Prior Auth: Rx [id number]#:[id number]) Oral for 6    [provider]  ibuprofen (ADVIL) 800 MG tablet Take 800 mg by mouth 3 (three) times daily as needed for moderate pain. 02/04/21   [provider]  ibuprofen (ADVIL) 800 MG tablet ibuprofen 800 mg tablet    [provider]  levothyroxine (SYNTHROID) 25 MCG tablet Take 1 tablet by mouth daily.    [provider]  LEVOTHYROXINE SODIUM PO Levothyroxine    [provider]  liothyronine (CYTOMEL) 5 MCG tablet Take by mouth. 04/05/17   [provider]  losartan-hydrochlorothiazide  (HYZAAR) 50-12.5 MG tablet losartan 50 mg-hydrochlorothiazide 12.5 mg tablet  TAKE 1 TABLET BY MOUTH EVERY DAY    [provider]  MAGNESIUM-OXIDE PO Take by mouth.    [provider]  meloxicam (MOBIC) 15 MG tablet Take 1 tablet by mouth daily.    [provider]  methocarbamol (ROBAXIN) 500 MG tablet methocarbamol 500 mg tablet    [provider]  METHYLCOBALAMIN PO Take 1 tablet by mouth every morning.    [provider]  Microlet Lancets MISC Microlet Lancet  USE 1 TO CHECK GLUCOSE ONCE DAILY    [provider]  mirabegron ER (MYRBETRIQ) 50 MG TB24 tablet (Prior Auth: Rx AOZ#:[id number]#:[id number]) Oral for 90    [provider]  montelukast (SINGULAIR) 10 MG tablet Take 10 mg by mouth daily. 06/15/21   [provider]  MULTIPLE VITAMINS PO Take 1 tablet by mouth as needed.    [provider]  Multiple Vitamins-Minerals (HAIR SKIN & NAILS ADVANCED PO) Hair, Skin & Nails    [provider]  MULTIPLE VITAMINS-MINERALS PO Take 1 tablet by mouth as needed.    [provider]  naproxen sodium (ANAPROX) 550 MG tablet naproxen sodium 550 mg tablet  Please specify directions, refills and quantity    [provider]  Nirmatrelvir-Ritonavir (PAXLOVID PO) Take 3 tablets by mouth in the morning and at bedtime. 300/100 MG    [provider]  nitrofurantoin, macrocrystal-monohydrate, (MACROBID) 100 MG capsule nitrofurantoin monohydrate/macrocrystals 100 mg capsule  TAKE 1 CAPSULE BY MOUTH TWICE A DAY FOR 7 DAYS    [provider]  nystatin-triamcinolone ointment (MYCOLOG) Apply topically. 07/04/22   [provider]  ondansetron (ZOFRAN) 4 MG tablet Take by mouth. 03/29/22   [provider]  ondansetron (ZOFRAN-ODT) 4 MG disintegrating tablet Take 1 tablet (4 mg total) by mouth every 8 (eight) hours as needed for nausea or vomiting. 12/24/21   Sponseller, Eugene Gaviaebekah R, PA-C   oxyCODONE-acetaminophen (PERCOCET/ROXICET) 5-325 MG tablet (Schedule II Drug)  (Prior Auth: Rx [id number]#:[id number]) Oral for 3    [provider]  penicillin v potassium (VEETID) 500 MG tablet (Prior Auth: Rx XBM#:[id number]#:[id number]) Oral for 7    [provider]  phenazopyridine (PYRIDIUM) 200 MG tablet phenazopyridine 200 mg tablet  TAKE 1 TABLET BY MOUTH 3 TIMES DAILY AS NEEDED FOR UP TO 3 DAYS FOR PAIN.    [provider]  phentermine 15 MG capsule Take  by mouth.    [provider]  POTASSIUM PO Potassium    [provider]  pravastatin (PRAVACHOL) 10 MG tablet pravastatin 10 mg tablet  TAKE 1 TABLET BY MOUTH EVERY DAY    [provider]  pravastatin (PRAVACHOL) 20 MG tablet Take 20 mg by mouth at bedtime.  11/04/15   [provider]  PRAVASTATIN SODIUM PO pravastatin    [provider]  prednisoLONE (PRELONE) 15 MG/5ML SOLN Take 13.3 mLs (40 mg total) by mouth daily before breakfast for 5 days. 02/25/23 03/02/23  Gustavus Bryant, FNP  PROBIOTIC PRODUCT PO Take by mouth.    [provider]  pseudoephedrine (SUDAFED) 30 MG tablet Take 30 mg by mouth every 4 (four) hours as needed for congestion.    [provider]  Salicylic Acid (SALVAX) 6 % FOAM 1 application to affected area Externally once a day to one thigh 12/24/15   [provider]  Semaglutide, 1 MG/DOSE, (OZEMPIC, 1 MG/DOSE,) 4 MG/3ML SOPN Inject 1 mg into the skin once a week. 07/12/22   [provider]  sucralfate (CARAFATE) 1 g tablet Take by mouth. 08/07/21   [provider]  telmisartan (MICARDIS) 40 MG tablet Take 40 mg by mouth daily.    [provider]  telmisartan (MICARDIS) 40 MG tablet Take 1 tablet by mouth daily. 11/03/22   [provider]  telmisartan-hydrochlorothiazide (MICARDIS HCT) 40-12.5 MG tablet Take 1 tablet by mouth daily.    [provider]  tiZANidine (ZANAFLEX) 4 MG tablet Take 1  tablet (4 mg total) by mouth every 6 (six) hours as needed for muscle spasms. 01/30/23   Carlisle Beers, FNP  tobramycin-dexamethasone [facility]) ophthalmic solution tobramycin 0.3 %-dexamethasone 0.1 % eye drops,suspension  INSTILL 1 DROP INTO RIGHT EYE 4 TIMES DAILY FOR 10 DAYS    [provider]  traMADol (ULTRAM) 50 MG tablet Take by mouth. 05/24/21   [provider]  TRAMADOL HCL PO Tramadol    [provider]  traZODone (DESYREL) 50 MG tablet TAKE 1 TABLET BY MOUTH NIGHTLY. TAKE ONE HOUR PRIOR TO BEDTIME 09/30/22   [provider]  triamcinolone cream (KENALOG) 0.1 % APPLY TO AFFECTED AREA 2-3 TIMES DAILY 07/04/22   [provider]  TURMERIC PO Take 1 tablet by mouth as needed.    [provider]  UNABLE TO FIND Take by mouth. On Guard Plus    [provider]  UNABLE TO FIND Take by mouth. Blue Essentials Plus    [provider]  Vitamin D, Ergocalciferol, (DRISDOL) 1.25 MG (50000 UNIT) CAPS capsule (Prior Auth: Rx [id number]) Oral for 84 08/05/16   [provider]  dicyclomine (BENTYL) 20 MG tablet Take 1 tablet (20 mg total) by mouth every 6 (six) hours. Patient not taking: Reported on 12/17/2015 09/22/14 12/18/15  Glennie Isle L, DO  ipratropium (ATROVENT) 0.03 % nasal spray Place 2 sprays into the nose 4 (four) times daily. Patient not taking: Reported on 12/17/2015 11/28/13 12/18/15  Sherren Mocha, MD  potassium chloride (K-DUR) 10 MEQ tablet Take 1 tablet (10 mEq total) by mouth 2 (two) times daily. Patient not taking: Reported on 12/17/2015 05/15/13 12/18/15  Artist Pais, Doe-Hyun R, DO      Allergies    Corn-containing products, Naproxen sodium, Naproxen sodium, Sulfonamide derivatives, Sulfur, Ibuprofen, Glucose, and Penicillins    Review of Systems   Review of Systems  Constitutional:  Negative for fever.  HENT:  Negative for sore throat.  Respiratory:  Negative for shortness of breath.    Cardiovascular:  Negative for chest pain.  Gastrointestinal:  Negative for abdominal pain, nausea and vomiting.  Genitourinary:  Negative for flank pain.  Musculoskeletal:  Negative for back pain.  Skin:  Positive for rash.  Neurological:  Negative for light-headedness and headaches.  All other systems reviewed and are negative.   Physical Exam Updated Vital Signs BP 121/77   Pulse 79   Temp 98.3 F (36.8 C) (Oral)   Resp 18   Ht 5\' 6"  (1.676 m)   Wt 93 kg   SpO2 99%   BMI 33.09 kg/m  Physical Exam Vitals and nursing note reviewed.  Constitutional:      Appearance: Normal appearance.  HENT:     Head: Normocephalic and atraumatic.     Nose: No congestion or rhinorrhea.     Mouth/Throat:     Mouth: Mucous membranes are moist.  Eyes:     Pupils: Pupils are equal, round, and reactive to light.  Cardiovascular:     Rate and Rhythm: Normal rate.  Pulmonary:     Effort: Pulmonary effort is normal.     Comments: No wheezing, rales noted. Abdominal:     General: Abdomen is flat.  Musculoskeletal:     Cervical back: Normal range of motion and neck supple.  Skin:    General: Skin is warm and dry.     Findings: Rash present.     Comments: Diffuse rash below her breasts.   Neurological:     Mental Status: She is alert and oriented to person, place, and time.     ED Results / Procedures / Treatments   Labs (all labs ordered are listed, but only abnormal results are displayed) Labs Reviewed - No data to display  EKG None  Radiology No results found.  Procedures Procedures    Medications Ordered in ED Medications  famotidine (PEPCID) IVPB 20 mg premix (0 mg Intravenous Stopped 02/28/23 1516)  diphenhydrAMINE (BENADRYL) injection 25 mg (25 mg Intravenous Given 02/28/23 1410)  sodium chloride 0.9 % bolus 1,000 mL (0 mLs Intravenous Stopped 02/28/23 1516)    ED Course/ Medical Decision Making/ A&P                             Medical Decision  Making Risk Prescription drug management.     Patient presents to the ED with a chief complaint of rash that has been ongoing for the past 3 days.  According to patient she was exposed to an unknown allergen, when suddenly developed a rash.  She was also treated by her primary care physician with steroids, she was also seen by allergist approximately 2 years ago where she was diagnosed with idiopathic angioedema urticaria, states that she is unsure what is triggering her symptoms, however these are recurrent.  She did get a prescription for an EpiPen yesterday, however reports she is unable to afford this as it is a total of $280.  Evaluation today she is not wheezing, vitals are within normal limits, no signs of hypotension, no signs of systemic involvement.  She did have some tickle in her throat however oropharynx is clear without any swelling or signs of angioedema.  She is satting at 100% on room air without any signs of tachypnea.  I discussed with her continuing steroid therapy.  She did receive here Pepcid, Nadryl, bolus to help with symptomatic treatment, there is some improvement in  her rash after this treatment.  She is not having any systemic signs, we did discuss further evaluation with her allergist.  She should return to the emergency department if her symptoms do worsen or involve multiple systems.  Agreeable to plan and treatment, stable for discharge.   Portions of this note were generated with Scientist, clinical (histocompatibility and immunogenetics)Dragon dictation software. Dictation errors may occur despite best attempts at proofreading.   Final Clinical Impression(s) / ED Diagnoses Final diagnoses:  Rash    Rx / DC Orders ED Discharge Orders     None         Claude MangesSoto, Lyberti Thrush, PA-C 02/28/23 1521    Derwood KaplanNanavati, Ankit, MD 03/07/23 0020

## 2023-03-25 ENCOUNTER — Ambulatory Visit
Admission: RE | Admit: 2023-03-25 | Discharge: 2023-03-25 | Disposition: A | Discharge status: Home / Self Care | Payer: BC Managed Care – PPO | Source: Ambulatory Visit | Attending: Family Medicine | Admitting: Family Medicine

## 2023-03-25 ENCOUNTER — Other Ambulatory Visit: Payer: Self-pay

## 2023-03-25 VITALS — BP 155/92 | HR 88 | Temp 98.7°F | Resp 20

## 2023-03-25 DIAGNOSIS — J039 Acute tonsillitis, unspecified: Secondary | ICD-10-CM

## 2023-03-25 DIAGNOSIS — K122 Cellulitis and abscess of mouth: Secondary | ICD-10-CM | POA: Diagnosis not present

## 2023-03-25 DIAGNOSIS — R059 Cough, unspecified: Secondary | ICD-10-CM

## 2023-03-25 MED ORDER — AMOXICILLIN-POT CLAVULANATE 875-125 MG PO TABS: 1.0000 | ORAL_TABLET | Freq: Two times a day (BID) | ORAL | 0 refills | Status: AC

## 2023-03-25 MED ORDER — PREDNISONE 20 MG PO TABS: ORAL_TABLET | ORAL | 0 refills | Status: DC

## 2023-03-25 MED ORDER — BENZONATATE 200 MG PO CAPS: 200.0000 mg | ORAL_CAPSULE | Freq: Three times a day (TID) | ORAL | 0 refills | Status: AC | PRN

## 2023-03-25 NOTE — Discharge Instructions (Addendum)
Advised patient to take medication as directed with food to completion.  Advised patient to take prednisone with first dose of Augmentin for the next 5 of 10 days.  Advised may use Tessalon perles daily or as needed for cough.  Encouraged increase daily water intake to 64 ounces per day while taking these medications.  Advised if symptoms worsen and/or unresolved please follow-up with PCP or here for further evaluation.

## 2023-03-25 NOTE — ED Triage Notes (Signed)
Pt reports since Wed. She has had a cough,sore throat,and today wheezing . Pt is also hoarse.

## 2023-03-25 NOTE — ED Provider Notes (Signed)
[facility]    CSN: [id number] Arrival date & time: 03/25/23  0907      History   Chief Complaint Chief Complaint  Patient presents with   Cough    Entered by patient   Sore Throat   Hoarse   Wheezing    HPI Kara Cannon is a 52 y.o. female.   HPI Very pleasant 52 year old female presents with cough, sore throat, hoarse and wheezing for 4 days.  Patient reports lost voice 2 days ago difficult to speak.  PMH significant for T2DM, obesity, and cardiac murmur, mitral regurgitation.  Patient companied by her husband this morning.  Past Medical History:  Diagnosis Date   Abnormal ultrasound 03/02/2017   Formatting of this note might be different from the original. 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   Headache(784.0) 06/22/2009   HEMORRHOIDS, INTERNAL 06/23/2003   Hyperlipidemia    HYPERTENSION 06/22/2009   Hypochromic microcytic anemia 08/07/2021   IC (interstitial cystitis)    Impingement syndrome of right shoulder region 07/13/2020   Iron deficiency anemia due to chronic blood loss 04/28/2022   Keratosis pilaris 08/07/2021   Left lower quadrant pain 02/25/2023   Left wrist pain  01/13/2022   MIGRAINE HEADACHE 06/22/2009   Morbid obesity (HCC) 02/25/2023   Obesity due to excess calories with serious comorbidity 11/17/2016   OSA (obstructive sleep apnea) 03/29/2022   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   Pain in joint of right shoulder 11/07/2019   Perennial allergic rhinitis with seasonal variation 09/07/2019   PLANTAR WART, LEFT 08/03/2009   Postmenopausal bleeding 02/25/2023   Premature ovarian failure    Radial styloid tenosynovitis of left hand 05/06/2022   Raised TSH level 02/25/2023   Reactive airway disease without asthma 09/07/2019   Rectal bleeding 02/25/2023   Screening for malignant neoplasm of colon 02/25/2023   Tenosynovitis, wrist 05/06/2022   Type 2 diabetes mellitus with hyperglycemia (HCC) 04/05/2019   Unspecified vitamin D deficiency 06/22/2009   Urinary frequency 06/22/2009   UTI'S, HX OF 06/22/2009   Vomiting without nausea 02/25/2023    Patient Active Problem List   Diagnosis Date Noted   Respiratory failure (HCC) 06/21/2021   Pulmonary emboli (HCC) 06/20/2021   Bilateral pulmonary embolism (HCC)    Acute respiratory failure with hypoxia (HCC)    Pulmonary embolism (HCC) 06/19/2021   Premature ovarian failure 09/20/2016   IC (interstitial cystitis) 09/20/2016   Other specified hypothyroidism 09/22/2014   Back spasm 07/05/2013   Other abnormal glucose 04/05/2013   Mixed hyperlipidemia 04/05/2013   Generalized anxiety disorder 03/22/2013   PLANTAR WART, LEFT 08/03/2009   Vitamin D deficiency 06/22/2009   Migraine without aura 06/22/2009   EPISCLERITIS 06/22/2009   Benign essential hypertension  06/22/2009   INTERSTITIAL CYSTITIS 06/22/2009   EFFUSION OF ANKLE AND FOOT JOINT 06/22/2009   Cardiac murmur 06/22/2009   UTI'S, HX OF 06/22/2009   Personal history presenting hazards to health 06/22/2009   Hemorrhoids, internal 06/23/2003    Past Surgical History:  Procedure Laterality Date   CYSTOSCOPY   02/19/2009   Dr. Annabell Howells   FRACTURE SURGERY      OB History     Gravida  1   Para      Term      Preterm      AB  1   Living  0      SAB      IAB      Ectopic      Multiple      Live Births               Home Medications    Prior to Admission medications   Medication Sig Start Date End Date Taking? Authorizing Provider  amoxicillin-clavulanate (AUGMENTIN) 875-125 MG tablet Take 1 tablet by mouth 2 (two) times daily for 10 days. 03/25/23 04/04/23 Yes Trevor Iha, FNP  benzonatate (TESSALON) 200 MG capsule Take 1 capsule (200 mg total) by mouth 3 (three) times daily as needed for up to 7 days. 03/25/23 04/01/23 Yes Trevor Iha, FNP  predniSONE (DELTASONE) 20 MG tablet Take 3 tabs PO daily x 5 days. 03/25/23  Yes Trevor Iha, FNP  adapalene (DIFFERIN) 0.1 % gel Apply 1 Application topically at bedtime. 12/24/15   [provider]  albuterol (VENTOLIN HFA) 108 (90 Base) MCG/ACT inhaler Inhale into the lungs. 11/23/18   [provider]  Apple Cider Vinegar 500 MG TABS Take 1 tablet by mouth as needed.    [provider]  Aspirin-Acetaminophen-Caffeine (EXCEDRIN MIGRAINE PO) Excedrin Migraine    [provider]  aspirin-acetaminophen-caffeine (EXCEDRIN MIGRAINE) [phone] MG tablet Take by mouth.    [provider]  atorvastatin (LIPITOR) 40 MG tablet Take 1 tablet by mouth daily. 10/23/17   [provider]  azithromycin (ZITHROMAX) 250 MG tablet azithromycin 250 mg tablet  TAKE TWO TABLETS ON THE FIRST DAY AND THEN ONE TABLET EVERY DAY AFTER.    [provider]  cetirizine (ZYRTEC) 10 MG tablet Take by mouth.    [provider]  chlorhexidine (PERIDEX) 0.12 % solution (Prior Auth: Rx [id number]) Mouth/Throat for 16    [provider]  chlorpheniramine-HYDROcodone (TUSSIONEX) 10-8 MG/5ML Take 5mg  by mouth every 8 hrs or night time prn cough 08/30/22   [provider]   chlorzoxazone (PARAFON) 500 MG tablet Take by mouth.    [provider]  ciprofloxacin (CIPRO) 250 MG tablet (Prior Auth: Rx [id number]) Oral for 3    [provider]  clindamycin (CLEOCIN) 300 MG capsule (Prior Auth: Rx CZY#:[id number]) Oral for 7    [provider]  Coenzyme Q10 300 MG CAPS Take by mouth.    [provider]  cyclobenzaprine (FLEXERIL) 5 MG tablet Take by mouth. 04/14/17   [provider]  dextromethorphan-guaiFENesin (MUCINEX DM) 30-600 MG 12hr tablet Take 1 tablet by mouth 2 (two) times daily as needed for cough.    [provider]  diazepam (VALIUM) 10 MG tablet diazepam 10 mg tablet  TAKE 1 TABLET BY MOUTH AS DIRECTED 1 HOUR PRIOR TO MRI REPEAT IF NEEDED    [provider]  diclofenac (VOLTAREN) 50 MG EC tablet Take 1 tablet twice a day by oral route. 10/18/22  [provider]  diclofenac (VOLTAREN) 75 MG EC tablet Take 1 tablet by mouth 2 (two) times daily. 06/27/22   [provider]  diclofenac Sodium (PENNSAID) 2 % SOLN Pennsaid 20 mg/gram/actuation (2 %) topical soln in metered-dose pump  APPLY 2 PUMPS (40 MG) TO THE AFFECTED AREA BY TOPICAL ROUTE 2 TIMES PER DAY    [provider]  Diclofenac Sodium 1.5 % SOLN Please specify directions, refills and quantity    [provider]  diphenhydrAMINE (BENADRYL) 25 mg capsule Take by mouth. 06/11/21   [provider]  doxycycline (VIBRA-TABS) 100 MG tablet TAKE 1 TABLET BY MOUTH 2 TIMES DAILY FOR 10 DAYS.    [provider]  enoxaparin (LOVENOX) 100 MG/ML injection Inject 1 syringe (100 mg total) into the skin 2 (two) times daily for 5 days. 06/22/21 06/27/21  Lanae Boast, MD  EPINEPHrine 0.3 mg/0.3 mL IJ SOAJ injection Inject 0.3 mg into the muscle as needed for anaphylaxis. 06/11/21   [provider]  Ergocalciferol (VITAMIN D2 PO) Vitamin D2 (obsolete)    [provider]  escitalopram  (LEXAPRO) 10 MG tablet escitalopram 10 mg tablet  TAKE 1 TABLET BY MOUTH ONCE DAILY FOR 30 DAYS    [provider]  escitalopram (LEXAPRO) 20 MG tablet Take 20 mg by mouth daily. 01/03/21   [provider]  escitalopram (LEXAPRO) 20 MG tablet Take 1 tablet by mouth daily. 07/04/22   [provider]  Evening Primrose Oil 1000 MG CAPS Take 1 tablet by mouth every morning.    [provider]  famotidine (PEPCID) 40 MG tablet Take by mouth. 08/07/21   [provider]  ferrous sulfate 325 (65 FE) MG tablet Take 1 tablet (325 mg total) by mouth 2 (two) times daily with a meal. 06/22/21 07/22/21  Lanae Boast, MD  fexofenadine (ALLEGRA) 60 MG tablet Take by mouth. 08/07/21   [provider]  fexofenadine-pseudoephedrine (ALLEGRA-D) 60-120 MG 12 hr tablet Take 1 tablet by mouth 2 (two) times daily.    [provider]  Flaxseed, Linseed, (FLAX SEED OIL) 1000 MG CAPS Take by mouth.    [provider]  gabapentin (NEURONTIN) 100 MG capsule gabapentin 100 mg capsule   2 capsules every day by oral route.    [provider]  glucose blood test strip Contour Next Test Strips  USE 1 STRIP TO CHECK GLUCOSE ONCE DAILY    [provider]  HYDRALAZINE-HCTZ PO HCTZ    [provider]  hydrochlorothiazide (HYDRODIURIL) 12.5 MG tablet (Prior Auth: Rx EAV#:[id number]) Oral for 90    [provider]  hydrochlorothiazide (MICROZIDE) 12.5 MG capsule Take 1 tablet by mouth daily. 11/06/17   [provider]  HYDROcodone bit-homatropine (HYCODAN) 5-1.5 MG/5ML syrup TAKE 5 ML BY MOUTH EVERY 4 HOURS AS NEEDED FOR UP TO 5 DAYS    [provider]  HYDROcodone-acetaminophen (NORCO) 7.5-325 MG tablet (Schedule II Drug)  (Prior Auth: Rx NFA#:[id number]) Oral for 4    [provider]  HYDROcodone-acetaminophen (NORCO/VICODIN) 5-325 MG tablet Take 2 tablets by mouth every 4 (four) hours as needed. 12/24/21    Sponseller, Eugene Gavia, PA-C  HYDROcodone-acetaminophen (NORCO/VICODIN) 5-325 MG tablet hydrocodone 5 mg-acetaminophen 325 mg tablet  TAKE 1 TABLET(S) EVERY 6 HOURS AS NEEDED FOR PAIN.    [provider]  HYDROcodone-homatropine (HYCODAN) 5-1.5 MG/5ML syrup Take 5 mLs by mouth every 6 (six) hours as needed for cough.  12/14/15   [provider]  Romana Juniper  PO (Schedule II Drug)  (Prior Auth: Rx [id number]) Oral for 6    [provider]  ibuprofen (ADVIL) 800 MG tablet Take 800 mg by mouth 3 (three) times daily as needed for moderate pain. 02/04/21   [provider]  ibuprofen (ADVIL) 800 MG tablet ibuprofen 800 mg tablet    [provider]  levothyroxine (SYNTHROID) 25 MCG tablet Take 1 tablet by mouth daily.    [provider]  LEVOTHYROXINE SODIUM PO Levothyroxine    [provider]  liothyronine (CYTOMEL) 5 MCG tablet Take by mouth. 04/05/17   [provider]  losartan-hydrochlorothiazide (HYZAAR) 50-12.5 MG tablet losartan 50 mg-hydrochlorothiazide 12.5 mg tablet  TAKE 1 TABLET BY MOUTH EVERY DAY    [provider]  MAGNESIUM-OXIDE PO Take by mouth.    [provider]  meloxicam (MOBIC) 15 MG tablet Take 1 tablet by mouth daily.    [provider]  methocarbamol (ROBAXIN) 500 MG tablet methocarbamol 500 mg tablet    [provider]  METHYLCOBALAMIN PO Take 1 tablet by mouth every morning.    [provider]  Microlet Lancets MISC Microlet Lancet  USE 1 TO CHECK GLUCOSE ONCE DAILY    [provider]  mirabegron ER (MYRBETRIQ) 50 MG TB24 tablet (Prior Auth: Rx ZOX#:[id number]) Oral for 90    [provider]  montelukast (SINGULAIR) 10 MG tablet Take 10 mg by mouth daily. 06/15/21   [provider]  MULTIPLE VITAMINS PO Take 1 tablet by mouth as needed.    [provider]  Multiple Vitamins-Minerals (HAIR SKIN & NAILS ADVANCED  PO) Hair, Skin & Nails    [provider]  MULTIPLE VITAMINS-MINERALS PO Take 1 tablet by mouth as needed.    [provider]  naproxen sodium (ANAPROX) 550 MG tablet naproxen sodium 550 mg tablet  Please specify directions, refills and quantity    [provider]  Nirmatrelvir-Ritonavir (PAXLOVID PO) Take 3 tablets by mouth in the morning and at bedtime. 300/100 MG    [provider]  nitrofurantoin, macrocrystal-monohydrate, (MACROBID) 100 MG capsule nitrofurantoin monohydrate/macrocrystals 100 mg capsule  TAKE 1 CAPSULE BY MOUTH TWICE A DAY FOR 7 DAYS    [provider]  nystatin-triamcinolone ointment (MYCOLOG) Apply topically. 07/04/22   [provider]  ondansetron (ZOFRAN) 4 MG tablet Take by mouth. 03/29/22   [provider]  ondansetron (ZOFRAN-ODT) 4 MG disintegrating tablet Take 1 tablet (4 mg total) by mouth every 8 (eight) hours as needed for nausea or vomiting. 12/24/21   Sponseller, Eugene Gavia, PA-C  oxyCODONE-acetaminophen (PERCOCET/ROXICET) 5-325 MG tablet (Schedule II Drug)  (Prior Auth: Rx [id number]) Oral for 3    [provider]  penicillin v potassium (VEETID) 500 MG tablet (Prior Auth: Rx BJY#:[id number]) Oral for 7    [provider]  phenazopyridine (PYRIDIUM) 200 MG tablet phenazopyridine 200 mg tablet  TAKE 1 TABLET BY MOUTH 3 TIMES DAILY AS NEEDED FOR UP TO 3 DAYS FOR PAIN.    [provider]  phentermine 15 MG capsule Take by mouth.    [provider]  POTASSIUM PO Potassium    [provider]  pravastatin (PRAVACHOL) 10 MG tablet pravastatin 10 mg tablet  TAKE 1 TABLET BY MOUTH EVERY DAY    [provider]  pravastatin (PRAVACHOL) 20 MG tablet Take 20 mg by mouth at bedtime.  11/04/15   [provider]  PRAVASTATIN SODIUM PO pravastatin    [provider]  PROBIOTIC PRODUCT PO Take by mouth.    [provider]   pseudoephedrine (SUDAFED) 30 MG tablet Take 30 mg by mouth every 4 (four) hours as needed for congestion.    [provider]  Salicylic Acid (SALVAX) 6 % FOAM 1 application to affected area Externally once a day to one thigh 12/24/15   [provider]  Semaglutide, 1 MG/DOSE, (OZEMPIC, 1 MG/DOSE,) 4 MG/3ML SOPN Inject 1 mg into the skin once a week. 07/12/22   [provider]  sucralfate (CARAFATE) 1 g tablet Take by mouth. 08/07/21   [provider]  telmisartan (MICARDIS) 40 MG tablet Take 40 mg by mouth daily.    [provider]  telmisartan (MICARDIS) 40 MG tablet Take 1 tablet by mouth daily. 11/03/22   [provider]  telmisartan-hydrochlorothiazide (MICARDIS HCT) 40-12.5 MG tablet Take 1 tablet by mouth daily.    [provider]  tiZANidine (ZANAFLEX) 4 MG tablet Take 1 tablet (4 mg total) by mouth every 6 (six) hours as needed for muscle spasms. 01/30/23   Carlisle Beers, FNP  tobramycin-dexamethasone [facility]) ophthalmic solution tobramycin 0.3 %-dexamethasone 0.1 % eye drops,suspension  INSTILL 1 DROP INTO RIGHT EYE 4 TIMES DAILY FOR 10 DAYS    [provider]  traMADol (ULTRAM) 50 MG tablet Take by mouth. 05/24/21   [provider]  TRAMADOL HCL PO Tramadol    [provider]  traZODone (DESYREL) 50 MG tablet TAKE 1 TABLET BY MOUTH NIGHTLY. TAKE ONE HOUR PRIOR TO BEDTIME 09/30/22   [provider]  triamcinolone cream (KENALOG) 0.1 % APPLY TO AFFECTED AREA 2-3 TIMES DAILY 07/04/22   [provider]  TURMERIC PO Take 1 tablet by mouth as needed.    [provider]  UNABLE TO FIND Take by mouth. On Guard Plus    [provider]  UNABLE TO FIND Take by mouth. Blue Essentials Plus    [provider]  Vitamin D, Ergocalciferol, (DRISDOL) 1.25 MG (50000 UNIT) CAPS capsule (Prior Auth: Rx [id number]) Oral for 84 08/05/16   [provider]   dicyclomine (BENTYL) 20 MG tablet Take 1 tablet (20 mg total) by mouth every 6 (six) hours. Patient not taking: Reported on 12/17/2015 09/22/14 12/18/15  Glennie Isle L, DO  ipratropium (ATROVENT) 0.03 % nasal spray Place 2 sprays into the nose 4 (four) times daily. Patient not taking: Reported on 12/17/2015 11/28/13 12/18/15  Sherren Mocha, MD  potassium chloride (K-DUR) 10 MEQ tablet Take 1 tablet (10 mEq total) by mouth 2 (two) times daily. Patient not taking: Reported on 12/17/2015 05/15/13 12/18/15  Meda Coffee, DO    Family History Family History  Problem Relation Age of Onset   Skin cancer Maternal Uncle    Hypertension Mother    Hypertension Father    Ovarian cancer Paternal Grandmother    Cancer Paternal Grandmother    Diabetes Maternal Grandmother    Hyperlipidemia Maternal Grandmother     Social History Social History   Tobacco Use   Smoking status: Never   Smokeless tobacco: Never   Tobacco comments:    Married  Building services engineer Use: Never used  Substance Use Topics   Alcohol use: No   Drug use: No     Allergies   Corn-containing products, Naproxen sodium, Naproxen sodium, Sulfonamide derivatives, Sulfur, Ibuprofen, Glucose, and Penicillins   Review of Systems Review of Systems  HENT:  Positive for congestion, sore throat and voice change.  Respiratory:  Positive for cough and wheezing.   All other systems reviewed and are negative.    Physical Exam Triage Vital Signs ED Triage Vitals [03/25/23 0922]  Enc Vitals Group     BP (!) 155/92     Pulse Rate 88     Resp 20     Temp 98.7 F (37.1 C)     Temp src      SpO2 98 %     Weight      Height      Head Circumference      Peak Flow      Pain Score      Pain Loc      Pain Edu?      Excl. in GC?    No data found.  Updated Vital Signs BP (!) 155/92 Comment: PT did not take BP this morning  Pulse 88   Temp 98.7 F (37.1 C)   Resp 20   SpO2 98%     Physical Exam Vitals and nursing  note reviewed.  Constitutional:      Appearance: She is well-developed.  HENT:     Head: Normocephalic and atraumatic.     Right Ear: Tympanic membrane and ear canal normal.     Left Ear: Tympanic membrane and ear canal normal.     Mouth/Throat:     Mouth: Mucous membranes are moist.     Pharynx: Uvula midline. Pharyngeal swelling, posterior oropharyngeal erythema and uvula swelling present.     Tonsils: 4+ on the right. 4+ on the left.  Eyes:     Conjunctiva/sclera: Conjunctivae normal.     Pupils: Pupils are equal, round, and reactive to light.  Cardiovascular:     Rate and Rhythm: Normal rate and regular rhythm.     Heart sounds: Normal heart sounds.  Pulmonary:     Effort: Pulmonary effort is normal.     Breath sounds: Normal breath sounds. No wheezing, rhonchi or rales.     Comments: Infrequent nonproductive cough noted on exam Musculoskeletal:     Cervical back: Normal range of motion and neck supple.  Skin:    General: Skin is warm and dry.  Neurological:     General: No focal deficit present.     Mental Status: She is alert and oriented to person, place, and time.      UC Treatments / Results  Labs (all labs ordered are listed, but only abnormal results are displayed) Labs Reviewed - No data to display  EKG   Radiology No results found.  Procedures Procedures (including critical care time)  Medications Ordered in UC Medications - No data to display  Initial Impression / Assessment and Plan / UC Course  I have reviewed the triage vital signs and the nursing notes.  Pertinent labs & imaging results that were available during my care of the patient were reviewed by me and considered in my medical decision making (see chart for details).     MDM: 1.  Acute tonsillitis, unspecified etiology-Rx'd Augmentin 875/125 mg twice daily x 10 days patient reports taking this medication several times before without adverse reactions.;  2.  Uvulitis-same as 1 Rx'd  Augmentin; 3.  Cough-Rx'd prednisone 60 mg daily x 5 days and Tessalon 200 mg 3 times daily, as needed patient reports taking these medications several times without adverse reactions. Advised patient to take medication as directed with food to completion.  Advised patient to take prednisone with first dose of Augmentin for  the next 5 of 10 days.  Advised may use Tessalon perles daily or as needed for cough.  Encouraged increase daily water intake to 64 ounces per day while taking these medications.  Advised if symptoms worsen and/or unresolved please follow-up with PCP or here for further evaluation.  Patient discharged home, hemodynamically stable. Final Clinical Impressions(s) / UC Diagnoses   Final diagnoses:  Acute tonsillitis, unspecified etiology  Uvulitis  Cough, unspecified type     Discharge Instructions      Advised patient to take medication as directed with food to completion.  Advised patient to take prednisone with first dose of Augmentin for the next 5 of 10 days.  Advised may use Tessalon perles daily or as needed for cough.  Encouraged increase daily water intake to 64 ounces per day while taking these medications.  Advised if symptoms worsen and/or unresolved please follow-up with PCP or here for further evaluation.     ED Prescriptions     Medication Sig Dispense Auth. Provider   amoxicillin-clavulanate (AUGMENTIN) 875-125 MG tablet Take 1 tablet by mouth 2 (two) times daily for 10 days. 20 tablet Trevor Iha, FNP   predniSONE (DELTASONE) 20 MG tablet Take 3 tabs PO daily x 5 days. 15 tablet Trevor Iha, FNP   benzonatate (TESSALON) 200 MG capsule Take 1 capsule (200 mg total) by mouth 3 (three) times daily as needed for up to 7 days. 40 capsule Trevor Iha, FNP      PDMP not reviewed this encounter.   Trevor Iha, FNP 03/25/23 1016

## 2023-03-26 ENCOUNTER — Ambulatory Visit: Payer: BC Managed Care – PPO

## 2023-04-27 DIAGNOSIS — Z1231 Encounter for screening mammogram for malignant neoplasm of breast: Secondary | ICD-10-CM | POA: Diagnosis not present

## 2023-04-27 DIAGNOSIS — Z0000 Encounter for general adult medical examination without abnormal findings: Secondary | ICD-10-CM | POA: Diagnosis not present

## 2023-04-27 DIAGNOSIS — Z131 Encounter for screening for diabetes mellitus: Secondary | ICD-10-CM | POA: Diagnosis not present

## 2023-04-27 DIAGNOSIS — Z124 Encounter for screening for malignant neoplasm of cervix: Secondary | ICD-10-CM | POA: Diagnosis not present

## 2023-04-27 DIAGNOSIS — Z1329 Encounter for screening for other suspected endocrine disorder: Secondary | ICD-10-CM | POA: Diagnosis not present

## 2023-04-27 DIAGNOSIS — Z01419 Encounter for gynecological examination (general) (routine) without abnormal findings: Secondary | ICD-10-CM | POA: Diagnosis not present

## 2023-04-27 DIAGNOSIS — I2699 Other pulmonary embolism without acute cor pulmonale: Secondary | ICD-10-CM | POA: Insufficient documentation

## 2023-04-27 DIAGNOSIS — Z13220 Encounter for screening for lipoid disorders: Secondary | ICD-10-CM | POA: Diagnosis not present

## 2023-04-27 LAB — HM PAP SMEAR: HPV, high-risk: NEGATIVE

## 2023-04-27 LAB — HM MAMMOGRAPHY

## 2023-04-27 LAB — RESULTS CONSOLE HPV: CHL HPV: NEGATIVE

## 2023-05-01 DIAGNOSIS — J301 Allergic rhinitis due to pollen: Secondary | ICD-10-CM | POA: Diagnosis not present

## 2023-05-01 DIAGNOSIS — R052 Subacute cough: Secondary | ICD-10-CM | POA: Diagnosis not present

## 2023-05-01 DIAGNOSIS — L501 Idiopathic urticaria: Secondary | ICD-10-CM | POA: Diagnosis not present

## 2023-05-11 DIAGNOSIS — E1165 Type 2 diabetes mellitus with hyperglycemia: Secondary | ICD-10-CM | POA: Diagnosis not present

## 2023-05-11 DIAGNOSIS — J3089 Other allergic rhinitis: Secondary | ICD-10-CM | POA: Diagnosis not present

## 2023-05-11 DIAGNOSIS — L509 Urticaria, unspecified: Secondary | ICD-10-CM | POA: Diagnosis not present

## 2023-05-11 DIAGNOSIS — R600 Localized edema: Secondary | ICD-10-CM | POA: Diagnosis not present

## 2023-06-08 ENCOUNTER — Ambulatory Visit
Admission: RE | Admit: 2023-06-08 | Discharge: 2023-06-08 | Disposition: A | Discharge status: Home / Self Care | Payer: BC Managed Care – PPO | Source: Ambulatory Visit | Attending: Family Medicine | Admitting: Family Medicine

## 2023-06-08 VITALS — BP 137/81 | HR 72 | Temp 98.5°F | Resp 16

## 2023-06-08 DIAGNOSIS — J069 Acute upper respiratory infection, unspecified: Secondary | ICD-10-CM

## 2023-06-08 LAB — POC SARS CORONAVIRUS 2 AG -  ED: SARS Coronavirus 2 Ag: NEGATIVE

## 2023-06-08 MED ORDER — ONDANSETRON 4 MG PO TBDP: 4.0000 mg | ORAL_TABLET | Freq: Three times a day (TID) | ORAL | 0 refills | Status: DC | PRN

## 2023-06-08 MED ORDER — HYDROCODONE BIT-HOMATROP MBR 5-1.5 MG/5ML PO SOLN: 5.0000 mL | Freq: Four times a day (QID) | ORAL | 0 refills | Status: DC | PRN

## 2023-06-08 NOTE — Discharge Instructions (Signed)
Your covid swab was negative.  Ondansetron dissolved in the mouth every 8 hours as needed for nausea or vomiting.  Cough syrup hydrocodone bit-homatropine 5-1.5 mg/5 ml--Take 5 ml or 1 tsp by mouth every 6 hours as needed for cough. This can cause sleepiness or dizziness

## 2023-06-08 NOTE — ED Triage Notes (Signed)
[facility] presents to [facility] with need for covid test. Pt reports 3 people at work are positive and she is having ha and congestion. Pt has pmh of severe cobvid in past.

## 2023-06-08 NOTE — ED Provider Notes (Signed)
[name]    CSN: [id number] Arrival date & time: 06/08/23  1238      History   Chief Complaint Chief Complaint  Patient presents with   Headache    HPI Kara Cannon is a 52 y.o. female.    Headache Here for h/a and congestion. Symptoms began 7/13 in the evening. No f/c/shortness of breath. Has had some nausea, but no v/d.   PMH: DM, has had severe COVID in the past.  Past Medical History:  Diagnosis Date   Abnormal ultrasound 03/02/2017   Formatting of this note might be different from the original. 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   Headache(784.0) 06/22/2009   HEMORRHOIDS, INTERNAL 06/23/2003   Hyperlipidemia    HYPERTENSION 06/22/2009   Hypochromic microcytic anemia 08/07/2021   IC (interstitial cystitis)    Impingement syndrome of right shoulder region 07/13/2020   Iron deficiency anemia due to chronic blood loss 04/28/2022   Keratosis pilaris 08/07/2021   Left lower quadrant pain 02/25/2023   Left wrist pain 01/13/2022   MIGRAINE HEADACHE 06/22/2009   Morbid obesity (HCC) 02/25/2023   Obesity due to excess calories with serious comorbidity 11/17/2016   OSA  (obstructive sleep apnea) 03/29/2022   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   Pain in joint of right shoulder 11/07/2019   Perennial allergic rhinitis with seasonal variation 09/07/2019   PLANTAR WART, LEFT 08/03/2009   Postmenopausal bleeding 02/25/2023   Premature ovarian failure    Radial styloid tenosynovitis of left hand 05/06/2022   Raised TSH level 02/25/2023   Reactive airway disease without asthma 09/07/2019   Rectal bleeding 02/25/2023   Screening for malignant neoplasm of colon 02/25/2023   Tenosynovitis, wrist 05/06/2022   Type 2 diabetes mellitus with hyperglycemia (HCC) 04/05/2019   Unspecified vitamin D deficiency 06/22/2009   Urinary frequency 06/22/2009   UTI'S, HX OF 06/22/2009   Vomiting without nausea 02/25/2023    Patient Active Problem List   Diagnosis Date Noted   Respiratory failure (HCC) 06/21/2021   Pulmonary emboli (HCC) 06/20/2021   Bilateral pulmonary embolism (HCC)    Acute respiratory failure with hypoxia (HCC)    Pulmonary embolism (HCC) 06/19/2021   Premature ovarian failure 09/20/2016   IC (interstitial cystitis) 09/20/2016   Other specified hypothyroidism 09/22/2014   Back spasm 07/05/2013   Other abnormal glucose 04/05/2013   Mixed hyperlipidemia 04/05/2013   Generalized anxiety disorder 03/22/2013   PLANTAR WART, LEFT 08/03/2009   Vitamin D deficiency 06/22/2009   Migraine without aura 06/22/2009   EPISCLERITIS 06/22/2009   Benign essential hypertension 06/22/2009   INTERSTITIAL CYSTITIS 06/22/2009   EFFUSION OF ANKLE AND FOOT JOINT 06/22/2009   Cardiac murmur 06/22/2009   UTI'S, HX OF 06/22/2009  Personal history presenting hazards to health 06/22/2009   Hemorrhoids, internal 06/23/2003    Past Surgical History:  Procedure Laterality Date   CYSTOSCOPY  02/19/2009   Dr. Annabell Howells   FRACTURE SURGERY      OB History     Gravida  1   Para      Term      Preterm      AB  1   Living  0      SAB       IAB      Ectopic      Multiple      Live Births               Home Medications    Prior to Admission medications   Medication Sig Start Date End Date Taking? Authorizing Provider  HYDROcodone bit-homatropine (HYCODAN) 5-1.5 MG/5ML syrup Take 5 mLs by mouth every 6 (six) hours as needed for cough. 06/08/23  Yes Zenia Resides, MD  ondansetron (ZOFRAN-ODT) 4 MG disintegrating tablet Take 1 tablet (4 mg total) by mouth every 8 (eight) hours as needed for nausea or vomiting. 06/08/23  Yes Zenia Resides, MD  adapalene (DIFFERIN) 0.1 % gel Apply 1 Application topically at bedtime. 12/24/15   [provider]  albuterol (VENTOLIN HFA) 108 (90 Base) MCG/ACT inhaler Inhale into the lungs. 11/23/18   [provider]  Apple Cider Vinegar 500 MG TABS Take 1 tablet by mouth as needed.    [provider]  Aspirin-Acetaminophen-Caffeine (EXCEDRIN MIGRAINE PO) Excedrin Migraine    [provider]  aspirin-acetaminophen-caffeine (EXCEDRIN MIGRAINE) [phone] MG tablet Take by mouth.    [provider]  atorvastatin (LIPITOR) 40 MG tablet Take 1 tablet by mouth daily. 10/23/17   [provider]  cetirizine (ZYRTEC) 10 MG tablet Take by mouth.    [provider]  chlorhexidine (PERIDEX) 0.12 % solution (Prior Auth: Rx [id number]) Mouth/Throat for 16    [provider]  chlorpheniramine-HYDROcodone (TUSSIONEX) 10-8 MG/5ML Take 5mg  by mouth every 8 hrs or night time prn cough 08/30/22   [provider]  chlorzoxazone (PARAFON) 500 MG tablet Take by mouth.    [provider]  ciprofloxacin (CIPRO) 250 MG tablet (Prior Auth: Rx [id number]) Oral for 3    [provider]  Coenzyme Q10 300 MG CAPS Take by mouth.    [provider]  cyclobenzaprine (FLEXERIL) 5 MG tablet Take by mouth. 04/14/17   [provider]  dextromethorphan-guaiFENesin (MUCINEX DM) 30-600 MG 12hr tablet  Take 1 tablet by mouth 2 (two) times daily as needed for cough.    [provider]  diazepam (VALIUM) 10 MG tablet diazepam 10 mg tablet  TAKE 1 TABLET BY MOUTH AS DIRECTED 1 HOUR PRIOR TO MRI REPEAT IF NEEDED    [provider]  diclofenac (VOLTAREN) 50 MG EC tablet Take 1 tablet twice a day by oral route. 10/18/22   [provider]  diclofenac (VOLTAREN) 75 MG EC tablet Take 1 tablet by mouth 2 (two) times daily. 06/27/22   [provider]  diclofenac Sodium (PENNSAID) 2 % SOLN Pennsaid 20 mg/gram/actuation (2 %) topical soln in metered-dose pump  APPLY 2 PUMPS (40 MG) TO THE AFFECTED AREA BY TOPICAL ROUTE 2 TIMES PER DAY    [provider]  Diclofenac Sodium 1.5 % SOLN Please specify directions, refills and quantity    [provider]  diphenhydrAMINE (BENADRYL) 25 mg capsule Take by mouth. 06/11/21  [provider]  EPINEPHrine 0.3 mg/0.3 mL IJ SOAJ injection Inject 0.3 mg into the muscle as needed for anaphylaxis. 06/11/21   [provider]  Ergocalciferol (VITAMIN D2 PO) Vitamin D2 (obsolete)    [provider]  escitalopram (LEXAPRO) 10 MG tablet escitalopram 10 mg tablet  TAKE 1 TABLET BY MOUTH ONCE DAILY FOR 30 DAYS    [provider]  escitalopram (LEXAPRO) 20 MG tablet Take 20 mg by mouth daily. 01/03/21   [provider]  escitalopram (LEXAPRO) 20 MG tablet Take 1 tablet by mouth daily. 07/04/22   [provider]  Evening Primrose Oil 1000 MG CAPS Take 1 tablet by mouth every morning.    [provider]  famotidine (PEPCID) 40 MG tablet Take by mouth. 08/07/21   [provider]  ferrous sulfate 325 (65 FE) MG tablet Take 1 tablet (325 mg total) by mouth 2 (two) times daily with a meal. 06/22/21 07/22/21  Lanae Boast, MD  fexofenadine (ALLEGRA) 60 MG tablet Take by mouth. 08/07/21   [provider]  fexofenadine-pseudoephedrine (ALLEGRA-D) 60-120 MG 12 hr tablet  Take 1 tablet by mouth 2 (two) times daily.    [provider]  Flaxseed, Linseed, (FLAX SEED OIL) 1000 MG CAPS Take by mouth.    [provider]  gabapentin (NEURONTIN) 100 MG capsule gabapentin 100 mg capsule   2 capsules every day by oral route.    [provider]  glucose blood test strip Contour Next Test Strips  USE 1 STRIP TO CHECK GLUCOSE ONCE DAILY    [provider]  HYDRALAZINE-HCTZ PO HCTZ    [provider]  hydrochlorothiazide (HYDRODIURIL) 12.5 MG tablet (Prior Auth: Rx ZOX#:[id number]) Oral for 90    [provider]  hydrochlorothiazide (MICROZIDE) 12.5 MG capsule Take 1 tablet by mouth daily. 11/06/17   [provider]  ibuprofen (ADVIL) 800 MG tablet Take 800 mg by mouth 3 (three) times daily as needed for moderate pain. 02/04/21   [provider]  ibuprofen (ADVIL) 800 MG tablet ibuprofen 800 mg tablet    [provider]  levothyroxine (SYNTHROID) 25 MCG tablet Take 1 tablet by mouth daily.    [provider]  LEVOTHYROXINE SODIUM PO Levothyroxine    [provider]  liothyronine (CYTOMEL) 5 MCG tablet Take by mouth. 04/05/17   [provider]  losartan-hydrochlorothiazide (HYZAAR) 50-12.5 MG tablet losartan 50 mg-hydrochlorothiazide 12.5 mg tablet  TAKE 1 TABLET BY MOUTH EVERY DAY    [provider]  MAGNESIUM-OXIDE PO Take by mouth.    [provider]  meloxicam (MOBIC) 15 MG tablet Take 1 tablet by mouth daily.    [provider]  methocarbamol (ROBAXIN) 500 MG tablet methocarbamol 500 mg tablet    [provider]  METHYLCOBALAMIN PO Take 1 tablet by mouth every morning.    [provider]  Microlet Lancets MISC Microlet Lancet  USE 1 TO CHECK GLUCOSE ONCE DAILY    [provider]  mirabegron ER (MYRBETRIQ) 50 MG TB24 tablet (Prior Auth: Rx BJY#:[id number]) Oral for 90    [provider]  montelukast  (SINGULAIR) 10 MG tablet Take 10 mg by mouth daily. 06/15/21   [provider]  MULTIPLE VITAMINS PO Take 1 tablet by mouth as needed.    [provider]  Multiple Vitamins-Minerals (HAIR SKIN & NAILS ADVANCED PO) Hair, Skin & Nails    [provider]  MULTIPLE VITAMINS-MINERALS PO Take 1 tablet by mouth  as needed.    [provider]  naproxen sodium (ANAPROX) 550 MG tablet naproxen sodium 550 mg tablet  Please specify directions, refills and quantity    [provider]  nystatin-triamcinolone ointment (MYCOLOG) Apply topically. 07/04/22   [provider]  ondansetron (ZOFRAN) 4 MG tablet Take by mouth. 03/29/22   [provider]  phentermine 15 MG capsule Take by mouth.    [provider]  POTASSIUM PO Potassium    [provider]  pravastatin (PRAVACHOL) 10 MG tablet pravastatin 10 mg tablet  TAKE 1 TABLET BY MOUTH EVERY DAY    [provider]  pravastatin (PRAVACHOL) 20 MG tablet Take 20 mg by mouth at bedtime.  11/04/15   [provider]  PRAVASTATIN SODIUM PO pravastatin    [provider]  PROBIOTIC PRODUCT PO Take by mouth.    [provider]  pseudoephedrine (SUDAFED) 30 MG tablet Take 30 mg by mouth every 4 (four) hours as needed for congestion.    [provider]  Salicylic Acid (SALVAX) 6 % FOAM 1 application to affected area Externally once a day to one thigh 12/24/15   [provider]  Semaglutide, 1 MG/DOSE, (OZEMPIC, 1 MG/DOSE,) 4 MG/3ML SOPN Inject 1 mg into the skin once a week. 07/12/22   [provider]  sucralfate (CARAFATE) 1 g tablet Take by mouth. 08/07/21   [provider]  telmisartan (MICARDIS) 40 MG tablet Take 40 mg by mouth daily.    [provider]  telmisartan (MICARDIS) 40 MG tablet Take 1 tablet by mouth daily. 11/03/22   [provider]  telmisartan-hydrochlorothiazide (MICARDIS HCT) 40-12.5 MG tablet  Take 1 tablet by mouth daily.    [provider]  tiZANidine (ZANAFLEX) 4 MG tablet Take 1 tablet (4 mg total) by mouth every 6 (six) hours as needed for muscle spasms. 01/30/23   Carlisle Beers, FNP  traMADol (ULTRAM) 50 MG tablet Take by mouth. 05/24/21   [provider]  TRAMADOL HCL PO Tramadol    [provider]  traZODone (DESYREL) 50 MG tablet TAKE 1 TABLET BY MOUTH NIGHTLY. TAKE ONE HOUR PRIOR TO BEDTIME 09/30/22   [provider]  triamcinolone cream (KENALOG) 0.1 % APPLY TO AFFECTED AREA 2-3 TIMES DAILY 07/04/22   [provider]  TURMERIC PO Take 1 tablet by mouth as needed.    [provider]  UNABLE TO FIND Take by mouth. On Guard Plus    [provider]  UNABLE TO FIND Take by mouth. Blue Essentials Plus    [provider]  Vitamin D, Ergocalciferol, (DRISDOL) 1.25 MG (50000 UNIT) CAPS capsule (Prior Auth: Rx [id number]) Oral for 84 08/05/16   [provider]  dicyclomine (BENTYL) 20 MG tablet Take 1 tablet (20 mg total) by mouth every 6 (six) hours. Patient not taking: Reported on 12/17/2015 09/22/14 12/18/15  Glennie Isle L, DO  ipratropium (ATROVENT) 0.03 % nasal spray Place 2 sprays into the nose 4 (four) times daily. Patient not taking: Reported on 12/17/2015 11/28/13 12/18/15  Sherren Mocha, MD  potassium chloride (K-DUR) 10 MEQ tablet Take 1 tablet (10 mEq total) by mouth 2 (two) times daily. Patient not taking: Reported on 12/17/2015 05/15/13 12/18/15  Meda Coffee, DO    Family History Family History  Problem Relation Age of Onset   Skin cancer Maternal Uncle    Hypertension Mother    Hypertension Father    Ovarian cancer Paternal Grandmother  Cancer Paternal Grandmother    Diabetes Maternal Grandmother    Hyperlipidemia Maternal Grandmother     Social History Social History   Tobacco Use   Smoking status: Never   Smokeless tobacco: Never   Tobacco comments:    Married   Advertising account planner   Vaping status: Never Used  Substance Use Topics   Alcohol use: No   Drug use: No     Allergies   Corn-containing products, Naproxen sodium, Naproxen sodium, Sulfonamide derivatives, Sulfur, Ibuprofen, Glucose, and Penicillins   Review of Systems Review of Systems  Neurological:  Positive for headaches.     Physical Exam Triage Vital Signs ED Triage Vitals  Encounter Vitals Group     BP 06/08/23 1259 137/81     Systolic BP Percentile --      Diastolic BP Percentile --      Pulse Rate 06/08/23 1259 72     Resp 06/08/23 1259 16     Temp 06/08/23 1259 98.5 F (36.9 C)     Temp src --      SpO2 06/08/23 1259 98 %     Weight --      Height --      Head Circumference --      Peak Flow --      Pain Score 06/08/23 1258 2     Pain Loc --      Pain Education --      Exclude from Growth Chart --    No data found.  Updated Vital Signs BP 137/81   Pulse 72   Temp 98.5 F (36.9 C)   Resp 16   SpO2 98%   Visual Acuity Right Eye Distance:   Left Eye Distance:   Bilateral Distance:    Right Eye Near:   Left Eye Near:    Bilateral Near:     Physical Exam Vitals reviewed.  Constitutional:      General: She is not in acute distress.    Appearance: She is not ill-appearing, toxic-appearing or diaphoretic.  HENT:     Right Ear: Tympanic membrane and ear canal normal.     Left Ear: Tympanic membrane and ear canal normal.     Nose: Congestion present.     Mouth/Throat:     Mouth: Mucous membranes are moist.     Comments: There is mild erythema and clear exudate in the OP  Eyes:     Extraocular Movements: Extraocular movements intact.     Conjunctiva/sclera: Conjunctivae normal.     Pupils: Pupils are equal, round, and reactive to light.  Cardiovascular:     Rate and Rhythm: Normal rate and regular rhythm.     Heart sounds: No murmur heard. Pulmonary:     Effort: Pulmonary effort is normal. No respiratory distress.     Breath sounds: No stridor.  No wheezing, rhonchi or rales.  Musculoskeletal:     Cervical back: Neck supple.  Lymphadenopathy:     Cervical: No cervical adenopathy.  Skin:    Capillary Refill: Capillary refill takes less than 2 seconds.     Coloration: Skin is not jaundiced or pale.  Neurological:     General: No focal deficit present.     Mental Status: She is alert and oriented to person, place, and time.  Psychiatric:        Behavior: Behavior normal.      UC Treatments / Results  Labs (all labs ordered are listed, but only abnormal results are displayed) Labs  Reviewed  POC SARS CORONAVIRUS 2 AG -  ED    EKG   Radiology No results found.  Procedures Procedures (including critical care time)  Medications Ordered in UC Medications - No data to display  Initial Impression / Assessment and Plan / UC Course  I have reviewed the triage vital signs and the nursing notes.  Pertinent labs & imaging results that were available during my care of the patient were reviewed by me and considered in my medical decision making (see chart for details).       Last eGFR was >90 in April 2024, found in care everywhere tab.  Rapid COVID test is negative. We discussed doing a pcr, but since it would most likely not result till tomorrow, when she is on Day 6, we decided against it. Zofran sent in for the nausea. She states that tessalon perles do not work well for her; cough mainly bothering her at night. Final Clinical Impressions(s) / UC Diagnoses   Final diagnoses:  Viral upper respiratory tract infection   Discharge Instructions   None    ED Prescriptions     Medication Sig Dispense Auth. Provider   HYDROcodone bit-homatropine (HYCODAN) 5-1.5 MG/5ML syrup Take 5 mLs by mouth every 6 (six) hours as needed for cough. 120 mL Zenia Resides, MD   ondansetron (ZOFRAN-ODT) 4 MG disintegrating tablet Take 1 tablet (4 mg total) by mouth every 8 (eight) hours as needed for nausea or vomiting. 10 tablet  Marlinda Mike Janace Aris, MD      PDMP not reviewed this encounter.   Zenia Resides, MD 06/08/23 1330

## 2023-06-15 DIAGNOSIS — H35033 Hypertensive retinopathy, bilateral: Secondary | ICD-10-CM | POA: Diagnosis not present

## 2023-06-15 DIAGNOSIS — H524 Presbyopia: Secondary | ICD-10-CM | POA: Diagnosis not present

## 2023-06-15 DIAGNOSIS — H2513 Age-related nuclear cataract, bilateral: Secondary | ICD-10-CM | POA: Diagnosis not present

## 2023-06-15 DIAGNOSIS — H31092 Other chorioretinal scars, left eye: Secondary | ICD-10-CM | POA: Diagnosis not present

## 2023-06-15 DIAGNOSIS — R7309 Other abnormal glucose: Secondary | ICD-10-CM | POA: Diagnosis not present

## 2023-06-21 DIAGNOSIS — I2782 Chronic pulmonary embolism: Secondary | ICD-10-CM | POA: Diagnosis not present

## 2023-06-21 DIAGNOSIS — D500 Iron deficiency anemia secondary to blood loss (chronic): Secondary | ICD-10-CM | POA: Diagnosis not present

## 2023-06-25 IMAGING — DX DG ELBOW COMPLETE 3+V*L*
2 series · 2 of 2 positions shown · non-contrast
Comparison: None.

CLINICAL DATA: Recent fall with left elbow pain, initial encounter

EXAM:
LEFT ELBOW - COMPLETE 2 VIEW

[elbow ap]
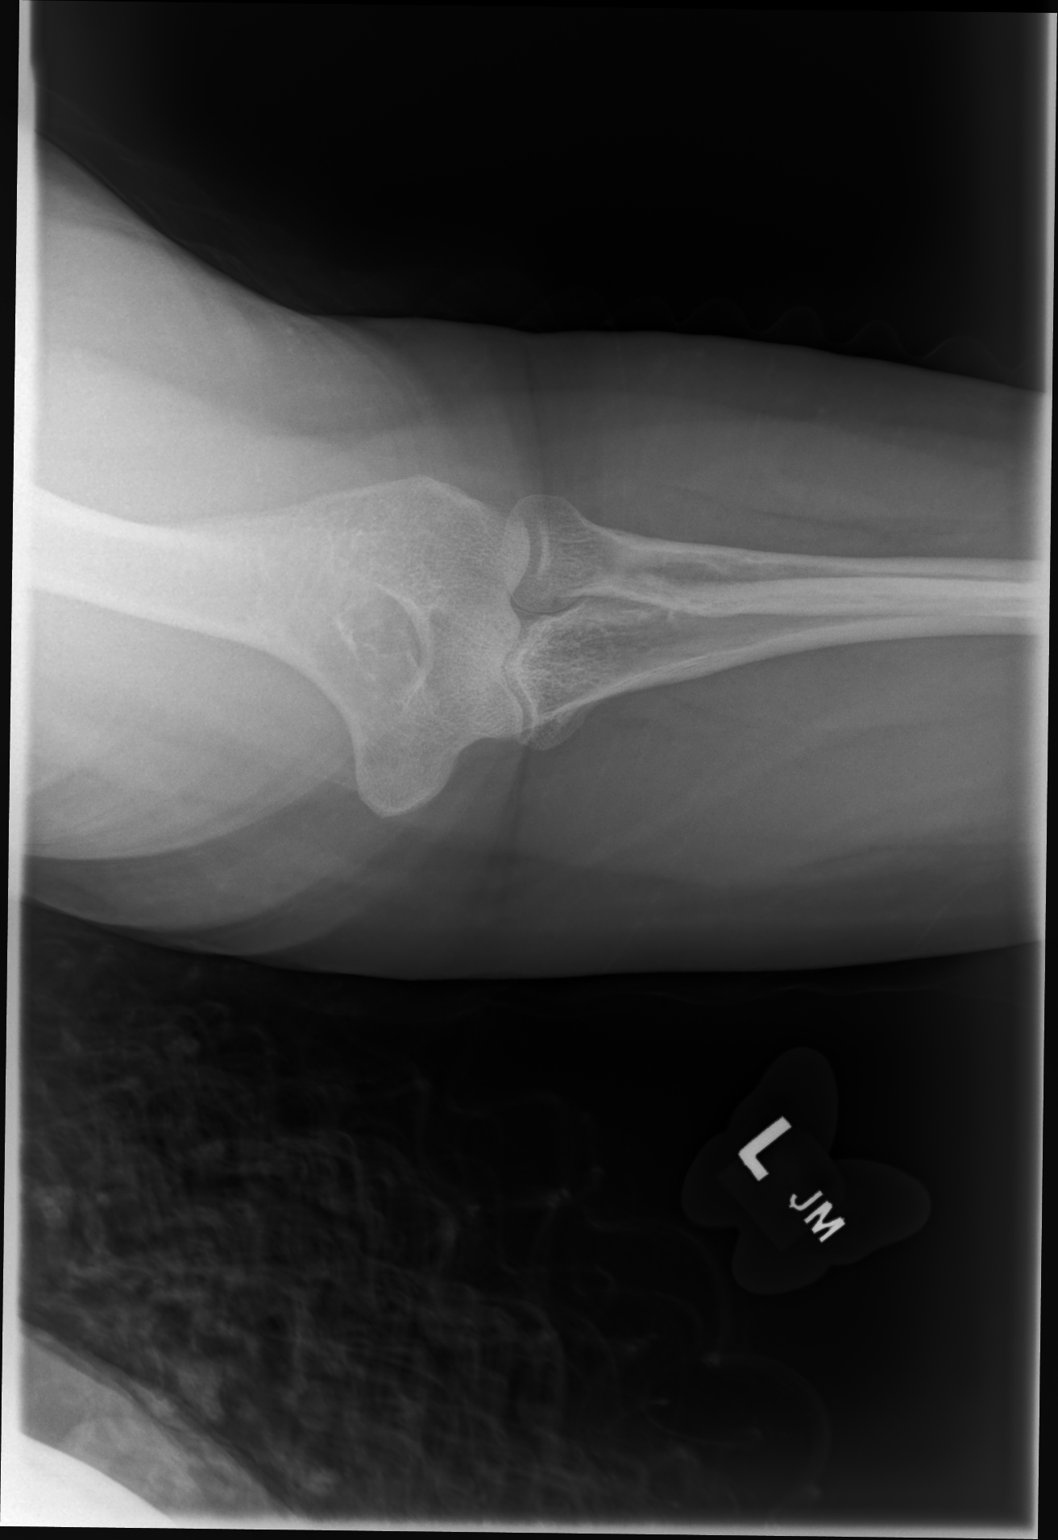

[elbow lat]
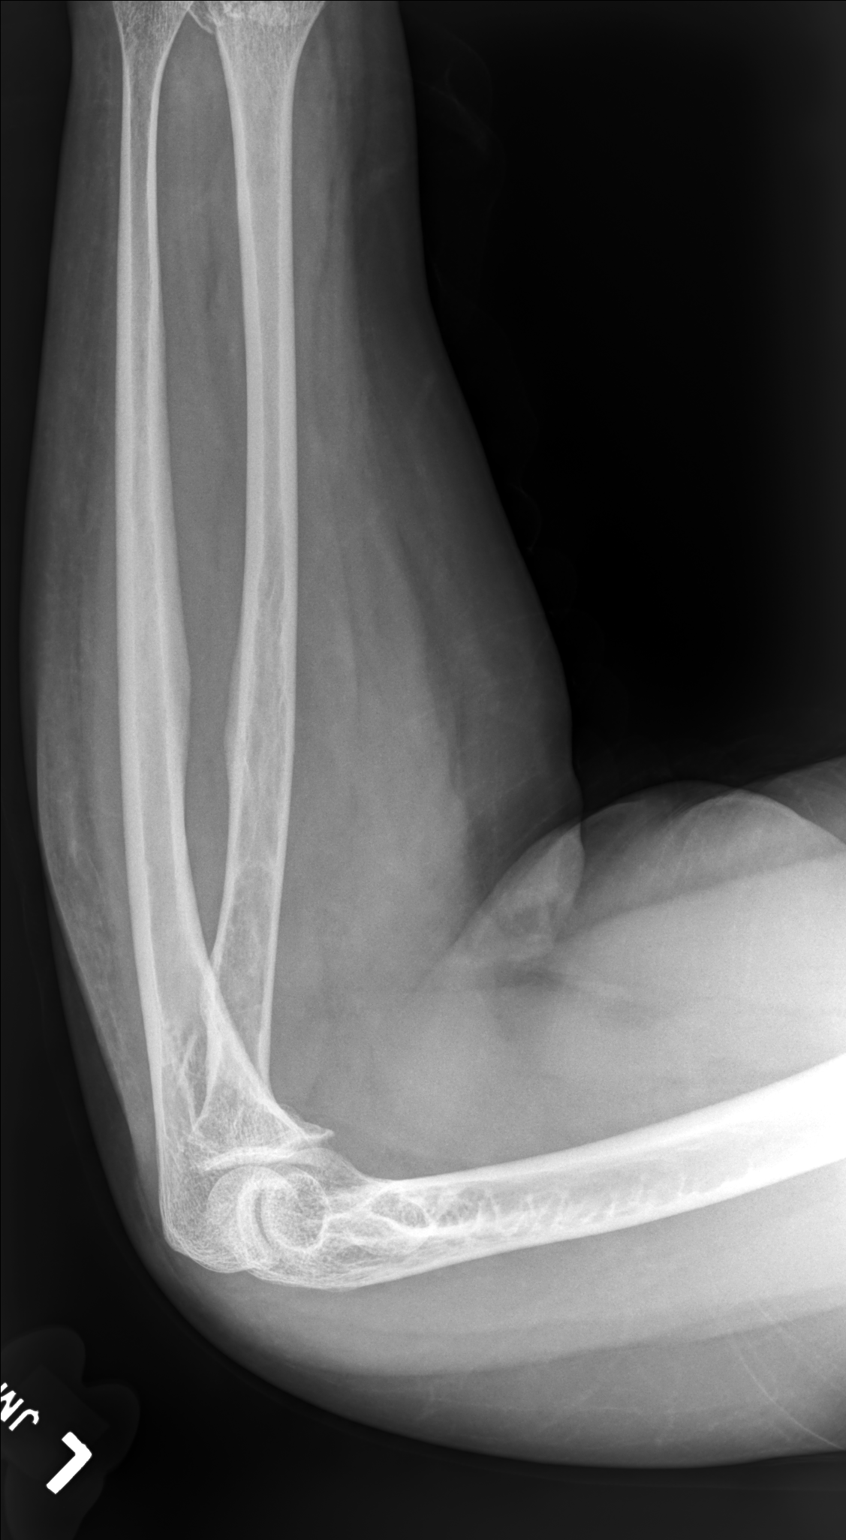

[2 of 2 positions shown; findings below may reference images not displayed]

FINDINGS: Degenerative changes are noted about left elbow joint. No acute
fracture or dislocation is noted. Minimal joint effusion is seen.
IMPRESSION: Degenerative change without acute bony abnormality.

## 2023-07-07 ENCOUNTER — Other Ambulatory Visit: Payer: Self-pay

## 2023-07-07 ENCOUNTER — Ambulatory Visit
Admission: RE | Admit: 2023-07-07 | Discharge: 2023-07-07 | Disposition: A | Discharge status: Home / Self Care | Payer: BC Managed Care – PPO | Source: Ambulatory Visit | Attending: Family Medicine | Admitting: Family Medicine

## 2023-07-07 VITALS — BP 168/84 | HR 88 | Temp 98.2°F | Resp 16

## 2023-07-07 DIAGNOSIS — T7840XA Allergy, unspecified, initial encounter: Secondary | ICD-10-CM | POA: Diagnosis not present

## 2023-07-07 MED ORDER — METHYLPREDNISOLONE ACETATE 80 MG/ML IJ SUSP
60.0000 mg | Freq: Once | INTRAMUSCULAR | Status: AC
  Administered 2023-07-07: 60 mg via INTRAMUSCULAR

## 2023-07-07 NOTE — ED Triage Notes (Signed)
C/o allergic reaction since yesterday; right side of face swollen. No difficulty swallowing.

## 2023-07-07 NOTE — ED Provider Notes (Signed)
[facility]    CSN: [id number] Arrival date & time: 07/07/23  0834      History   Chief Complaint Chief Complaint  Patient presents with   Allergic Reaction    Entered by patient    HPI Kara Cannon is a 52 y.o. female.   Yesterday patient began to experience itching around her eyes.  She then developed itching and mild swelling around her mouth, lips, and right face.  She took a Zyrtec tablet that resulted in mild improvement.  She denies difficulty swallowing, wheezing, shortness of breath, chest pain, etc.  She has a history of seasonal allergy to grass and weeks.  The history is provided by the patient.    Past Medical History:  Diagnosis Date   Abnormal ultrasound 03/02/2017   Formatting of this note might be different from the original. 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   Headache(784.0) 06/22/2009   HEMORRHOIDS, INTERNAL 06/23/2003   Hyperlipidemia    HYPERTENSION 06/22/2009   Hypochromic microcytic anemia 08/07/2021   IC (interstitial cystitis)    Impingement syndrome of right shoulder region 07/13/2020   Iron deficiency anemia due to chronic blood loss 04/28/2022    Keratosis pilaris 08/07/2021   Left lower quadrant pain 02/25/2023   Left wrist pain 01/13/2022   MIGRAINE HEADACHE 06/22/2009   Morbid obesity (HCC) 02/25/2023   Obesity due to excess calories with serious comorbidity 11/17/2016   OSA (obstructive sleep apnea) 03/29/2022   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   Pain in joint of right shoulder 11/07/2019   Perennial allergic rhinitis with seasonal variation 09/07/2019   PLANTAR WART, LEFT 08/03/2009   Postmenopausal bleeding 02/25/2023   Premature ovarian failure    Radial styloid tenosynovitis of left hand 05/06/2022   Raised TSH level 02/25/2023   Reactive airway disease without asthma 09/07/2019   Rectal bleeding 02/25/2023   Screening for malignant neoplasm of colon 02/25/2023   Tenosynovitis, wrist 05/06/2022   Type 2 diabetes mellitus with hyperglycemia (HCC) 04/05/2019   Unspecified vitamin D deficiency 06/22/2009   Urinary frequency 06/22/2009   UTI'S, HX OF 06/22/2009   Vomiting without nausea 02/25/2023    Patient Active Problem List   Diagnosis Date Noted   Respiratory failure (HCC) 06/21/2021   Pulmonary emboli (HCC) 06/20/2021   Bilateral pulmonary embolism (HCC)    Acute respiratory failure with hypoxia (HCC)    Pulmonary embolism (HCC) 06/19/2021   Premature ovarian failure 09/20/2016   IC (interstitial cystitis) 09/20/2016   Other specified hypothyroidism 09/22/2014   Back spasm 07/05/2013   Other abnormal glucose 04/05/2013   Mixed hyperlipidemia 04/05/2013   Generalized anxiety disorder 03/22/2013   PLANTAR WART, LEFT 08/03/2009   Vitamin D deficiency 06/22/2009  Migraine without aura 06/22/2009   EPISCLERITIS 06/22/2009   Benign essential hypertension 06/22/2009   INTERSTITIAL CYSTITIS 06/22/2009   EFFUSION OF ANKLE AND FOOT JOINT 06/22/2009   Cardiac murmur 06/22/2009   UTI'S, HX OF 06/22/2009   Personal history presenting hazards to health 06/22/2009   Hemorrhoids, internal  06/23/2003    Past Surgical History:  Procedure Laterality Date   CYSTOSCOPY  02/19/2009   Dr. Annabell Howells   FRACTURE SURGERY      OB History     Gravida  1   Para      Term      Preterm      AB  1   Living  0      SAB      IAB      Ectopic      Multiple      Live Births               Home Medications    Prior to Admission medications   Medication Sig Start Date End Date Taking? Authorizing Provider  adapalene (DIFFERIN) 0.1 % gel Apply 1 Application topically at bedtime. 12/24/15   [provider]  albuterol (VENTOLIN HFA) 108 (90 Base) MCG/ACT inhaler Inhale into the lungs. 11/23/18   [provider]  Apple Cider Vinegar 500 MG TABS Take 1 tablet by mouth as needed.    [provider]  Aspirin-Acetaminophen-Caffeine (EXCEDRIN MIGRAINE PO) Excedrin Migraine    [provider]  aspirin-acetaminophen-caffeine (EXCEDRIN MIGRAINE) [phone] MG tablet Take by mouth.    [provider]  atorvastatin (LIPITOR) 40 MG tablet Take 1 tablet by mouth daily. 10/23/17   [provider]  cetirizine (ZYRTEC) 10 MG tablet Take by mouth.    [provider]  chlorhexidine (PERIDEX) 0.12 % solution (Prior Auth: Rx [id number]) Mouth/Throat for 16    [provider]  chlorpheniramine-HYDROcodone (TUSSIONEX) 10-8 MG/5ML Take 5mg  by mouth every 8 hrs or night time prn cough 08/30/22   [provider]  chlorzoxazone (PARAFON) 500 MG tablet Take by mouth.    [provider]  ciprofloxacin (CIPRO) 250 MG tablet (Prior Auth: Rx [id number]) Oral for 3    [provider]  Coenzyme Q10 300 MG CAPS Take by mouth.    [provider]  cyclobenzaprine (FLEXERIL) 5 MG tablet Take by mouth. 04/14/17   [provider]  dextromethorphan-guaiFENesin (MUCINEX DM) 30-600 MG 12hr tablet Take 1 tablet by mouth 2 (two) times daily as needed for cough.    [provider]   diazepam (VALIUM) 10 MG tablet diazepam 10 mg tablet  TAKE 1 TABLET BY MOUTH AS DIRECTED 1 HOUR PRIOR TO MRI REPEAT IF NEEDED    [provider]  diclofenac (VOLTAREN) 50 MG EC tablet Take 1 tablet twice a day by oral route. 10/18/22   [provider]  diclofenac (VOLTAREN) 75 MG EC tablet Take 1 tablet by mouth 2 (two) times daily. 06/27/22   [provider]  diclofenac Sodium (PENNSAID) 2 % SOLN Pennsaid 20 mg/gram/actuation (2 %) topical soln in metered-dose pump  APPLY 2 PUMPS (40 MG) TO THE AFFECTED AREA BY TOPICAL ROUTE 2 TIMES PER DAY    [provider]  Diclofenac Sodium 1.5 % SOLN Please specify directions, refills and quantity    [provider]  diphenhydrAMINE (BENADRYL) 25 mg capsule Take by mouth. 06/11/21   [provider]  EPINEPHrine 0.3 mg/0.3 mL IJ SOAJ injection Inject 0.3 mg into the muscle  as needed for anaphylaxis. 06/11/21   [provider]  Ergocalciferol (VITAMIN D2 PO) Vitamin D2 (obsolete)    [provider]  escitalopram (LEXAPRO) 10 MG tablet escitalopram 10 mg tablet  TAKE 1 TABLET BY MOUTH ONCE DAILY FOR 30 DAYS    [provider]  escitalopram (LEXAPRO) 20 MG tablet Take 20 mg by mouth daily. 01/03/21   [provider]  escitalopram (LEXAPRO) 20 MG tablet Take 1 tablet by mouth daily. 07/04/22   [provider]  Evening Primrose Oil 1000 MG CAPS Take 1 tablet by mouth every morning.    [provider]  famotidine (PEPCID) 40 MG tablet Take by mouth. 08/07/21   [provider]  ferrous sulfate 325 (65 FE) MG tablet Take 1 tablet (325 mg total) by mouth 2 (two) times daily with a meal. 06/22/21 07/22/21  Lanae Boast, MD  fexofenadine (ALLEGRA) 60 MG tablet Take by mouth. 08/07/21   [provider]  fexofenadine-pseudoephedrine (ALLEGRA-D) 60-120 MG 12 hr tablet Take 1 tablet by mouth 2 (two) times daily.    [provider]  Flaxseed, Linseed,  (FLAX SEED OIL) 1000 MG CAPS Take by mouth.    [provider]  gabapentin (NEURONTIN) 100 MG capsule gabapentin 100 mg capsule   2 capsules every day by oral route.    [provider]  glucose blood test strip Contour Next Test Strips  USE 1 STRIP TO CHECK GLUCOSE ONCE DAILY    [provider]  HYDRALAZINE-HCTZ PO HCTZ    [provider]  hydrochlorothiazide (HYDRODIURIL) 12.5 MG tablet (Prior Auth: Rx ZOX#:[id number]) Oral for 90    [provider]  hydrochlorothiazide (MICROZIDE) 12.5 MG capsule Take 1 tablet by mouth daily. 11/06/17   [provider]  HYDROcodone bit-homatropine (HYCODAN) 5-1.5 MG/5ML syrup Take 5 mLs by mouth every 6 (six) hours as needed for cough. 06/08/23   Zenia Resides, MD  ibuprofen (ADVIL) 800 MG tablet Take 800 mg by mouth 3 (three) times daily as needed for moderate pain. 02/04/21   [provider]  ibuprofen (ADVIL) 800 MG tablet ibuprofen 800 mg tablet    [provider]  levothyroxine (SYNTHROID) 25 MCG tablet Take 1 tablet by mouth daily.    [provider]  LEVOTHYROXINE SODIUM PO Levothyroxine    [provider]  liothyronine (CYTOMEL) 5 MCG tablet Take by mouth. 04/05/17   [provider]  losartan-hydrochlorothiazide (HYZAAR) 50-12.5 MG tablet losartan 50 mg-hydrochlorothiazide 12.5 mg tablet  TAKE 1 TABLET BY MOUTH EVERY DAY    [provider]  MAGNESIUM-OXIDE PO Take by mouth.    [provider]  meloxicam (MOBIC) 15 MG tablet Take 1 tablet by mouth daily.    [provider]  methocarbamol (ROBAXIN) 500 MG tablet methocarbamol 500 mg tablet    [provider]  METHYLCOBALAMIN PO Take 1 tablet by mouth every morning.    [provider]  Microlet Lancets MISC Microlet Lancet  USE 1 TO CHECK GLUCOSE ONCE DAILY    [provider]  mirabegron ER (MYRBETRIQ) 50 MG TB24 tablet (Prior Auth: Rx  BJY#:[id number]) Oral for 90    [provider]  montelukast (SINGULAIR) 10 MG tablet Take 10 mg by mouth daily. 06/15/21   [provider]  MULTIPLE VITAMINS PO Take 1 tablet by mouth as needed.    [provider]  Multiple Vitamins-Minerals (HAIR SKIN & NAILS ADVANCED PO) Hair, Skin & Nails    [provider]  MULTIPLE VITAMINS-MINERALS PO Take 1 tablet by mouth as needed.    [provider]  naproxen sodium (ANAPROX) 550 MG tablet naproxen sodium 550 mg tablet  Please specify directions, refills and quantity    [provider]  nystatin-triamcinolone ointment (MYCOLOG) Apply topically. 07/04/22   [provider]  ondansetron (ZOFRAN) 4 MG tablet Take by mouth. 03/29/22   [provider]  ondansetron (ZOFRAN-ODT) 4 MG disintegrating tablet Take 1 tablet (4 mg total) by mouth every 8 (eight) hours as needed for nausea or vomiting. 06/08/23   Zenia Resides, MD  phentermine 15 MG capsule Take by mouth.    [provider]  POTASSIUM PO Potassium    [provider]  pravastatin (PRAVACHOL) 10 MG tablet pravastatin 10 mg tablet  TAKE 1 TABLET BY MOUTH EVERY DAY    [provider]  pravastatin (PRAVACHOL) 20 MG tablet Take 20 mg by mouth at bedtime.  11/04/15   [provider]  PRAVASTATIN SODIUM PO pravastatin    [provider]  PROBIOTIC PRODUCT PO Take by mouth.    [provider]  pseudoephedrine (SUDAFED) 30 MG tablet Take 30 mg by mouth every 4 (four) hours as needed for congestion.    [provider]  Salicylic Acid (SALVAX) 6 % FOAM 1 application to affected area Externally once a day to one thigh 12/24/15   [provider]  Semaglutide, 1 MG/DOSE, (OZEMPIC, 1 MG/DOSE,) 4 MG/3ML SOPN Inject 1 mg into the skin once a week. 07/12/22   [provider]  sucralfate (CARAFATE) 1 g tablet Take by mouth. 08/07/21   [provider]   telmisartan (MICARDIS) 40 MG tablet Take 40 mg by mouth daily.    [provider]  telmisartan (MICARDIS) 40 MG tablet Take 1 tablet by mouth daily. 11/03/22   [provider]  telmisartan-hydrochlorothiazide (MICARDIS HCT) 40-12.5 MG tablet Take 1 tablet by mouth daily.    [provider]  tiZANidine (ZANAFLEX) 4 MG tablet Take 1 tablet (4 mg total) by mouth every 6 (six) hours as needed for muscle spasms. 01/30/23   Carlisle Beers, FNP  traMADol (ULTRAM) 50 MG tablet Take by mouth. 05/24/21   [provider]  TRAMADOL HCL PO Tramadol    [provider]  traZODone (DESYREL) 50 MG tablet TAKE 1 TABLET BY MOUTH NIGHTLY. TAKE ONE HOUR PRIOR TO BEDTIME 09/30/22   [provider]  triamcinolone cream (KENALOG) 0.1 % APPLY TO AFFECTED AREA 2-3 TIMES DAILY 07/04/22   [provider]  TURMERIC PO Take 1 tablet by mouth as needed.    [provider]  UNABLE TO FIND Take by mouth. On Guard Plus    [provider]  UNABLE TO FIND Take by mouth. Blue Essentials Plus    [provider]  Vitamin D, Ergocalciferol, (DRISDOL) 1.25 MG (50000 UNIT) CAPS capsule (Prior Auth: Rx [id number]) Oral for 84 08/05/16   [provider]  dicyclomine (BENTYL) 20 MG tablet Take 1 tablet (20 mg total) by mouth every 6 (six) hours. Patient not taking: Reported on 12/17/2015 09/22/14 12/18/15  Glennie Isle L, DO  ipratropium (ATROVENT) 0.03 % nasal spray Place 2 sprays into the nose 4 (four) times daily. Patient not taking: Reported on 12/17/2015 11/28/13 12/18/15  Sherren Mocha, MD  potassium chloride (K-DUR) 10 MEQ tablet Take 1 tablet (10 mEq total) by mouth 2 (two) times daily. Patient not taking: Reported on 12/17/2015 05/15/13 12/18/15  Artist Pais, Doe-Hyun R, DO    Family History Family History  Problem Relation Age of Onset   Skin cancer Maternal Uncle    Hypertension Mother    Hypertension Father    Ovarian cancer  Paternal Grandmother    Cancer Paternal Grandmother    Diabetes Maternal Grandmother    Hyperlipidemia Maternal Grandmother     Social History Social History   Tobacco Use   Smoking status: Never   Smokeless tobacco: Never   Tobacco comments:    Married  Advertising account planner   Vaping status: Never Used  Substance Use Topics   Alcohol use: No   Drug use: No     Allergies   Naproxen sodium, Naproxen sodium, Sulfonamide derivatives, Sulfur, Ibuprofen, and Glucose   Review of Systems Review of Systems No sore throat No cough No pleuritic pain No wheezing No nasal congestion No post-nasal drainage No sinus pain/pressure + itchy eyes and mild swelling around mouth. No earache No hemoptysis No SOB No fever/chills No nausea No vomiting No abdominal pain No diarrhea No urinary symptoms No skin rash No fatigue No myalgias No headache   Physical Exam Triage Vital Signs ED Triage Vitals  Encounter Vitals Group     BP 07/07/23 0858 (!) 168/84     Systolic BP Percentile --      Diastolic BP Percentile --      Pulse Rate 07/07/23 0858 88     Resp 07/07/23 0858 16     Temp 07/07/23 0858 98.2 F (36.8 C)     Temp Source 07/07/23 0858 Oral     SpO2 07/07/23 0858 98 %     Weight --      Height --      Head Circumference --      Peak Flow --      Pain Score 07/07/23 0859 0     Pain Loc --      Pain Education --      Exclude from Growth Chart --    No data found.  Updated Vital Signs BP (!) 168/84 (BP Location: Right Arm)   Pulse 88   Temp 98.2 F (36.8 C) (Oral)   Resp 16   SpO2 98%   Visual Acuity Right Eye Distance:   Left Eye Distance:   Bilateral Distance:    Right Eye Near:   Left Eye Near:    Bilateral Near:     Physical Exam Vitals and nursing note reviewed.  Constitutional:      General: She is not in acute distress.    Appearance: She is not ill-appearing.  HENT:     Head: Normocephalic.      Comments: Minimal swelling (without tenderness)  around eyes and mouth.  No swelling of lips.    Right Ear: External ear normal.     Left Ear: External ear normal.     Nose: Nose normal.     Mouth/Throat:     Mouth: Mucous membranes are moist.     Pharynx: Oropharynx is clear.  Eyes:     Extraocular Movements: Extraocular movements intact.     Conjunctiva/sclera: Conjunctivae normal.     Pupils: Pupils are equal, round, and reactive to light.  Cardiovascular:     Rate and Rhythm: Normal rate and regular rhythm.     Heart sounds: Normal heart sounds.  Pulmonary:     Breath sounds: Normal breath sounds.  Musculoskeletal:     Cervical back: Neck supple.     Right  lower leg: No edema.     Left lower leg: No edema.  Lymphadenopathy:     Cervical: No cervical adenopathy.  Skin:    General: Skin is warm and dry.     Findings: No rash.  Neurological:     Mental Status: She is alert and oriented to person, place, and time.      UC Treatments / Results  Labs (all labs ordered are listed, but only abnormal results are displayed) Labs Reviewed - No data to display  EKG   Radiology No results found.  Procedures Procedures (including critical care time)  Medications Ordered in UC Medications  methylPREDNISolone acetate (DEPO-MEDROL) injection 60 mg (60 mg Intramuscular Given 07/07/23 0941)    Initial Impression / Assessment and Plan / UC Course  I have reviewed the triage vital signs and the nursing notes.  Pertinent labs & imaging results that were available during my care of the patient were reviewed by me and considered in my medical decision making (see chart for details).    Administered Depo Medrol 60mg  IM. Followup with Family Doctor if not improved in three days. Final Clinical Impressions(s) / UC Diagnoses   Final diagnoses:  Allergic reaction, initial encounter     Discharge Instructions      May continue Zyrtec 10mg  once daily until improved. Increase fluid intake.  If symptoms become significantly  worse during the night or over the weekend, proceed to the local emergency room.     ED Prescriptions   None       Lattie Haw, MD 07/09/23 1149

## 2023-07-07 NOTE — Discharge Instructions (Signed)
May continue Zyrtec 10mg  once daily until improved. Increase fluid intake.  If symptoms become significantly worse during the night or over the weekend, proceed to the local emergency room.

## 2023-08-09 DIAGNOSIS — R252 Cramp and spasm: Secondary | ICD-10-CM | POA: Diagnosis not present

## 2023-08-09 DIAGNOSIS — E782 Mixed hyperlipidemia: Secondary | ICD-10-CM | POA: Diagnosis not present

## 2023-08-09 DIAGNOSIS — I10 Essential (primary) hypertension: Secondary | ICD-10-CM | POA: Diagnosis not present

## 2023-08-09 DIAGNOSIS — F411 Generalized anxiety disorder: Secondary | ICD-10-CM | POA: Diagnosis not present

## 2023-08-09 DIAGNOSIS — I2782 Chronic pulmonary embolism: Secondary | ICD-10-CM | POA: Diagnosis not present

## 2023-08-09 DIAGNOSIS — E1165 Type 2 diabetes mellitus with hyperglycemia: Secondary | ICD-10-CM | POA: Diagnosis not present

## 2024-01-09 ENCOUNTER — Other Ambulatory Visit: Payer: Self-pay

## 2024-01-09 ENCOUNTER — Ambulatory Visit
Admission: RE | Admit: 2024-01-09 | Discharge: 2024-01-09 | Disposition: A | Discharge status: Home / Self Care | Payer: Self-pay | Source: Ambulatory Visit | Attending: Family Medicine | Admitting: Family Medicine

## 2024-01-09 VITALS — BP 153/87 | HR 81 | Temp 97.9°F | Resp 18

## 2024-01-09 DIAGNOSIS — R062 Wheezing: Secondary | ICD-10-CM

## 2024-01-09 DIAGNOSIS — J22 Unspecified acute lower respiratory infection: Secondary | ICD-10-CM

## 2024-01-09 MED ORDER — AMOXICILLIN 875 MG PO TABS
875.0000 mg | ORAL_TABLET | Freq: Two times a day (BID) | ORAL | 0 refills | Status: AC
Start: 2024-01-09 — End: 2024-01-19

## 2024-01-09 MED ORDER — PREDNISONE 20 MG PO TABS: 40.0000 mg | ORAL_TABLET | Freq: Every day | ORAL | 0 refills | Status: DC

## 2024-01-09 MED ORDER — ALBUTEROL SULFATE HFA 108 (90 BASE) MCG/ACT IN AERS
2.0000 | INHALATION_SPRAY | Freq: Four times a day (QID) | RESPIRATORY_TRACT | Status: DC | PRN
  Administered 2024-01-09: 2 via RESPIRATORY_TRACT

## 2024-01-09 NOTE — ED Provider Notes (Signed)
 [facility]    CSN: [id number] Arrival date & time: 01/09/24  1345      History   Chief Complaint Chief Complaint  Patient presents with   Cough    Entered by patient    HPI Kara Cannon is a 53 y.o. female.  Patient here today with a 2-week history of cough and wheezing.  Patient reports that her husband was recently sick and she feels she may have acquired upper respiratory illness from him.  Has not had a fever or flulike symptoms.  She has been taking over the medication without significant improvement.  She noticed yesterday that her lower back has been aching where she has been coughing.  Also has some chest tightness which is a humidifier to try to improve her work of breathing with minimal relief.  Has a history of asthma but however when she had COVID in the past she did develop asthma-like symptoms and required a albuterol inhaler for a while however has not had any recurrence of wheezing or shortness of breath since that time.  Denies any known sick contacts Past Medical History:  Diagnosis Date   Abnormal ultrasound 03/02/2017   Formatting of this note might be different from the original. 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/07/2019   Headache(784.0) 06/22/2009   HEMORRHOIDS, INTERNAL 06/23/2003   Hyperlipidemia    HYPERTENSION 06/22/2009   Hypochromic microcytic anemia 08/07/2021   IC (interstitial cystitis)    Impingement syndrome of right shoulder region 07/13/2020   Iron deficiency anemia due to chronic blood loss 04/28/2022   Keratosis pilaris 08/07/2021   Left lower quadrant pain 02/25/2023   Left wrist pain 01/13/2022   MIGRAINE HEADACHE 06/22/2009   Morbid obesity (HCC) 02/25/2023   Obesity due to excess calories with serious comorbidity 11/17/2016   OSA (obstructive sleep apnea) 03/29/2022   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   Pain in joint of right shoulder 11/07/2019   Perennial allergic rhinitis with seasonal variation 09/07/2019   PLANTAR WART, LEFT 08/03/2009   Postmenopausal bleeding 02/25/2023   Premature ovarian failure    Radial styloid tenosynovitis of left hand 05/06/2022   Raised TSH level 02/25/2023   Reactive airway disease without asthma 09/07/2019   Rectal bleeding 02/25/2023   Screening for malignant neoplasm of colon 02/25/2023   Tenosynovitis, wrist 05/06/2022   Type 2 diabetes mellitus with hyperglycemia (HCC) 04/05/2019   Unspecified vitamin D deficiency 06/22/2009   Urinary frequency 06/22/2009   UTI'S, HX OF 06/22/2009   Vomiting without nausea 02/25/2023    Patient Active Problem List   Diagnosis Date Noted   Respiratory failure (HCC) 06/21/2021   Pulmonary emboli (HCC) 06/20/2021   Bilateral pulmonary embolism (HCC)  Acute respiratory failure with hypoxia (HCC)    Pulmonary embolism (HCC) 06/19/2021   Premature ovarian failure 09/20/2016   IC (interstitial cystitis) 09/20/2016   Other specified hypothyroidism 09/22/2014   Back spasm 07/05/2013   Other abnormal glucose 04/05/2013   Mixed hyperlipidemia 04/05/2013   Generalized anxiety disorder 03/22/2013   PLANTAR WART, LEFT 08/03/2009    Vitamin D deficiency 06/22/2009   Migraine without aura 06/22/2009   EPISCLERITIS 06/22/2009   Benign essential hypertension 06/22/2009   INTERSTITIAL CYSTITIS 06/22/2009   EFFUSION OF ANKLE AND FOOT JOINT 06/22/2009   Cardiac murmur 06/22/2009   UTI'S, HX OF 06/22/2009   Personal history presenting hazards to health 06/22/2009   Hemorrhoids, internal 06/23/2003    Past Surgical History:  Procedure Laterality Date   CYSTOSCOPY  02/19/2009   Dr. Annabell Howells   FRACTURE SURGERY      OB History     Gravida  1   Para      Term      Preterm      AB  1   Living  0      SAB      IAB      Ectopic      Multiple      Live Births               Home Medications    Prior to Admission medications   Medication Sig Start Date End Date Taking? Authorizing Provider  amoxicillin (AMOXIL) 875 MG tablet Take 1 tablet (875 mg total) by mouth 2 (two) times daily for 10 days. 01/09/24 01/19/24 Yes Bing Neighbors, NP  predniSONE (DELTASONE) 20 MG tablet Take 2 tablets (40 mg total) by mouth daily with breakfast. 01/09/24  Yes Bing Neighbors, NP  atorvastatin (LIPITOR) 40 MG tablet Take 1 tablet by mouth daily. 10/23/17   [provider]  cetirizine (ZYRTEC) 10 MG tablet Take by mouth.    [provider]  dextromethorphan-guaiFENesin (MUCINEX DM) 30-600 MG 12hr tablet Take 1 tablet by mouth 2 (two) times daily as needed for cough.    [provider]  EPINEPHrine 0.3 mg/0.3 mL IJ SOAJ injection Inject 0.3 mg into the muscle as needed for anaphylaxis. 06/11/21   [provider]  famotidine (PEPCID) 40 MG tablet Take by mouth. 08/07/21   [provider]  ferrous sulfate 325 (65 FE) MG tablet Take 1 tablet (325 mg total) by mouth 2 (two) times daily with a meal. 06/22/21 07/22/21  Lanae Boast, MD  dicyclomine (BENTYL) 20 MG tablet Take 1 tablet (20 mg total) by mouth every 6 (six) hours. Patient not taking: Reported on 12/17/2015 09/22/14 12/18/15   Glennie Isle L, DO  ipratropium (ATROVENT) 0.03 % nasal spray Place 2 sprays into the nose 4 (four) times daily. Patient not taking: Reported on 12/17/2015 11/28/13 12/18/15  Sherren Mocha, MD  potassium chloride (K-DUR) 10 MEQ tablet Take 1 tablet (10 mEq total) by mouth 2 (two) times daily. Patient not taking: Reported on 12/17/2015 05/15/13 12/18/15  Meda Coffee, DO    Family History Family History  Problem Relation Age of Onset   Skin cancer Maternal Uncle    Hypertension Mother    Hypertension Father    Ovarian cancer Paternal Grandmother    Cancer Paternal Grandmother    Diabetes Maternal Grandmother    Hyperlipidemia Maternal Grandmother     Social History Social History   Tobacco Use   Smoking status: Never   Smokeless tobacco:  Never   Tobacco comments:    Married  Advertising account planner   Vaping status: Never Used  Substance Use Topics   Alcohol use: No   Drug use: No     Allergies   Naproxen sodium, Naproxen sodium, Sulfonamide derivatives, Sulfur, Ibuprofen, and Glucose   Review of Systems Review of Systems  Respiratory:  Positive for cough.      Physical Exam Triage Vital Signs ED Triage Vitals  Encounter Vitals Group     BP 01/09/24 1404 (!) 153/87     Systolic BP Percentile --      Diastolic BP Percentile --      Pulse Rate 01/09/24 1404 81     Resp 01/09/24 1404 18     Temp 01/09/24 1404 97.9 F (36.6 C)     Temp Source 01/09/24 1404 Oral     SpO2 01/09/24 1404 97 %     Weight --      Height --      Head Circumference --      Peak Flow --      Pain Score 01/09/24 1405 0     Pain Loc --      Pain Education --      Exclude from Growth Chart --    No data found.  Updated Vital Signs BP (!) 153/87 (BP Location: Right Arm)   Pulse 81   Temp 97.9 F (36.6 C) (Oral)   Resp 18   SpO2 97%   Visual Acuity Right Eye Distance:   Left Eye Distance:   Bilateral Distance:    Right Eye Near:   Left Eye Near:    Bilateral Near:     Physical  Exam Vitals and nursing note reviewed.  Constitutional:      Appearance: Normal appearance.  HENT:     Head: Normocephalic and atraumatic.     Nose: Congestion and rhinorrhea present.  Eyes:     Extraocular Movements: Extraocular movements intact.     Pupils: Pupils are equal, round, and reactive to light.  Cardiovascular:     Rate and Rhythm: Normal rate and regular rhythm.  Pulmonary:     Effort: Pulmonary effort is normal.     Breath sounds: Wheezing and rales present.  Skin:    General: Skin is warm and dry.  Neurological:     General: No focal deficit present.     Mental Status: She is alert.      UC Treatments / Results  Labs (all labs ordered are listed, but only abnormal results are displayed) Labs Reviewed - No data to display  EKG   Radiology No results found.  Procedures Procedures (including critical care time)  Medications Ordered in UC Medications  albuterol (VENTOLIN HFA) 108 (90 Base) MCG/ACT inhaler 2 puff (2 puffs Inhalation Given 01/09/24 1438)    Initial Impression / Assessment and Plan / UC Course  I have reviewed the triage vital signs and the nursing notes.  Pertinent labs & imaging results that were available during my care of the patient were reviewed by me and considered in my medical decision making (see chart for details).   Treating patient for lower respiratory infection with acute wheezing symptoms have been present for 2 weeks.  Imaging is not available onsite today therefore covering with a broad-spectrum antibiotic as patient has some exam findings concerning for possible pneumonia.  Prednisone 20 mg twice daily for 5 days to reduce chest inflammation which is likely contributing to his shortness of  breath and wheezing.  Patient can continue to use over-the-counter cough and cold medication to help control cough.  Return precautions given if any red flag symptoms develop. Final Clinical Impressions(s) / UC Diagnoses   Final  diagnoses:  Acute lower respiratory infection  Wheezing     Discharge Instructions      Follow respiratory infection.  Take medication as prescribed.  Albuterol inhaler 2 puffs every 4-6 hours for any wheezing or shortness of breath or chest tightness.  If you develop any severe difficulty breathing that does not improve with use of albuterol inhaler go immediately to the emergency department.  The antibiotics that you are being treated with is coverage for bacterial lower respiratory infections including bronchitis and pneumonia.  If you begin to develop a fever or any worsening of symptoms while taking this medication that would indicate the need to go to the emergency department for work and evaluation of your symptoms     ED Prescriptions     Medication Sig Dispense Auth. Provider   amoxicillin (AMOXIL) 875 MG tablet Take 1 tablet (875 mg total) by mouth 2 (two) times daily for 10 days. 20 tablet Bing Neighbors, NP   predniSONE (DELTASONE) 20 MG tablet Take 2 tablets (40 mg total) by mouth daily with breakfast. 10 tablet Bing Neighbors, NP      PDMP not reviewed this encounter.   Bing Neighbors, NP 01/09/24 1505

## 2024-01-09 NOTE — ED Triage Notes (Signed)
 Pt c/o cough and wheezing x 2 weeks with hx of PE after covid

## 2024-01-09 NOTE — Discharge Instructions (Signed)
 Follow respiratory infection.  Take medication as prescribed.  Albuterol inhaler 2 puffs every 4-6 hours for any wheezing or shortness of breath or chest tightness.  If you develop any severe difficulty breathing that does not improve with use of albuterol inhaler go immediately to the emergency department.  The antibiotics that you are being treated with is coverage for bacterial lower respiratory infections including bronchitis and pneumonia.  If you begin to develop a fever or any worsening of symptoms while taking this medication that would indicate the need to go to the emergency department for work and evaluation of your symptoms

## 2024-01-18 ENCOUNTER — Other Ambulatory Visit: Payer: Self-pay

## 2024-01-18 ENCOUNTER — Encounter: Payer: Self-pay | Admitting: Family Medicine

## 2024-01-18 ENCOUNTER — Ambulatory Visit
Admission: EM | Admit: 2024-01-18 | Discharge: 2024-01-18 | Disposition: A | Discharge status: Home / Self Care | Payer: Self-pay | Attending: Family Medicine | Admitting: Family Medicine

## 2024-01-18 ENCOUNTER — Ambulatory Visit (INDEPENDENT_AMBULATORY_CARE_PROVIDER_SITE_OTHER): Payer: Self-pay

## 2024-01-18 DIAGNOSIS — R058 Other specified cough: Secondary | ICD-10-CM

## 2024-01-18 DIAGNOSIS — M62830 Muscle spasm of back: Secondary | ICD-10-CM

## 2024-01-18 MED ORDER — ALBUTEROL SULFATE HFA 108 (90 BASE) MCG/ACT IN AERS
2.0000 | INHALATION_SPRAY | Freq: Once | RESPIRATORY_TRACT | Status: AC
  Administered 2024-01-18: 2 via RESPIRATORY_TRACT

## 2024-01-18 MED ORDER — INDOMETHACIN 50 MG PO CAPS: 50.0000 mg | ORAL_CAPSULE | Freq: Two times a day (BID) | ORAL | 0 refills | Status: DC

## 2024-01-18 MED ORDER — DICLOFENAC SODIUM 1 % EX GEL: 4.0000 g | Freq: Four times a day (QID) | CUTANEOUS | 0 refills | Status: DC

## 2024-01-18 MED ORDER — TIZANIDINE HCL 4 MG PO TABS: 4.0000 mg | ORAL_TABLET | Freq: Four times a day (QID) | ORAL | 0 refills | Status: DC | PRN

## 2024-01-18 MED ORDER — KETOROLAC TROMETHAMINE 30 MG/ML IJ SOLN
30.0000 mg | Freq: Once | INTRAMUSCULAR | Status: AC
  Administered 2024-01-18: 30 mg via INTRAMUSCULAR

## 2024-01-18 MED ORDER — DEXAMETHASONE SODIUM PHOSPHATE 10 MG/ML IJ SOLN
10.0000 mg | Freq: Once | INTRAMUSCULAR | Status: AC
  Administered 2024-01-18: 10 mg via INTRAMUSCULAR

## 2024-01-18 NOTE — ED Provider Notes (Signed)
 [facility]    CSN: [id number] Arrival date & time: 01/18/24  0844      History   Chief Complaint Chief Complaint  Patient presents with   Back Pain    HPI Kara Cannon is a 53 y.o. female.  Patient presents today with concern fr mid back pain and persistent cough. Patient seen here in clinic on 01/09/2024, treated for lower respiratory infection with Augmentin and prednisone. Patient reports most symptoms have improved but the cough remains presents and is aggravating mid back pain. She endorses some increased work of breathing and wheezing, although has no history of asthma. Denies fever.   Past Medical History:  Diagnosis Date   Abnormal ultrasound 03/02/2017   Formatting of this note might be different from the original. 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   Headache(784.0) 06/22/2009   HEMORRHOIDS, INTERNAL 06/23/2003   Hyperlipidemia    HYPERTENSION 06/22/2009   Hypochromic microcytic anemia 08/07/2021   IC (interstitial cystitis)    Impingement syndrome of right shoulder region 07/13/2020   Iron deficiency anemia due to chronic blood loss 04/28/2022   Keratosis pilaris  08/07/2021   Left lower quadrant pain 02/25/2023   Left wrist pain 01/13/2022   MIGRAINE HEADACHE 06/22/2009   Morbid obesity (HCC) 02/25/2023   Obesity due to excess calories with serious comorbidity 11/17/2016   OSA (obstructive sleep apnea) 03/29/2022   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   Pain in joint of right shoulder 11/07/2019   Perennial allergic rhinitis with seasonal variation 09/07/2019   PLANTAR WART, LEFT 08/03/2009   Postmenopausal bleeding 02/25/2023   Premature ovarian failure    Radial styloid tenosynovitis of left hand 05/06/2022   Raised TSH level 02/25/2023   Reactive airway disease without asthma 09/07/2019   Rectal bleeding 02/25/2023   Screening for malignant neoplasm of colon 02/25/2023   Tenosynovitis, wrist 05/06/2022   Type 2 diabetes mellitus with hyperglycemia (HCC) 04/05/2019   Unspecified vitamin D deficiency 06/22/2009   Urinary frequency 06/22/2009   UTI'S, HX OF 06/22/2009   Vomiting without nausea 02/25/2023    Patient Active Problem List   Diagnosis Date Noted   Respiratory failure (HCC) 06/21/2021   Pulmonary emboli (HCC) 06/20/2021   Bilateral pulmonary embolism (HCC)    Acute respiratory failure with hypoxia (HCC)    Pulmonary embolism (HCC) 06/19/2021   Premature ovarian failure 09/20/2016   IC (interstitial cystitis) 09/20/2016   Other specified hypothyroidism 09/22/2014   Back spasm 07/05/2013   Other abnormal glucose 04/05/2013   Mixed hyperlipidemia 04/05/2013   Generalized anxiety disorder 03/22/2013   PLANTAR WART, LEFT 08/03/2009   Vitamin D deficiency 06/22/2009   Migraine without aura 06/22/2009   EPISCLERITIS 06/22/2009   Benign essential  hypertension 06/22/2009   INTERSTITIAL CYSTITIS 06/22/2009   EFFUSION OF ANKLE AND FOOT JOINT 06/22/2009   Cardiac murmur 06/22/2009   UTI'S, HX OF 06/22/2009   Personal history presenting hazards to health 06/22/2009   Hemorrhoids, internal 06/23/2003     Past Surgical History:  Procedure Laterality Date   CYSTOSCOPY  02/19/2009   Dr. Annabell Howells   FRACTURE SURGERY      OB History     Gravida  1   Para      Term      Preterm      AB  1   Living  0      SAB      IAB      Ectopic      Multiple      Live Births               Home Medications    Prior to Admission medications   Medication Sig Start Date End Date Taking? Authorizing Provider  atorvastatin (LIPITOR) 40 MG tablet Take 1 tablet by mouth daily. 10/23/17  Yes [provider]  cetirizine (ZYRTEC) 10 MG tablet Take by mouth.   Yes [provider]  escitalopram (LEXAPRO) 10 MG tablet Take 1 tablet by mouth daily. 08/09/23 08/08/24 Yes [provider]  ferrous sulfate 325 (65 FE) MG tablet Take 1 tablet (325 mg total) by mouth 2 (two) times daily with a meal. 06/22/21 01/18/24 Yes Kc, Dayna Barker, MD  hydrOXYzine (ATARAX) 25 MG tablet Take by mouth. 05/11/23  Yes [provider]  indomethacin (INDOCIN) 50 MG capsule Take 1 capsule (50 mg total) by mouth 2 (two) times daily with a meal. 01/18/24  Yes Bing Neighbors, NP  methocarbamol (ROBAXIN) 500 MG tablet Take 500 mg by mouth 3 (three) times daily. 08/09/23  Yes [provider]  rosuvastatin (CRESTOR) 5 MG tablet Take 5 mg by mouth daily.   Yes [provider]  telmisartan (MICARDIS) 80 MG tablet Take 1 tablet by mouth daily. 08/09/23  Yes [provider]  traZODone (DESYREL) 50 MG tablet Take 50 mg by mouth at bedtime as needed. 11/07/23  Yes [provider]  dextromethorphan-guaiFENesin (MUCINEX DM) 30-600 MG 12hr tablet Take 1 tablet by mouth 2 (two) times daily as needed for cough. Patient not taking: Reported on 01/18/2024    [provider]  EPINEPHrine 0.3 mg/0.3 mL IJ SOAJ injection Inject 0.3 mg into the muscle as needed for anaphylaxis. 06/11/21   [provider]  famotidine (PEPCID) 40 MG tablet Take by mouth. Patient  not taking: Reported on 01/18/2024 08/07/21   [provider]  predniSONE (DELTASONE) 20 MG tablet Take 2 tablets (40 mg total) by mouth daily with breakfast. Patient not taking: Reported on 01/18/2024 01/09/24   Bing Neighbors, NP  tiZANidine (ZANAFLEX) 4 MG tablet Take 1 tablet (4 mg total) by mouth every 6 (six) hours as needed. 01/18/24   Bing Neighbors, NP  dicyclomine (BENTYL) 20 MG tablet Take 1 tablet (20 mg total) by mouth every 6 (six) hours. Patient not taking: Reported on 12/17/2015 09/22/14 12/18/15  Glennie Isle L, DO  ipratropium (ATROVENT) 0.03 % nasal spray Place 2 sprays into the nose 4 (four) times daily. Patient not taking: Reported on 12/17/2015 11/28/13 12/18/15  Sherren Mocha, MD  potassium chloride (K-DUR) 10 MEQ tablet Take 1 tablet (10 mEq total) by mouth 2 (two) times daily. Patient not taking: Reported on 12/17/2015 05/15/13 12/18/15  Artist Pais, Doe-Hyun  R, DO    Family History Family History  Problem Relation Age of Onset   Skin cancer Maternal Uncle    Hypertension Mother    Hypertension Father    Ovarian cancer Paternal Grandmother    Cancer Paternal Grandmother    Diabetes Maternal Grandmother    Hyperlipidemia Maternal Grandmother     Social History Social History   Tobacco Use   Smoking status: Never   Smokeless tobacco: Never   Tobacco comments:    Married  Advertising account planner   Vaping status: Never Used  Substance Use Topics   Alcohol use: No   Drug use: No     Allergies   Sulfonamide derivatives, Sulfur, Ibuprofen, and Glucose   Review of Systems Review of Systems  Respiratory:  Positive for cough and shortness of breath.   Musculoskeletal:  Positive for back pain.     Physical Exam Triage Vital Signs ED Triage Vitals  Encounter Vitals Group     BP 01/18/24 0902 126/86     Systolic BP Percentile --      Diastolic BP Percentile --      Pulse Rate 01/18/24 0902 91     Resp 01/18/24 0902 20     Temp 01/18/24 0902 98.8 F (37.1 C)      Temp Source 01/18/24 0902 Oral     SpO2 01/18/24 0902 95 %     Weight --      Height --      Head Circumference --      Peak Flow --      Pain Score 01/18/24 0923 6     Pain Loc --      Pain Education --      Exclude from Growth Chart --    No data found.  Updated Vital Signs BP 126/86 (BP Location: Left Arm)   Pulse 91   Temp 98.8 F (37.1 C) (Oral)   Resp 20   SpO2 95%   Visual Acuity Right Eye Distance:   Left Eye Distance:   Bilateral Distance:    Right Eye Near:   Left Eye Near:    Bilateral Near:     Physical Exam Vitals reviewed.  Constitutional:      Appearance: Normal appearance. She is not ill-appearing or toxic-appearing.  HENT:     Nose: Nose normal. No congestion or rhinorrhea.  Eyes:     Extraocular Movements: Extraocular movements intact.     Conjunctiva/sclera: Conjunctivae normal.     Pupils: Pupils are equal, round, and reactive to light.  Cardiovascular:     Rate and Rhythm: Normal rate and regular rhythm.  Pulmonary:     Breath sounds: Wheezing and rhonchi present.  Musculoskeletal:     Cervical back: Normal range of motion and neck supple.       Back:  Skin:    General: Skin is warm and dry.  Neurological:     General: No focal deficit present.     Mental Status: She is alert.      UC Treatments / Results  Labs (all labs ordered are listed, but only abnormal results are displayed) Labs Reviewed - No data to display  EKG   Radiology No results found.  Procedures Procedures (including critical care time)  Medications Ordered in UC Medications  dexamethasone (DECADRON) injection 10 mg (10 mg Intramuscular Given 01/18/24 1038)  albuterol (VENTOLIN HFA) 108 (90 Base) MCG/ACT inhaler 2 puff (2 puffs Inhalation Given 01/18/24 1040)  ketorolac (TORADOL) 30 MG/ML  injection 30 mg (30 mg Intramuscular Given 01/18/24 1036)    Initial Impression / Assessment and Plan / UC Course  I have reviewed the triage vital signs and the  nursing notes.  Pertinent labs & imaging results that were available during my care of the patient were reviewed by me and considered in my medical decision making (see chart for details).    Cough >3 weeks, no acute abnormalities seen on chest-x-ray, current cough with wheezing likely prolonged course of bronchitis vs reactive airway Decadron 10 mg IM given today and albuterol inhaler 2 puffs every 4-6 hours as needed cough and wheezing.  Acute mid thoracic pain, tizanidine 4 mg every 6 hours as needed. Patient has multiple NSAID allergies listed, however patient endorses that she taken Ibuprofen and Aleve without difficulty and had previously asked these allergies to be removed.  Allergies removed and patient is being treated with Indomethacin twice daily PRN. Toradol given here today for acute back pain. Advised to wait at least 8 hours before taking any NSAID since Toradol given. Return precautions given. Final Clinical Impressions(s) / UC Diagnoses   Final diagnoses:  Cough present for greater than 3 weeks  Spasm of thoracic back muscle     Discharge Instructions      -Take Indomethacin 50 mg twice dally for 7 days then as needed for back pain.  -Tizanidine 4 mg every 6 hours as needed for pain.  -Resume Albuterol inhaler every 4-6 hours as needed for shortness of breath or wheezing/chest tightness  -Apply heating pad to back for pain.       ED Prescriptions     Medication Sig Dispense Auth. Provider   tiZANidine (ZANAFLEX) 4 MG tablet Take 1 tablet (4 mg total) by mouth every 6 (six) hours as needed. 30 tablet Bing Neighbors, NP   diclofenac Sodium (VOLTAREN) 1 % GEL  (Status: Discontinued) Apply 4 g topically 4 (four) times daily. 100 g Bing Neighbors, NP   indomethacin (INDOCIN) 50 MG capsule Take 1 capsule (50 mg total) by mouth 2 (two) times daily with a meal. 30 capsule Bing Neighbors, NP      PDMP not reviewed this encounter.   Bing Neighbors,  NP 01/21/24 [phone]

## 2024-01-18 NOTE — ED Triage Notes (Signed)
 Pt reports she was seen here on 2/18 for cough. States still having cough but also having back pain which is getting worse

## 2024-01-18 NOTE — Discharge Instructions (Addendum)
-  Take Indomethacin 50 mg twice dally for 7 days then as needed for back pain.  -Tizanidine 4 mg every 6 hours as needed for pain.  -Resume Albuterol inhaler every 4-6 hours as needed for shortness of breath or wheezing/chest tightness  -Apply heating pad to back for pain.

## 2024-01-19 ENCOUNTER — Ambulatory Visit: Payer: Self-pay

## 2024-05-24 DIAGNOSIS — K047 Periapical abscess without sinus: Secondary | ICD-10-CM | POA: Diagnosis not present

## 2024-06-03 NOTE — Progress Notes (Unsigned)
 New patient visit   Patient: Kara Cannon   DOB: Oct 20, 1971   53 y.o. Female  MRN: [id number] Visit Date: 06/04/2024  Today's healthcare provider: Manuelita Flatness, PA-C   No chief complaint on file.  Subjective    Kara Cannon is a 53 y.o. female who presents today as a new patient to establish care.   ***  Past Medical History:  Diagnosis Date   Abnormal ultrasound 03/02/2017   Formatting of this note might be different from the original. 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   Headache(784.0) 06/22/2009   HEMORRHOIDS, INTERNAL 06/23/2003   Hyperlipidemia    HYPERTENSION 06/22/2009   Hypochromic microcytic anemia 08/07/2021   IC (interstitial cystitis)    Impingement syndrome of right shoulder region 07/13/2020   Iron deficiency anemia due to chronic blood loss 04/28/2022   Keratosis pilaris 08/07/2021   Left lower quadrant pain 02/25/2023   Left wrist pain 01/13/2022   MIGRAINE HEADACHE 06/22/2009   Morbid obesity (HCC) 02/25/2023   Obesity due to excess calories with serious comorbidity 11/17/2016   OSA (obstructive sleep apnea) 03/29/2022   Osteoarthritis of right acromioclavicular  joint 11/08/2019   Pain in joint of right shoulder 11/07/2019   Perennial allergic rhinitis with seasonal variation 09/07/2019   PLANTAR WART, LEFT 08/03/2009   Postmenopausal bleeding 02/25/2023   Premature ovarian failure    Radial styloid tenosynovitis of left hand 05/06/2022   Raised TSH level 02/25/2023   Reactive airway disease without asthma 09/07/2019   Rectal bleeding 02/25/2023   Screening for malignant neoplasm of colon 02/25/2023   Tenosynovitis, wrist 05/06/2022   Type 2 diabetes mellitus with hyperglycemia (HCC) 04/05/2019   Unspecified vitamin D deficiency 06/22/2009   Urinary frequency 06/22/2009   UTI'S, HX OF 06/22/2009   Vomiting without nausea 02/25/2023   Past Surgical History:  Procedure Laterality Date   CYSTOSCOPY  02/19/2009   Dr. Watt   FRACTURE SURGERY     Family Status  Relation Name Status   Mother  Alive   Father  Alive   PGM  Deceased   MGM  Alive   Sister  Alive   Brother  Alive   MGF  Deceased   PGF  Deceased   Nurse, mental health  (Not Specified)  No partnership data on file   Family History  Problem Relation Age of Onset   Skin cancer Maternal Uncle    Hypertension Mother    Hypertension Father    Ovarian cancer Paternal Grandmother    Cancer Paternal Grandmother    Diabetes Maternal Grandmother    Hyperlipidemia Maternal Grandmother    Social History   Socioeconomic History   Marital status: Married    Spouse name: Not on  file   Number of children: Not on file   Years of education: Not on file   Highest education level: Not on file  Occupational History   Occupation: New Database administrator    Employer: [employer]    Comment: Printmaker  Tobacco Use   Smoking status: Never   Smokeless tobacco: Never   Tobacco comments:    Married  Advertising account planner   Vaping status: Never Used  Substance and Sexual Activity   Alcohol use: No   Drug use: No   Sexual activity: Yes    Birth control/protection: Post-menopausal  Other  Topics Concern   Not on file  Social History Narrative   Not on file   Social Drivers of Health   Financial Resource Strain: Not on file  Food Insecurity: Medium Risk (08/08/2023)   Received from [facility]   Hunger Vital Sign    Worried About Running Out of Food in the Last Year: Sometimes true    Ran Out of Food in the Last Year: Sometimes true  Transportation Needs: No Transportation Needs (08/08/2023)   Received from [facility]    In the past 12 months, has lack of reliable transportation kept you from medical appointments, meetings, work or from getting things needed for daily living? : No  Physical Activity: Not on file  Stress: Not on file  Social Connections: Not on file   Outpatient Medications Prior to Visit  Medication Sig   atorvastatin (LIPITOR) 40 MG tablet Take 1 tablet by mouth daily.   cetirizine (ZYRTEC) 10 MG tablet Take by mouth.   dextromethorphan -guaiFENesin  (MUCINEX  DM) 30-600 MG 12hr tablet Take 1 tablet by mouth 2 (two) times daily as needed for cough. (Patient not taking: Reported on 01/18/2024)   EPINEPHrine  0.3 mg/0.3 mL IJ SOAJ injection Inject 0.3 mg into the muscle as needed for anaphylaxis.   escitalopram  (LEXAPRO ) 10 MG tablet Take 1 tablet by mouth daily.   famotidine  (PEPCID ) 40 MG tablet Take by mouth. (Patient not taking: Reported on 01/18/2024)   ferrous sulfate  325 (65 FE) MG tablet Take 1 tablet (325 mg total) by mouth 2 (two) times daily with a meal.   hydrOXYzine  (ATARAX ) 25 MG tablet Take by mouth.   indomethacin  (INDOCIN ) 50 MG capsule Take 1 capsule (50 mg total) by mouth 2 (two) times daily with a meal.   methocarbamol (ROBAXIN) 500 MG tablet Take 500 mg by mouth 3 (three) times daily.   predniSONE  (DELTASONE ) 20 MG tablet Take 2 tablets (40 mg total) by mouth daily with breakfast. (Patient not taking: Reported on 01/18/2024)   rosuvastatin  (CRESTOR ) 5 MG tablet Take 5 mg by mouth daily.   telmisartan (MICARDIS) 80 MG  tablet Take 1 tablet by mouth daily.   tiZANidine  (ZANAFLEX ) 4 MG tablet Take 1 tablet (4 mg total) by mouth every 6 (six) hours as needed.   traZODone (DESYREL) 50 MG tablet Take 50 mg by mouth at bedtime as needed.   No facility-administered medications prior to visit.   Allergies  Allergen Reactions   Sulfonamide Derivatives Other (See Comments), Hives, Itching and Swelling    Generalized  Other Reaction(s): swelling and rash   Sulfur Swelling, Hives, Itching and Other (See Comments)   Ibuprofen  Other (See Comments) and Swelling    Generalized   Glucose Other (See Comments)    Immunization History  Administered Date(s) Administered   Moderna Covid-19 Fall Seasonal Vaccine 37yrs & older 07/21/2023   PFIZER Comirnaty(Gray Top)Covid-19  Tri-Sucrose Vaccine 02/01/2020, 02/25/2020   PFIZER(Purple Top)SARS-COV-2 Vaccination 09/17/2020   Pfizer Covid-19 Vaccine Bivalent Booster 71yrs & up 10/22/2021   Td 06/22/2009    Health Maintenance  Topic Date Due   Hepatitis C Screening  Never done   Hepatitis B Vaccines (1 of 3 - 19+ 3-dose series) Never done   Cervical Cancer Screening (Pap smear)  10/23/2012   Colonoscopy  08/05/2014   DTaP/Tdap/Td (2 - Tdap) 06/23/2019   MAMMOGRAM  08/06/2019   INFLUENZA VACCINE  06/21/2024   COVID-19 Vaccine  Completed   HIV Screening  Completed   Zoster Vaccines- Shingrix  Completed   HPV VACCINES  Aged Out   Meningococcal B Vaccine  Aged Out    Patient Care Team: Pcp, No as PCP - General  Review of Systems  {Insert previous labs (optional):23779} {See past labs  Heme  Chem  Endocrine  Serology  Results Review (optional):1}   Objective    There were no vitals taken for this visit. {Insert last BP/Wt (optional):23777}{See vitals history (optional):1}   Physical Exam ***  Depression Screen     No data to display         No results found for any visits on 06/04/24.  Assessment & Plan     There are no diagnoses linked to  this encounter.   No follow-ups on file.      Manuelita Flatness, PA-C  [facility] [phone] (phone) [phone] (fax)  [facility]

## 2024-06-04 ENCOUNTER — Telehealth: Payer: Self-pay

## 2024-06-04 ENCOUNTER — Ambulatory Visit: Payer: Self-pay | Admitting: Physician Assistant

## 2024-06-04 ENCOUNTER — Encounter: Payer: Self-pay | Admitting: Physician Assistant

## 2024-06-04 VITALS — BP 117/85 | HR 83 | Ht 66.0 in | Wt 204.8 lb

## 2024-06-04 DIAGNOSIS — G8929 Other chronic pain: Secondary | ICD-10-CM | POA: Insufficient documentation

## 2024-06-04 DIAGNOSIS — R600 Localized edema: Secondary | ICD-10-CM | POA: Diagnosis not present

## 2024-06-04 DIAGNOSIS — M545 Low back pain, unspecified: Secondary | ICD-10-CM

## 2024-06-04 DIAGNOSIS — F411 Generalized anxiety disorder: Secondary | ICD-10-CM

## 2024-06-04 DIAGNOSIS — E119 Type 2 diabetes mellitus without complications: Secondary | ICD-10-CM

## 2024-06-04 DIAGNOSIS — Z8639 Personal history of other endocrine, nutritional and metabolic disease: Secondary | ICD-10-CM | POA: Insufficient documentation

## 2024-06-04 DIAGNOSIS — E038 Other specified hypothyroidism: Secondary | ICD-10-CM

## 2024-06-04 DIAGNOSIS — I10 Essential (primary) hypertension: Secondary | ICD-10-CM | POA: Diagnosis not present

## 2024-06-04 DIAGNOSIS — Z86711 Personal history of pulmonary embolism: Secondary | ICD-10-CM

## 2024-06-04 DIAGNOSIS — R110 Nausea: Secondary | ICD-10-CM

## 2024-06-04 DIAGNOSIS — K5790 Diverticulosis of intestine, part unspecified, without perforation or abscess without bleeding: Secondary | ICD-10-CM | POA: Insufficient documentation

## 2024-06-04 DIAGNOSIS — N3010 Interstitial cystitis (chronic) without hematuria: Secondary | ICD-10-CM

## 2024-06-04 DIAGNOSIS — L508 Other urticaria: Secondary | ICD-10-CM | POA: Insufficient documentation

## 2024-06-04 LAB — CBC WITH DIFFERENTIAL/PLATELET
Basophils Absolute: 0 K/uL (ref 0.0–0.1)
Basophils Relative: 0.6 % (ref 0.0–3.0)
Eosinophils Absolute: 0.2 K/uL (ref 0.0–0.7)
Eosinophils Relative: 3.1 % (ref 0.0–5.0)
HCT: 35.3 % — ABNORMAL LOW (ref 36.0–46.0)
Hemoglobin: 11.4 g/dL — ABNORMAL LOW (ref 12.0–15.0)
Lymphocytes Relative: 37.6 % (ref 12.0–46.0)
Lymphs Abs: 1.8 K/uL (ref 0.7–4.0)
MCHC: 32.4 g/dL (ref 30.0–36.0)
MCV: 91.3 fl (ref 78.0–100.0)
Monocytes Absolute: 0.5 K/uL (ref 0.1–1.0)
Monocytes Relative: 9.2 % (ref 3.0–12.0)
Neutro Abs: 2.4 K/uL (ref 1.4–7.7)
Neutrophils Relative %: 49.5 % (ref 43.0–77.0)
Platelets: 385 K/uL (ref 150.0–400.0)
RBC: 3.87 Mil/uL (ref 3.87–5.11)
RDW: 16.4 % — ABNORMAL HIGH (ref 11.5–15.5)
WBC: 4.9 K/uL (ref 4.0–10.5)

## 2024-06-04 LAB — COMPREHENSIVE METABOLIC PANEL WITH GFR
ALT: 11 U/L (ref 0–35)
AST: 13 U/L (ref 0–37)
Albumin: 4.1 g/dL (ref 3.5–5.2)
Alkaline Phosphatase: 74 U/L (ref 39–117)
BUN: 12 mg/dL (ref 6–23)
CO2: 30 meq/L (ref 19–32)
Calcium: 9.4 mg/dL (ref 8.4–10.5)
Chloride: 107 meq/L (ref 96–112)
Creatinine, Ser: 0.76 mg/dL (ref 0.40–1.20)
GFR: 88.58 mL/min (ref >60.00)
Glucose, Bld: 90 mg/dL (ref 70–99)
Potassium: 3.9 meq/L (ref 3.5–5.1)
Sodium: 143 meq/L (ref 135–145)
Total Bilirubin: 0.4 mg/dL (ref 0.2–1.2)
Total Protein: 6.9 g/dL (ref 6.0–8.3)

## 2024-06-04 LAB — IBC + FERRITIN
Ferritin: 6.9 ng/mL — ABNORMAL LOW (ref 10.0–291.0)
Iron: 49 ug/dL (ref 42–145)
Saturation Ratios: 12.3 % — ABNORMAL LOW (ref 20.0–50.0)
TIBC: 397.6 ug/dL (ref 250.0–450.0)
Transferrin: 284 mg/dL (ref 212.0–360.0)

## 2024-06-04 LAB — T4, FREE: Free T4: 0.88 ng/dL (ref 0.60–1.60)

## 2024-06-04 LAB — BRAIN NATRIURETIC PEPTIDE: Pro B Natriuretic peptide (BNP): 33 pg/mL (ref 0.0–100.0)

## 2024-06-04 LAB — LIPID PANEL
Cholesterol: 215 mg/dL — ABNORMAL HIGH (ref 0–200)
HDL: 50.9 mg/dL (ref >39.00)
LDL Cholesterol: 134 mg/dL — ABNORMAL HIGH (ref 0–99)
NonHDL: 164.18
Total CHOL/HDL Ratio: 4
Triglycerides: 153 mg/dL — ABNORMAL HIGH (ref 0.0–149.0)
VLDL: 30.6 mg/dL (ref 0.0–40.0)

## 2024-06-04 LAB — TSH: TSH: 4.34 u[IU]/mL (ref 0.35–5.50)

## 2024-06-04 LAB — HEMOGLOBIN A1C: Hgb A1c MFr Bld: 6.1 % (ref 4.6–6.5)

## 2024-06-04 LAB — MICROALBUMIN / CREATININE URINE RATIO
Creatinine,U: 342.9 mg/dL
Microalb Creat Ratio: 3.3 mg/g (ref 0.0–30.0)
Microalb, Ur: 1.1 mg/dL (ref 0.0–1.9)

## 2024-06-04 MED ORDER — TIZANIDINE HCL 4 MG PO TABS: 4.0000 mg | ORAL_TABLET | Freq: Four times a day (QID) | ORAL | 1 refills | Status: DC | PRN

## 2024-06-04 MED ORDER — ONDANSETRON HCL 4 MG PO TABS: 4.0000 mg | ORAL_TABLET | Freq: Three times a day (TID) | ORAL | 3 refills | Status: DC | PRN

## 2024-06-04 MED ORDER — OZEMPIC (1 MG/DOSE) 2 MG/1.5ML ~~LOC~~ SOPN
1.0000 mg | PEN_INJECTOR | SUBCUTANEOUS | 3 refills | Status: DC
Start: 2024-06-04 — End: 2024-10-10

## 2024-06-04 MED ORDER — HYDROXYZINE HCL 25 MG PO TABS: 25.0000 mg | ORAL_TABLET | Freq: Three times a day (TID) | ORAL | 1 refills | Status: DC | PRN

## 2024-06-04 MED ORDER — FERROUS SULFATE 325 (65 FE) MG PO TABS: 325.0000 mg | ORAL_TABLET | Freq: Two times a day (BID) | ORAL | 0 refills | Status: DC

## 2024-06-04 MED ORDER — EPINEPHRINE 0.3 MG/0.3ML IJ SOAJ: 0.3000 mg | INTRAMUSCULAR | 1 refills | Status: AC | PRN

## 2024-06-04 NOTE — Assessment & Plan Note (Signed)
 Per pt swelling w/ water intake? Will check bnp

## 2024-06-04 NOTE — Patient Instructions (Signed)
[email or web address]

## 2024-06-04 NOTE — Assessment & Plan Note (Signed)
 W/ COVID 19 illness. Pt follows with heme as she also has fhx of PE/DVT, but reports her w/u was negative.  She was anticoag appropriately after but is no longer.

## 2024-06-04 NOTE — Telephone Encounter (Signed)
 Pharmacy Patient Advocate Encounter   Received notification from Cover[employer]Meds that prior authorization for Ozempic  (1 MG/DOSE) 4MG /3ML pen-injectors  is required/requested.   Insurance verification completed.   The patient is insured through [employer] .   Per test claim: PA required; PA started via CoverMyMeds. KEY [id number] . Waiting for clinical questions to populate.

## 2024-06-04 NOTE — Assessment & Plan Note (Signed)
 Vs OAB. History of bladder w/u but years ago.  Discussed symptoms and trial of oxybutinin. Reviewed SE.  Pt will consider.

## 2024-06-04 NOTE — Assessment & Plan Note (Signed)
 Pt denies thyroid supplementation  Repeat tsh/t4

## 2024-06-04 NOTE — Assessment & Plan Note (Signed)
 Well controlled w/ telmisartan 80 mg  Ordering cmp F/b 6 mo

## 2024-06-04 NOTE — Assessment & Plan Note (Addendum)
 Long standing, since 20s. Undx, no specific allergies per pt.  H/o of angioedema. Has epipen .  Takes hydroxyzine  prn.  Discussed possibility of MCAS, consider ref to immunologist. Pt reports she has been seen by two different allergists without conclusion.

## 2024-06-04 NOTE — Assessment & Plan Note (Signed)
 Manages with lexapro  10 mg  Tolerating well

## 2024-06-04 NOTE — Assessment & Plan Note (Signed)
 Managed with ozempic  1 mg.  Repeat A1c today, uacr ordered.  On arb, statin.  Ordering fasting lipids

## 2024-06-05 ENCOUNTER — Ambulatory Visit: Payer: Self-pay | Admitting: Physician Assistant

## 2024-06-05 DIAGNOSIS — E782 Mixed hyperlipidemia: Secondary | ICD-10-CM

## 2024-06-05 MED ORDER — ROSUVASTATIN CALCIUM 10 MG PO TABS: 10.0000 mg | ORAL_TABLET | Freq: Every day | ORAL | 3 refills | Status: DC

## 2024-06-05 NOTE — Telephone Encounter (Signed)
 Pharmacy Patient Advocate Encounter  Received notification from [employer] that Prior Authorization for Ozempic  (1 MG/DOSE) 4MG /3ML pen-injectors has been APPROVED from 06/05/2024 to 06/05/2025   PA #/Case ID/Reference #: :[id number]

## 2024-06-05 NOTE — Telephone Encounter (Signed)
 PLEASE BE ADVISED Clinical questions have been answered and PA submitted.TO PLAN. PA currently Pending.

## 2024-06-06 ENCOUNTER — Encounter: Payer: Self-pay | Admitting: *Deleted

## 2024-06-07 DIAGNOSIS — H31092 Other chorioretinal scars, left eye: Secondary | ICD-10-CM | POA: Diagnosis not present

## 2024-06-07 DIAGNOSIS — R7309 Other abnormal glucose: Secondary | ICD-10-CM | POA: Diagnosis not present

## 2024-06-07 DIAGNOSIS — H2513 Age-related nuclear cataract, bilateral: Secondary | ICD-10-CM | POA: Diagnosis not present

## 2024-06-07 DIAGNOSIS — H35033 Hypertensive retinopathy, bilateral: Secondary | ICD-10-CM | POA: Diagnosis not present

## 2024-06-20 DIAGNOSIS — I2699 Other pulmonary embolism without acute cor pulmonale: Secondary | ICD-10-CM | POA: Diagnosis not present

## 2024-06-20 DIAGNOSIS — D500 Iron deficiency anemia secondary to blood loss (chronic): Secondary | ICD-10-CM | POA: Diagnosis not present

## 2024-07-20 ENCOUNTER — Ambulatory Visit: Admission: EM | Admit: 2024-07-20 | Discharge: 2024-07-20 | Disposition: A | Discharge status: Home / Self Care

## 2024-07-20 ENCOUNTER — Emergency Department (HOSPITAL_COMMUNITY): Admission: EM | Admit: 2024-07-20 | Discharge: 2024-07-20 | Discharge status: Against Medical Advice

## 2024-07-20 DIAGNOSIS — B9789 Other viral agents as the cause of diseases classified elsewhere: Secondary | ICD-10-CM | POA: Diagnosis not present

## 2024-07-20 DIAGNOSIS — M79662 Pain in left lower leg: Secondary | ICD-10-CM

## 2024-07-20 DIAGNOSIS — J029 Acute pharyngitis, unspecified: Secondary | ICD-10-CM

## 2024-07-20 DIAGNOSIS — J988 Other specified respiratory disorders: Secondary | ICD-10-CM

## 2024-07-20 LAB — POC SOFIA SARS ANTIGEN FIA: SARS Coronavirus 2 Ag: NEGATIVE

## 2024-07-20 LAB — POCT RAPID STREP A (OFFICE): Rapid Strep A Screen: NEGATIVE

## 2024-07-20 MED ORDER — PROMETHAZINE-DM 6.25-15 MG/5ML PO SYRP: 10.0000 mL | ORAL_SOLUTION | Freq: Four times a day (QID) | ORAL | 0 refills | Status: DC | PRN

## 2024-07-20 MED ORDER — LIDOCAINE VISCOUS HCL 2 % MT SOLN: OROMUCOSAL | 0 refills | Status: DC

## 2024-07-20 MED ORDER — LIDOCAINE VISCOUS HCL 2 % MT SOLN
15.0000 mL | Freq: Once | OROMUCOSAL | Status: AC
  Administered 2024-07-20: 15 mL via OROMUCOSAL

## 2024-07-20 NOTE — ED Triage Notes (Signed)
 Started with ha and sore throat yesterday, that has continued with nausea, chills, congestion, cough, hard to swallow. No fever known.

## 2024-07-20 NOTE — ED Provider Notes (Signed)
 [facility]    CSN: [id number] Arrival date & time: 07/20/24  0807      History   Chief Complaint Chief Complaint  Patient presents with   Sore Throat   Hoarse    HPI Kara Cannon is a 53 y.o. female.   Discussed the use of AI scribe software for clinical note transcription with the patient, who gave verbal consent to proceed.   The patient presents with multiple symptoms that started yesterday, including hoarseness, headache, nausea, chills, nasal congestion, cough, and difficulty swallowing. The patient denies fever. The patient reports a sore throat, describing it as razor-sharp and severe. They are experiencing a runny nose and some sneezing. The patient also mentions body aches. No vomiting, diarrhea, or shortness of breath is reported. The onset of symptoms appears to have been rapid. The patient describes a sudden deterioration on Friday.  Prior to the onset of these symptoms, the patient recalls having a headache on Thursday. They deny being around anyone known to be sick.  Patient also mentions that she noted some pain and tightness to the back of her left leg.  Started this morning.  It is very mild but reminds her of when she had a PE back in 2020 which was thought to be due to COVID-19 infection.  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  The following portions of the patient's history were reviewed and updated as appropriate: allergies, current medications, past family history, past medical history, past social history, past surgical history, and problem list.        Past Medical History:  Diagnosis Date   Abnormal ultrasound 03/02/2017   Formatting of this note might be different from the original. 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   Headache(784.0) 06/22/2009   HEMORRHOIDS, INTERNAL 06/23/2003   Hyperlipidemia    HYPERTENSION 06/22/2009   Hypochromic microcytic anemia 08/07/2021   IC (interstitial cystitis)    Impingement syndrome of right shoulder region 07/13/2020   Iron deficiency anemia due to chronic blood loss 04/28/2022   Keratosis pilaris 08/07/2021   Left lower quadrant pain 02/25/2023   Left wrist pain 01/13/2022   MIGRAINE HEADACHE 06/22/2009   Morbid obesity (HCC) 02/25/2023   Obesity due to excess calories with serious comorbidity 11/17/2016   OSA (obstructive sleep apnea) 03/29/2022   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   Pain in joint of right shoulder 11/07/2019   Perennial allergic rhinitis with seasonal variation 09/07/2019   PLANTAR WART, LEFT 08/03/2009   Postmenopausal bleeding 02/25/2023   Premature ovarian failure    Radial styloid tenosynovitis of left hand 05/06/2022   Raised TSH level 02/25/2023   Reactive  airway disease without asthma 09/07/2019   Rectal bleeding 02/25/2023   Screening for malignant neoplasm of colon 02/25/2023   Sleep apnea    Tenosynovitis, wrist 05/06/2022   Type 2 diabetes mellitus with hyperglycemia (HCC) 04/05/2019   Unspecified vitamin D deficiency 06/22/2009   Urinary frequency 06/22/2009   UTI'S, HX OF  06/22/2009   Vomiting without nausea 02/25/2023    Patient Active Problem List   Diagnosis Date Noted   Chronic bilateral low back pain without sciatica 06/04/2024   History of hypothyroidism 06/04/2024   Chronic urticaria 06/04/2024   Localized edema 06/04/2024   Diverticulosis 06/04/2024   Type 2 diabetes mellitus without complication, without long-term current use of insulin (HCC) 02/25/2023   History of pulmonary embolism    Premature ovarian failure 09/20/2016   IC (interstitial cystitis) 09/20/2016   Other specified hypothyroidism 09/22/2014   Mixed hyperlipidemia 04/05/2013   Generalized anxiety disorder 03/22/2013   Vitamin D deficiency 06/22/2009   Migraine without aura 06/22/2009   EPISCLERITIS 06/22/2009   Benign essential hypertension 06/22/2009   Cardiac murmur 06/22/2009   Hemorrhoids, internal 06/23/2003    Past Surgical History:  Procedure Laterality Date   CYSTOSCOPY  02/19/2009   Dr. Watt   FRACTURE SURGERY      OB History     Gravida  1   Para      Term      Preterm      AB  1   Living  0      SAB      IAB      Ectopic      Multiple      Live Births               Home Medications    Prior to Admission medications   Medication Sig Start Date End Date Taking? Authorizing Provider  adapalene (DIFFERIN) 0.1 % gel Apply topically at bedtime. 12/24/15  Yes [provider]  fexofenadine (ALLEGRA ALLERGY) 180 MG tablet Take 180 mg by mouth daily. 05/01/23  Yes [provider]  levocetirizine (XYZAL) 5 MG tablet Take 5 mg by mouth every evening. 05/11/23  Yes [provider]  lidocaine  (XYLOCAINE ) 2 % solution Gargle with 15 mL every 1-2 hours as needed for sore throat. 07/20/24  Yes Iola Lukes, FNP  nystatin-triamcinolone ointment (MYCOLOG) Apply 1 Application topically 2 (two) times daily. 07/04/22  Yes [provider]  promethazine -dextromethorphan  (PROMETHAZINE -DM) 6.25-15 MG/5ML syrup Take  10 mLs by mouth every 6 (six) hours as needed for cough. 07/20/24  Yes Iola Lukes, FNP  benzonatate  (TESSALON ) 100 MG capsule Take 100 mg by mouth 3 (three) times daily.    [provider]  chlorhexidine (PERIDEX) 0.12 % solution (Prior Auth: Rx Mzq#:[id number]) Mouth/Throat; Duration: 16    [provider]  chlorpheniramine-HYDROcodone  (TUSSIONEX) 10-8 MG/5ML Take 5 mLs by mouth as directed.    [provider]  chlorzoxazone  (PARAFON ) 500 MG tablet Take 500 mg by mouth.    [provider]  ciprofloxacin  (CIPRO ) 250 MG tablet Take 250 mg by mouth.    [provider]  clindamycin (CLEOCIN) 300 MG capsule Take 300 mg by mouth.    [provider]  diclofenac  (VOLTAREN ) 75 MG EC tablet Take 75 mg by mouth 2 (two) times daily.    [provider]  EPINEPHrine  0.3 mg/0.3 mL IJ SOAJ injection Inject 0.3 mg into the muscle as needed for anaphylaxis. 06/04/24   Cyndi Shaver, PA-C  escitalopram  (LEXAPRO )  10 MG tablet Take 1 tablet by mouth daily. 08/09/23 08/08/24  [provider]  famotidine  (PEPCID ) 40 MG tablet Take 40 mg by mouth 2 (two) times daily.    [provider]  ferrous sulfate  325 (65 FE) MG tablet Take 1 tablet (325 mg total) by mouth 2 (two) times daily with a meal. 06/04/24 07/04/24  Drubel, Manuelita, PA-C  hydrochlorothiazide  (HYDRODIURIL ) 12.5 MG tablet Take 12.5 mg by mouth daily.    [provider]  HYDROcodone  bit-homatropine (HYCODAN) 5-1.5 MG/5ML syrup Take 5 mLs by mouth every 4 (four) hours as needed.    [provider]  HYDROcodone -acetaminophen  (NORCO) 7.5-325 MG tablet Take 1-2 tablets by mouth as directed.    [provider]  hydrOXYzine  (ATARAX ) 25 MG tablet Take 1 tablet (25 mg total) by mouth every 8 (eight) hours as needed. 06/04/24   Cyndi Manuelita, PA-C  levothyroxine  (SYNTHROID ) 25 MCG tablet Take 25 mcg by mouth daily before breakfast.    [provider]   meloxicam (MOBIC) 15 MG tablet Take 15 mg by mouth daily. with food    [provider]  methocarbamol (ROBAXIN) 500 MG tablet Take 500 mg by mouth 3 (three) times daily. 08/09/23   [provider]  mirabegron ER (MYRBETRIQ) 50 MG TB24 tablet Take 50 mg by mouth daily.    [provider]  nystatin ointment (MYCOSTATIN) Apply 1 Application topically 2 (two) times daily.    [provider]  ondansetron  (ZOFRAN ) 4 MG tablet Take 1 tablet (4 mg total) by mouth every 8 (eight) hours as needed for nausea or vomiting. 06/04/24   Cyndi, Manuelita, PA-C  oxyCODONE -acetaminophen  (PERCOCET/ROXICET) 5-325 MG tablet Take 1-2 tablets by mouth every 6 (six) hours as needed.    [provider]  penicillin v potassium (VEETID) 500 MG tablet Take 500 mg by mouth.    [provider]  rosuvastatin  (CRESTOR ) 10 MG tablet Take 1 tablet (10 mg total) by mouth daily. 06/05/24   Cyndi Manuelita, PA-C  Semaglutide , 1 MG/DOSE, (OZEMPIC , 1 MG/DOSE,) 2 MG/1.5ML SOPN Inject 1 mg into the skin once a week. 06/04/24   Cyndi Manuelita, PA-C  telmisartan (MICARDIS) 80 MG tablet Take 1 tablet by mouth daily. 08/09/23   [provider]  tiZANidine  (ZANAFLEX ) 4 MG tablet Take 1 tablet (4 mg total) by mouth every 6 (six) hours as needed. 06/04/24   Cyndi Manuelita, PA-C  traZODone (DESYREL) 50 MG tablet Take 50 mg by mouth at bedtime as needed. 11/07/23   [provider]  Vitamin D, Ergocalciferol, 50000 units CAPS (Prior Auth: Rx Mzq#:[id number]) Oral; Duration: 84    [provider]  potassium chloride  (K-DUR) 10 MEQ tablet Take 1 tablet (10 mEq total) by mouth 2 (two) times daily. Patient not taking: Reported on 12/17/2015 05/15/13 12/18/15  Georgian Tara SAUNDERS, DO    Family History Family History  Problem Relation Age of Onset   Hypertension Mother    Asthma Mother    Diabetes Mother    Hypertension Father    Cancer Father    Ovarian cancer Paternal  Grandmother    Cancer Paternal Grandmother    Diabetes Maternal Grandmother    Hyperlipidemia Maternal Grandmother    Heart disease Maternal Grandfather    Skin cancer Maternal Uncle    Arthritis Maternal Aunt    Cancer Paternal Aunt    Cancer Paternal Aunt    Cancer Paternal Uncle    Cancer Paternal Aunt    Diabetes Maternal Uncle  Diabetes Maternal Aunt    Heart disease Paternal Aunt     Social History Social History   Tobacco Use   Smoking status: Never   Smokeless tobacco: Never   Tobacco comments:    Married  Advertising account planner   Vaping status: Never Used  Substance Use Topics   Alcohol use: Not Currently    Alcohol/week: 1.0 standard drink of alcohol    Types: 1 Glasses of wine per week   Drug use: No     Allergies   Sulfur, Naproxen sodium, and Sulfonamide derivatives   Review of Systems Review of Systems  Constitutional:  Positive for chills. Negative for fever.  HENT:  Positive for congestion, rhinorrhea, sneezing, sore throat and trouble swallowing.   Respiratory:  Positive for cough. Negative for shortness of breath.   Gastrointestinal:  Positive for nausea. Negative for diarrhea and vomiting.  Musculoskeletal:  Negative for myalgias.  Neurological:  Positive for headaches.  All other systems reviewed and are negative.    Physical Exam Triage Vital Signs ED Triage Vitals  Encounter Vitals Group     BP 07/20/24 0854 (!) 133/91     Girls Systolic BP Percentile --      Girls Diastolic BP Percentile --      Boys Systolic BP Percentile --      Boys Diastolic BP Percentile --      Pulse Rate 07/20/24 0854 90     Resp 07/20/24 0854 18     Temp 07/20/24 0854 98.9 F (37.2 C)     Temp Source 07/20/24 0854 Oral     SpO2 07/20/24 0854 98 %     Weight 07/20/24 0852 204 lb 12.9 oz (92.9 kg)     Height 07/20/24 0852 5' 6 (1.676 m)     Head Circumference --      Peak Flow --      Pain Score 07/20/24 0850 8     Pain Loc --      Pain Education --       Exclude from Growth Chart --    No data found.  Updated Vital Signs BP (!) 133/91 (BP Location: Left Arm)   Pulse 90   Temp 98.9 F (37.2 C) (Oral)   Resp 18   Ht 5' 6 (1.676 m)   Wt 204 lb 12.9 oz (92.9 kg)   SpO2 98%   BMI 33.06 kg/m   Visual Acuity Right Eye Distance:   Left Eye Distance:   Bilateral Distance:    Right Eye Near:   Left Eye Near:    Bilateral Near:     Physical Exam Vitals reviewed.  Constitutional:      General: She is awake. She is not in acute distress.    Appearance: Normal appearance. She is well-developed. She is not ill-appearing, toxic-appearing or diaphoretic.  HENT:     Head: Normocephalic.     Right Ear: Hearing, tympanic membrane, ear canal and external ear normal. No drainage, swelling or tenderness. No middle ear effusion. Tympanic membrane is not erythematous.     Left Ear: Hearing, tympanic membrane, ear canal and external ear normal. No drainage, swelling or tenderness.  No middle ear effusion. Tympanic membrane is not erythematous.     Nose: Congestion present. No rhinorrhea.     Right Sinus: No maxillary sinus tenderness or frontal sinus tenderness.     Left Sinus: No maxillary sinus tenderness or frontal sinus tenderness.     Mouth/Throat:     Lips:  Pink.     Mouth: Mucous membranes are moist.     Pharynx: Oropharynx is clear. Uvula midline. No pharyngeal swelling, oropharyngeal exudate, posterior oropharyngeal erythema or uvula swelling.     Tonsils: No tonsillar exudate or tonsillar abscesses.  Eyes:     General: Vision grossly intact.     Conjunctiva/sclera: Conjunctivae normal.  Cardiovascular:     Rate and Rhythm: Normal rate and regular rhythm.     Heart sounds: Normal heart sounds.  Pulmonary:     Effort: Pulmonary effort is normal.     Breath sounds: Normal breath sounds and air entry.     Comments: Respirations even and unlabored  Musculoskeletal:        General: Normal range of motion.     Cervical back: Full  passive range of motion without pain, normal range of motion and neck supple.     Left lower leg: Tenderness (posterior lower leg) present. No swelling. No edema.  Lymphadenopathy:     Cervical: No cervical adenopathy (tender).  Skin:    General: Skin is warm and dry.  Neurological:     General: No focal deficit present.     Mental Status: She is alert and oriented to person, place, and time.  Psychiatric:        Mood and Affect: Mood normal.        Behavior: Behavior normal. Behavior is cooperative.      UC Treatments / Results  Labs (all labs ordered are listed, but only abnormal results are displayed) Labs Reviewed  POCT RAPID STREP A (OFFICE) - Normal  POC SOFIA SARS ANTIGEN FIA - Normal    EKG   Radiology No results found.  Procedures Procedures (including critical care time)  Medications Ordered in UC Medications  lidocaine  (XYLOCAINE ) 2 % viscous mouth solution 15 mL (15 mLs Mouth/Throat Given 07/20/24 1009)    Initial Impression / Assessment and Plan / UC Course  I have reviewed the triage vital signs and the nursing notes.  Pertinent labs & imaging results that were available during my care of the patient were reviewed by me and considered in my medical decision making (see chart for details).    The patient presents with hoarseness, sore throat, nausea, chills, nasal congestion, cough, and difficulty swallowing that began yesterday. She also reports a headache, mild runny nose, and sneezing, but denies fever, vomiting, diarrhea, or shortness of breath. On examination, the throat showed no significant swelling or redness. Given the rapid onset and constellation of symptoms, viral etiology is suspected, with COVID-19 and streptococcal pharyngitis as differential diagnoses. Rapid strep and COVID tests were both negative. A throat culture was obtained, and the patient will be notified only if the result is positive. Viscous lidocaine  was administered in the clinic for  symptomatic relief, and a prescription for the same was provided.  Patient reports significant improvement in her symptoms after using the viscous lidocaine .  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 Follow-up evaluations showed no ongoing issues related to the previous pulmonary embolism. The patient reports new onset of mild tightness in the back of her left leg and localized mild tenderness along the posterior left leg. The patient does not smoke, is not on estrogen therapy, and has no active malignancy. The Wells score for DVT is calculated at 2, indicating a high probability of DVT based on tenderness and her prior  history of DVT. An outpatient ultrasound of the left leg will be ordered to rule out acute DVT.  The patient is advised to follow up with her primary care provider for ongoing evaluation of the sore throat and for the ultrasound results. If symptoms worsen or if she develops swelling, pain, or redness in the affected leg, she should seek immediate care at the emergency department.  Today's evaluation has revealed no signs of a dangerous process. Discussed diagnosis with patient and/or guardian. Patient and/or guardian aware of their diagnosis, possible red flag symptoms to watch out for and need for close follow up. Patient and/or guardian understands verbal and written discharge instructions. Patient and/or guardian comfortable with plan and disposition.  Patient and/or guardian has a clear mental status at this time, good insight into illness (after discussion and teaching) and has clear judgment to make decisions regarding their care  Documentation was completed with the aid of voice recognition software. Transcription may contain typographical errors.  Final Clinical Impressions(s) / UC Diagnoses   Final diagnoses:  Acute sore throat  Viral  respiratory illness  Pain in left lower leg     Discharge Instructions      You have been diagnosed with acute pharyngitis, likely caused by a viral infection. Your symptoms, which include hoarseness, sore throat, nausea, chills, nasal congestion, and difficulty swallowing, started yesterday. You also reported a mild headache, runny nose, and sneezing. COVID-19 and streptococcal pharyngitis have been ruled out based on testing, but a throat culture was sent for further confirmation. You may review the results through MyChart, and you will be notified if the culture is positive.  To manage your symptoms at home, you can continue using over-the-counter remedies such as throat lozenges or sprays for pain relief. You were also prescribed viscous lidocaine , which was administered in the clinic and will help numb your throat for comfort. Ensure to stay hydrated, rest, and avoid irritants such as smoke. If symptoms worsen or do not improve within a few days, follow up with your primary care provider for further evaluation.  In addition, you have a history of pulmonary embolism and bilateral lower extremity DVTs. You have reported mild tightness and tenderness in the back of your left leg. Given your history and the current symptoms, we have ordered an outpatient ultrasound of your left leg to rule out a new deep vein thrombosis (DVT). If you notice any swelling, redness, or increased pain in the leg, or if your symptoms become more severe, seek immediate attention at the emergency department.  If you have any concerns or your symptoms worsen, please do not hesitate to follow up with your primary care provider.      ED Prescriptions     Medication Sig Dispense Auth. Provider   lidocaine  (XYLOCAINE ) 2 % solution Gargle with 15 mL every 1-2 hours as needed for sore throat. 100 mL Iola Lukes, FNP   promethazine -dextromethorphan  (PROMETHAZINE -DM) 6.25-15 MG/5ML syrup Take 10 mLs by mouth every 6  (six) hours as needed for cough. 118 mL Iola Lukes, FNP      PDMP not reviewed this encounter.   Iola Lukes, [place] 07/20/24 1049

## 2024-07-20 NOTE — Discharge Instructions (Addendum)
 You have been diagnosed with acute pharyngitis, likely caused by a viral infection. Your symptoms, which include hoarseness, sore throat, nausea, chills, nasal congestion, and difficulty swallowing, started yesterday. You also reported a mild headache, runny nose, and sneezing. COVID-19 and streptococcal pharyngitis have been ruled out based on testing, but a throat culture was sent for further confirmation. You may review the results through MyChart, and you will be notified if the culture is positive.  To manage your symptoms at home, you can continue using over-the-counter remedies such as throat lozenges or sprays for pain relief. You were also prescribed viscous lidocaine , which was administered in the clinic and will help numb your throat for comfort. Ensure to stay hydrated, rest, and avoid irritants such as smoke. If symptoms worsen or do not improve within a few days, follow up with your primary care provider for further evaluation.  In addition, you have a history of pulmonary embolism and bilateral lower extremity DVTs. You have reported mild tightness and tenderness in the back of your left leg. Given your history and the current symptoms, we have ordered an outpatient ultrasound of your left leg to rule out a new deep vein thrombosis (DVT). If you notice any swelling, redness, or increased pain in the leg, or if your symptoms become more severe, seek immediate attention at the emergency department.  If you have any concerns or your symptoms worsen, please do not hesitate to follow up with your primary care provider.

## 2024-07-21 ENCOUNTER — Ambulatory Visit: Payer: Self-pay

## 2024-07-24 ENCOUNTER — Ambulatory Visit (INDEPENDENT_AMBULATORY_CARE_PROVIDER_SITE_OTHER)

## 2024-07-24 ENCOUNTER — Ambulatory Visit
Admission: RE | Admit: 2024-07-24 | Discharge: 2024-07-24 | Disposition: A | Discharge status: Home / Self Care | Source: Ambulatory Visit

## 2024-07-24 DIAGNOSIS — E669 Obesity, unspecified: Secondary | ICD-10-CM | POA: Insufficient documentation

## 2024-07-24 DIAGNOSIS — R7989 Other specified abnormal findings of blood chemistry: Secondary | ICD-10-CM | POA: Insufficient documentation

## 2024-07-24 DIAGNOSIS — J069 Acute upper respiratory infection, unspecified: Secondary | ICD-10-CM

## 2024-07-24 DIAGNOSIS — R1032 Left lower quadrant pain: Secondary | ICD-10-CM | POA: Insufficient documentation

## 2024-07-24 DIAGNOSIS — Z1211 Encounter for screening for malignant neoplasm of colon: Secondary | ICD-10-CM | POA: Insufficient documentation

## 2024-07-24 DIAGNOSIS — J301 Allergic rhinitis due to pollen: Secondary | ICD-10-CM | POA: Insufficient documentation

## 2024-07-24 DIAGNOSIS — E119 Type 2 diabetes mellitus without complications: Secondary | ICD-10-CM | POA: Insufficient documentation

## 2024-07-24 DIAGNOSIS — F32A Depression, unspecified: Secondary | ICD-10-CM | POA: Insufficient documentation

## 2024-07-24 DIAGNOSIS — N950 Postmenopausal bleeding: Secondary | ICD-10-CM | POA: Insufficient documentation

## 2024-07-24 NOTE — ED Provider Notes (Signed)
 [facility]    CSN: [id number] Arrival date & time: 07/24/24  0849      History   Chief Complaint No chief complaint on file.   HPI Kara Cannon is a 52 y.o. female.   HPI  Past Medical History:  Diagnosis Date   Abnormal ultrasound 03/02/2017   Formatting of this note might be different from the original. 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   Headache(784.0) 06/22/2009   HEMORRHOIDS, INTERNAL 06/23/2003   Hyperlipidemia    HYPERTENSION 06/22/2009   Hypochromic microcytic anemia 08/07/2021   IC (interstitial cystitis)    Impingement syndrome of right shoulder region 07/13/2020   Iron deficiency anemia due to chronic blood loss 04/28/2022   Keratosis pilaris 08/07/2021   Left lower quadrant pain 02/25/2023   Left wrist pain 01/13/2022   MIGRAINE HEADACHE 06/22/2009   Morbid obesity (HCC) 02/25/2023   Obesity due to excess calories with serious comorbidity 11/17/2016   OSA (obstructive sleep apnea) 03/29/2022   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   Other specified hypothyroidism 09/22/2014   Pain in joint of right shoulder  11/07/2019   Perennial allergic rhinitis with seasonal variation 09/07/2019   PLANTAR WART, LEFT 08/03/2009   Postmenopausal bleeding 02/25/2023   Premature ovarian failure    Radial styloid tenosynovitis of left hand 05/06/2022   Raised TSH level 02/25/2023   Reactive airway disease without asthma 09/07/2019   Rectal bleeding 02/25/2023   Screening for malignant neoplasm of colon 02/25/2023   Sleep apnea    Tenosynovitis, wrist 05/06/2022   Type 2 diabetes mellitus with hyperglycemia (HCC) 04/05/2019   Unspecified vitamin D deficiency 06/22/2009   Urinary frequency 06/22/2009   UTI'S, HX OF 06/22/2009   Vomiting without nausea 02/25/2023    Patient Active Problem List   Diagnosis Date Noted   Allergic rhinitis due to pollen 07/24/2024   Abdominal pain, LLQ 07/24/2024   Obesity 07/24/2024   Depressive disorder 07/24/2024   Postmenopausal bleeding 07/24/2024   High thyroid stimulating hormone (TSH) level 07/24/2024   Encounter for screening for malignant neoplasm of colon 07/24/2024   Diabetes mellitus (HCC) 07/24/2024   Chronic bilateral low back pain without sciatica 06/04/2024   History of hypothyroidism 06/04/2024   Chronic urticaria 06/04/2024   Localized edema 06/04/2024   Diverticulosis 06/04/2024   Pulmonary embolism (HCC) 04/27/2023   Type 2 diabetes mellitus without complication, without long-term current use of insulin (HCC) 02/25/2023   Radial styloid tenosynovitis (de quervain) 05/06/2022   Left wrist pain 01/13/2022   History of pulmonary embolism    Encounter for orthopedic follow-up care 07/17/2020   Pain in joint of right shoulder 11/07/2019   Premature ovarian  failure 09/20/2016   Mixed hyperlipidemia 04/05/2013   Generalized anxiety disorder 03/22/2013   Vitamin D deficiency 06/22/2009   Migraine without aura 06/22/2009   EPISCLERITIS 06/22/2009   Benign essential hypertension 06/22/2009   Cardiac murmur 06/22/2009   Hypertension 06/22/2009    Hemorrhoids, internal 06/23/2003    Past Surgical History:  Procedure Laterality Date   CYSTOSCOPY  02/19/2009   Dr. Watt   FRACTURE SURGERY      OB History     Gravida  1   Para      Term      Preterm      AB  1   Living  0      SAB      IAB      Ectopic      Multiple      Live Births               Home Medications    Prior to Admission medications   Medication Sig Start Date End Date Taking? Authorizing Provider  Salicylic Acid (SALVAX) 6 % FOAM 1 application to affected area Externally once a day to one thigh 12/24/15  Yes [provider]  adapalene (DIFFERIN) 0.1 % gel Apply topically at bedtime. 12/24/15   [provider]  benzonatate  (TESSALON ) 100 MG capsule Take 100 mg by mouth 3 (three) times daily.    [provider]  chlorhexidine (PERIDEX) 0.12 % solution (Prior Auth: Rx Mzq#:[id number]) Mouth/Throat; Duration: 16    [provider]  chlorpheniramine-HYDROcodone  (TUSSIONEX) 10-8 MG/5ML Take 5 mLs by mouth as directed.    [provider]  chlorzoxazone  (PARAFON ) 500 MG tablet Take 500 mg by mouth.    [provider]  ciprofloxacin  (CIPRO ) 250 MG tablet Take 250 mg by mouth.    [provider]  clindamycin (CLEOCIN) 300 MG capsule Take 300 mg by mouth.    [provider]  diclofenac  (VOLTAREN ) 75 MG EC tablet Take 75 mg by mouth 2 (two) times daily.    [provider]  EPINEPHrine  0.3 mg/0.3 mL IJ SOAJ injection Inject 0.3 mg into the muscle as needed for anaphylaxis. 06/04/24   Cyndi Shaver, PA-C  escitalopram  (LEXAPRO ) 10 MG tablet Take 1 tablet by mouth daily. 08/09/23 08/08/24  [provider]  famotidine  (PEPCID ) 40 MG tablet Take 40 mg by mouth 2 (two) times daily.    [provider]  ferrous sulfate  325 (65 FE) MG tablet Take 1 tablet (325 mg total) by mouth 2 (two) times daily with a meal. 06/04/24 07/04/24  Drubel, Shaver, PA-C  fexofenadine  (ALLEGRA ALLERGY) 180 MG tablet Take 180 mg by mouth daily. 05/01/23   [provider]  hydrochlorothiazide  (HYDRODIURIL ) 12.5 MG tablet Take 12.5 mg by mouth daily.    [provider]  HYDROcodone  bit-homatropine (HYCODAN) 5-1.5 MG/5ML syrup Take 5 mLs by mouth every 4 (four) hours as needed.    [provider]  HYDROcodone -acetaminophen  (NORCO) 7.5-325 MG tablet Take 1-2 tablets by mouth as directed.    [provider]  hydrOXYzine  (ATARAX ) 25 MG tablet Take 1 tablet (25 mg total) by mouth every 8 (eight) hours as needed. 06/04/24   Cyndi Shaver, PA-C  levocetirizine (XYZAL) 5 MG tablet Take 5 mg by mouth every evening. 05/11/23   [provider]  levothyroxine  (SYNTHROID ) 25 MCG tablet Take 25 mcg by mouth daily before breakfast.    [provider]  lidocaine  (XYLOCAINE ) 2 % solution Gargle with 15 mL  every 1-2 hours as needed for sore throat. 07/20/24   Murrill, Samantha, FNP  meloxicam (MOBIC) 15 MG tablet Take 15 mg by mouth daily. with food    [provider]  methocarbamol (ROBAXIN) 500 MG tablet Take 500 mg by mouth 3 (three) times daily. 08/09/23   [provider]  mirabegron ER (MYRBETRIQ) 50 MG TB24 tablet Take 50 mg by mouth daily.    [provider]  nystatin ointment (MYCOSTATIN) Apply 1 Application topically 2 (two) times daily.    [provider]  nystatin-triamcinolone ointment (MYCOLOG) Apply 1 Application topically 2 (two) times daily. 07/04/22   [provider]  ondansetron  (ZOFRAN ) 4 MG tablet Take 1 tablet (4 mg total) by mouth every 8 (eight) hours as needed for nausea or vomiting. 06/04/24   Cyndi, Manuelita, PA-C  oxyCODONE -acetaminophen  (PERCOCET/ROXICET) 5-325 MG tablet Take 1-2 tablets by mouth every 6 (six) hours as needed.    [provider]  penicillin v potassium (VEETID) 500 MG tablet Take 500 mg by mouth.    [provider]  pravastatin  (PRAVACHOL ) 20 MG  tablet Take 20 mg by mouth at bedtime as needed.    [provider]  predniSONE  (DELTASONE ) 10 MG tablet Take 10 mg by mouth as directed.    [provider]  promethazine -dextromethorphan  (PROMETHAZINE -DM) 6.25-15 MG/5ML syrup Take 10 mLs by mouth every 6 (six) hours as needed for cough. 07/20/24   Murrill, Samantha, FNP  rosuvastatin  (CRESTOR ) 10 MG tablet Take 1 tablet (10 mg total) by mouth daily. 06/05/24   Drubel, Manuelita, PA-C  Semaglutide , 1 MG/DOSE, (OZEMPIC , 1 MG/DOSE,) 2 MG/1.5ML SOPN Inject 1 mg into the skin once a week. 06/04/24   Cyndi Manuelita, PA-C  telmisartan (MICARDIS) 80 MG tablet Take 1 tablet by mouth daily. 08/09/23   [provider]  tiZANidine  (ZANAFLEX ) 4 MG tablet Take 1 tablet (4 mg total) by mouth every 6 (six) hours as needed. 06/04/24   Cyndi Manuelita, PA-C  traZODone (DESYREL) 50 MG tablet Take 50 mg by mouth at bedtime as needed. 11/07/23   [provider]  triamcinolone cream (KENALOG) 0.1 % Apply 1 Application topically 2 (two) times daily.    [provider]  triamcinolone ointment (KENALOG) 0.1 % Apply 1 Application topically 2 (two) times daily.    [provider]  Vitamin D, Ergocalciferol, 50000 units CAPS (Prior Auth: Rx [id number]) Oral; Duration: 84    [provider]  potassium chloride  (K-DUR) 10 MEQ tablet Take 1 tablet (10 mEq total) by mouth 2 (two) times daily. Patient not taking: Reported on 12/17/2015 05/15/13 12/18/15  Georgian Tara SAUNDERS, DO    Family History Family History  Problem Relation Age of Onset   Hypertension Mother    Asthma Mother    Diabetes Mother    Hypertension Father    Cancer Father    Ovarian cancer Paternal Grandmother    Cancer Paternal Grandmother    Diabetes Maternal Grandmother    Hyperlipidemia Maternal Grandmother    Heart disease Maternal Grandfather    Skin cancer Maternal Uncle    Arthritis Maternal Aunt    Cancer Paternal Aunt    Cancer Paternal  Aunt    Cancer Paternal Uncle    Cancer Paternal Aunt    Diabetes Maternal Uncle    Diabetes Maternal Aunt    Heart disease Paternal Aunt     Social History Social History   Tobacco Use   Smoking status: Never   Smokeless tobacco: Never  Tobacco comments:    Married  Advertising account planner   Vaping status: Never Used  Substance Use Topics   Alcohol use: Not Currently    Alcohol/week: 1.0 standard drink of alcohol    Types: 1 Glasses of wine per week   Drug use: No     Allergies   Sulfur, Naproxen sodium, and Sulfonamide derivatives   Review of Systems Review of Systems  Constitutional:  Negative for chills and fever.  HENT:  Positive for congestion, sinus pressure and sore throat. Negative for ear pain.   Eyes:  Negative for discharge and redness.  Respiratory:  Positive for cough. Negative for shortness of breath and wheezing.   Gastrointestinal:  Negative for abdominal pain, diarrhea, nausea and vomiting.     Physical Exam Triage Vital Signs ED Triage Vitals  Encounter Vitals Group     BP      Girls Systolic BP Percentile      Girls Diastolic BP Percentile      Boys Systolic BP Percentile      Boys Diastolic BP Percentile      Pulse      Resp      Temp      Temp src      SpO2      Weight      Height      Head Circumference      Peak Flow      Pain Score      Pain Loc      Pain Education      Exclude from Growth Chart    No data found.  Updated Vital Signs There were no vitals taken for this visit.  Visual Acuity Right Eye Distance:   Left Eye Distance:   Bilateral Distance:    Right Eye Near:   Left Eye Near:    Bilateral Near:     Physical Exam Vitals and nursing note reviewed.  Constitutional:      General: She is not in acute distress.    Appearance: Normal appearance. She is not ill-appearing.  HENT:     Head: Normocephalic and atraumatic.     Right Ear: Tympanic membrane normal.     Left Ear: Tympanic membrane normal.     Nose:  Congestion present.     Mouth/Throat:     Mouth: Mucous membranes are moist.     Pharynx: No oropharyngeal exudate or posterior oropharyngeal erythema.  Eyes:     Conjunctiva/sclera: Conjunctivae normal.  Cardiovascular:     Rate and Rhythm: Normal rate and regular rhythm.     Heart sounds: Normal heart sounds. No murmur heard. Pulmonary:     Effort: Pulmonary effort is normal. No respiratory distress.     Breath sounds: Normal breath sounds. No wheezing, rhonchi or rales.  Skin:    General: Skin is warm and dry.  Neurological:     Mental Status: She is alert.  Psychiatric:        Mood and Affect: Mood normal.        Thought Content: Thought content normal.      UC Treatments / Results  Labs (all labs ordered are listed, but only abnormal results are displayed) Labs Reviewed - No data to display  EKG   Radiology No results found.  Procedures Procedures (including critical care time)  Medications Ordered in UC Medications - No data to display  Initial Impression / Assessment and Plan / UC Course  I have reviewed the triage vital signs  and the nursing notes.  Pertinent labs & imaging results that were available during my care of the patient were reviewed by me and considered in my medical decision making (see chart for details).     *** Final Clinical Impressions(s) / UC Diagnoses   Final diagnoses:  None   Discharge Instructions   None    ED Prescriptions   None    PDMP not reviewed this encounter.

## 2024-07-24 NOTE — ED Triage Notes (Signed)
 I was just here Saturday, now I have the cough, runny nose, I was negative for COVID19 and strep throat but I want to make sure I don't have Pna, the cough is making my ribs hurt and now have a ha too. I was suppose to have that US  she ordered but had a problem with the hospital seeing it, so did not have it done, I will follow up with my PCP about that.

## 2024-10-10 ENCOUNTER — Encounter: Payer: Self-pay | Admitting: Nurse Practitioner

## 2024-10-10 ENCOUNTER — Ambulatory Visit: Admitting: Nurse Practitioner

## 2024-10-10 VITALS — BP 130/82 | HR 77 | Temp 97.3°F | Ht 66.0 in | Wt 207.4 lb

## 2024-10-10 DIAGNOSIS — I10 Essential (primary) hypertension: Secondary | ICD-10-CM

## 2024-10-10 DIAGNOSIS — E559 Vitamin D deficiency, unspecified: Secondary | ICD-10-CM

## 2024-10-10 DIAGNOSIS — L508 Other urticaria: Secondary | ICD-10-CM

## 2024-10-10 DIAGNOSIS — Z8639 Personal history of other endocrine, nutritional and metabolic disease: Secondary | ICD-10-CM | POA: Diagnosis not present

## 2024-10-10 DIAGNOSIS — Z23 Encounter for immunization: Secondary | ICD-10-CM | POA: Diagnosis not present

## 2024-10-10 DIAGNOSIS — Z1159 Encounter for screening for other viral diseases: Secondary | ICD-10-CM | POA: Diagnosis not present

## 2024-10-10 DIAGNOSIS — E119 Type 2 diabetes mellitus without complications: Secondary | ICD-10-CM | POA: Diagnosis not present

## 2024-10-10 DIAGNOSIS — Z86711 Personal history of pulmonary embolism: Secondary | ICD-10-CM

## 2024-10-10 DIAGNOSIS — E782 Mixed hyperlipidemia: Secondary | ICD-10-CM

## 2024-10-10 DIAGNOSIS — G4733 Obstructive sleep apnea (adult) (pediatric): Secondary | ICD-10-CM

## 2024-10-10 DIAGNOSIS — R252 Cramp and spasm: Secondary | ICD-10-CM

## 2024-10-10 DIAGNOSIS — R350 Frequency of micturition: Secondary | ICD-10-CM

## 2024-10-10 DIAGNOSIS — M545 Low back pain, unspecified: Secondary | ICD-10-CM

## 2024-10-10 DIAGNOSIS — Z6833 Body mass index (BMI) 33.0-33.9, adult: Secondary | ICD-10-CM

## 2024-10-10 DIAGNOSIS — Z7985 Long-term (current) use of injectable non-insulin antidiabetic drugs: Secondary | ICD-10-CM

## 2024-10-10 DIAGNOSIS — K5900 Constipation, unspecified: Secondary | ICD-10-CM

## 2024-10-10 DIAGNOSIS — G43009 Migraine without aura, not intractable, without status migrainosus: Secondary | ICD-10-CM

## 2024-10-10 DIAGNOSIS — E669 Obesity, unspecified: Secondary | ICD-10-CM

## 2024-10-10 DIAGNOSIS — G8929 Other chronic pain: Secondary | ICD-10-CM

## 2024-10-10 DIAGNOSIS — F411 Generalized anxiety disorder: Secondary | ICD-10-CM

## 2024-10-10 LAB — COMPREHENSIVE METABOLIC PANEL WITH GFR
ALT: 15 U/L (ref 0–35)
AST: 17 U/L (ref 0–37)
Albumin: 4.4 g/dL (ref 3.5–5.2)
Alkaline Phosphatase: 77 U/L (ref 39–117)
BUN: 12 mg/dL (ref 6–23)
CO2: 29 meq/L (ref 19–32)
Calcium: 9.5 mg/dL (ref 8.4–10.5)
Chloride: 104 meq/L (ref 96–112)
Creatinine, Ser: 0.72 mg/dL (ref 0.40–1.20)
GFR: 94.29 mL/min (ref >60.00)
Glucose, Bld: 80 mg/dL (ref 70–99)
Potassium: 3.7 meq/L (ref 3.5–5.1)
Sodium: 140 meq/L (ref 135–145)
Total Bilirubin: 0.6 mg/dL (ref 0.2–1.2)
Total Protein: 7.9 g/dL (ref 6.0–8.3)

## 2024-10-10 LAB — CBC WITH DIFFERENTIAL/PLATELET
Basophils Absolute: 0 K/uL (ref 0.0–0.1)
Basophils Relative: 0.6 % (ref 0.0–3.0)
Eosinophils Absolute: 0.1 K/uL (ref 0.0–0.7)
Eosinophils Relative: 1.8 % (ref 0.0–5.0)
HCT: 39.6 % (ref 36.0–46.0)
Hemoglobin: 13 g/dL (ref 12.0–15.0)
Lymphocytes Relative: 46.2 % — ABNORMAL HIGH (ref 12.0–46.0)
Lymphs Abs: 2.2 K/uL (ref 0.7–4.0)
MCHC: 32.7 g/dL (ref 30.0–36.0)
MCV: 95.7 fl (ref 78.0–100.0)
Monocytes Absolute: 0.5 K/uL (ref 0.1–1.0)
Monocytes Relative: 9.5 % (ref 3.0–12.0)
Neutro Abs: 2 K/uL (ref 1.4–7.7)
Neutrophils Relative %: 41.9 % — ABNORMAL LOW (ref 43.0–77.0)
Platelets: 371 K/uL (ref 150.0–400.0)
RBC: 4.14 Mil/uL (ref 3.87–5.11)
RDW: 15.3 % (ref 11.5–15.5)
WBC: 4.8 K/uL (ref 4.0–10.5)

## 2024-10-10 LAB — MAGNESIUM: Magnesium: 2 mg/dL (ref 1.5–2.5)

## 2024-10-10 LAB — LIPID PANEL
Cholesterol: 208 mg/dL — ABNORMAL HIGH (ref 0–200)
HDL: 59.7 mg/dL (ref >39.00)
LDL Cholesterol: 128 mg/dL — ABNORMAL HIGH (ref 0–99)
NonHDL: 148.69
Total CHOL/HDL Ratio: 3
Triglycerides: 105 mg/dL (ref 0.0–149.0)
VLDL: 21 mg/dL (ref 0.0–40.0)

## 2024-10-10 LAB — TSH: TSH: 2.81 u[IU]/mL (ref 0.35–5.50)

## 2024-10-10 LAB — VITAMIN D 25 HYDROXY (VIT D DEFICIENCY, FRACTURES): VITD: 21.91 ng/mL — ABNORMAL LOW (ref 30.00–100.00)

## 2024-10-10 LAB — HEMOGLOBIN A1C: Hgb A1c MFr Bld: 5.8 % (ref 4.6–6.5)

## 2024-10-10 LAB — CK: Total CK: 122 U/L (ref 17–177)

## 2024-10-10 LAB — T4, FREE: Free T4: 0.72 ng/dL (ref 0.60–1.60)

## 2024-10-10 MED ORDER — OZEMPIC (1 MG/DOSE) 2 MG/1.5ML ~~LOC~~ SOPN: 1.0000 mg | PEN_INJECTOR | SUBCUTANEOUS | 3 refills | Status: AC

## 2024-10-10 MED ORDER — FERROUS SULFATE 325 (65 FE) MG PO TABS: 325.0000 mg | ORAL_TABLET | Freq: Two times a day (BID) | ORAL | 0 refills | Status: DC

## 2024-10-10 MED ORDER — HYDROXYZINE HCL 25 MG PO TABS: 25.0000 mg | ORAL_TABLET | Freq: Three times a day (TID) | ORAL | 1 refills | Status: AC | PRN

## 2024-10-10 MED ORDER — TRAZODONE HCL 50 MG PO TABS
50.0000 mg | ORAL_TABLET | Freq: Every evening | ORAL | 3 refills | Status: AC | PRN
Start: 2024-10-10 — End: ?

## 2024-10-10 MED ORDER — TIZANIDINE HCL 4 MG PO TABS: 4.0000 mg | ORAL_TABLET | Freq: Four times a day (QID) | ORAL | 1 refills | Status: DC | PRN

## 2024-10-10 MED ORDER — LIDOCAINE VISCOUS HCL 2 % MT SOLN: OROMUCOSAL | 0 refills | Status: AC

## 2024-10-10 MED ORDER — TELMISARTAN 80 MG PO TABS: 80.0000 mg | ORAL_TABLET | Freq: Every day | ORAL | 3 refills | Status: AC

## 2024-10-10 MED ORDER — ROSUVASTATIN CALCIUM 10 MG PO TABS: 10.0000 mg | ORAL_TABLET | Freq: Every day | ORAL | 3 refills | Status: AC

## 2024-10-10 NOTE — Progress Notes (Signed)
 New Patient Visit  BP 130/82 Comment: home reading  Pulse 77   Temp (!) 97.3 F (36.3 C)   Ht 5' 6 (1.676 m)   Wt 207 lb 6.4 oz (94.1 kg)   SpO2 98%   BMI 33.48 kg/m    Subjective:    Patient ID: Shiela JONELLE Hummer, female    DOB: 01/31/1971, 53 y.o.   MRN: [id number]  CC: Chief Complaint  Patient presents with   Transfer of Care    Est. Care, concerns with feels tired, Rx refills, back muscle spasms    HPI: NICK ARMEL is a 53 y.o. female presents to transfer care to a new provider.  Introduced to publishing rights manager role and practice setting.  All questions answered.  Discussed provider/patient relationship and expectations.  Discussed the use of AI scribe software for clinical note transcription with the patient, who gave verbal consent to proceed.  History of Present Illness   LATASHIA KOCH is a 53 year old female with a history of muscle spasms and pulmonary embolism who presents to establish care and for evaluation of muscle spasms and other ongoing health issues.  She has experienced muscle spasms in her back since age 54, described as a sensation of 'a knife going from my back through to my stomach.' The spasms have intensified over time and are more frequent with certain activities at work, where she is on her feet all day. She previously used tramadol , which is no longer effective, and currently takes tizanidine  as needed, which helps but causes drowsiness. She notes that she was told this could be related to a food allergy. It happens intermittently and can go 6 months without occurring.   She has a history of pulmonary embolism during a COVID-19 infection, leading to increased susceptibility to colds with severe symptoms such as sore throat and prolonged illness. She also experiences episodes of lightheadedness and dizziness since having COVID-19. She is no longer on anticoagulation.   She has diabetes and is currently on Ozempic  1 mg. She does not regularly  check her blood sugar at home. Her weight has been stable, but she acknowledges dietary indulgences.  She has interstitial cystitis, managed with various treatments over time. She reports frequent urination but notes it is not as severe as before. She also experiences constipation, which she attributes to her iron supplements and possibly Ozempic . She does not drink much water due to swelling concerns.  She has a history of migraines since childhood, which occur less frequently now. She also has sleep apnea but has not used her CPAP machine for about a year due to insurance issues. She reports significant fatigue.        10/10/2024    9:03 AM 06/04/2024    8:26 AM  Depression screen PHQ 2/9  Decreased Interest 0 0  Down, Depressed, Hopeless 0 0  PHQ - 2 Score 0 0  Altered sleeping 2   Tired, decreased energy 3   Change in appetite 0   Feeling bad or failure about yourself  0   Trouble concentrating 0   Moving slowly or fidgety/restless 0   Suicidal thoughts 0   PHQ-9 Score 5   Difficult doing work/chores Somewhat difficult       10/10/2024    9:03 AM  GAD 7 : Generalized Anxiety Score  Nervous, Anxious, on Edge 0  Control/stop worrying 0  Worry too much - different things 0  Trouble relaxing 0  Restless 0  Easily annoyed or  irritable 0  Afraid - awful might happen 0  Total GAD 7 Score 0  Anxiety Difficulty Not difficult at all    Past Medical History:  Diagnosis Date   Abnormal ultrasound 03/02/2017   Formatting of this note might be different from the original. 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   Headache(784.0) 06/22/2009   HEMORRHOIDS, INTERNAL 06/23/2003   Hyperlipidemia    HYPERTENSION 06/22/2009   Hypochromic microcytic anemia 08/07/2021   IC (interstitial cystitis)    Impingement syndrome of right shoulder region 07/13/2020   Iron deficiency anemia due to chronic blood loss 04/28/2022   Keratosis pilaris 08/07/2021   Left lower quadrant pain 02/25/2023   Left wrist pain 01/13/2022   MIGRAINE HEADACHE 06/22/2009   Morbid obesity (HCC) 02/25/2023   OSA (obstructive sleep apnea) 03/29/2022   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   Other specified hypothyroidism 09/22/2014   Pain in joint of right shoulder 11/07/2019   Perennial allergic rhinitis with seasonal variation 09/07/2019   PLANTAR WART, LEFT 08/03/2009   Postmenopausal bleeding 02/25/2023   Premature ovarian failure    Radial styloid tenosynovitis of left hand 05/06/2022   Reactive airway disease without asthma 09/07/2019   Rectal bleeding 02/25/2023   Screening for malignant neoplasm of colon 02/25/2023   Tenosynovitis, wrist 05/06/2022   Type 2 diabetes mellitus with hyperglycemia (HCC) 04/05/2019   Unspecified vitamin D  deficiency 06/22/2009   Urinary frequency 06/22/2009   UTI'S, HX OF 06/22/2009   Vomiting without nausea 02/25/2023    Past Surgical History:  Procedure Laterality Date   CYSTOSCOPY  02/19/2009   Dr. Watt   FRACTURE SURGERY Left    left wrist   ROTATOR CUFF REPAIR Right     Family History  Problem Relation Age of Onset   Hypertension Mother    Asthma Mother    Diabetes Mother    Hypertension Father    Cancer  Father    Ovarian cancer Paternal Grandmother    Cancer Paternal Grandmother    Diabetes Maternal Grandmother    Hyperlipidemia Maternal Grandmother    Heart disease Maternal Grandfather    Skin cancer Maternal Uncle    Arthritis Maternal Aunt    Cancer Paternal Aunt    Cancer Paternal Aunt    Cancer Paternal Uncle    Cancer Paternal Aunt    Diabetes Maternal Uncle    Diabetes Maternal Aunt    Heart disease Paternal Aunt      Social History   Tobacco Use   Smoking status: Never   Smokeless tobacco: Never   Tobacco comments:    Married  Advertising Account Planner   Vaping status: Never Used  Substance Use Topics   Alcohol use: Not Currently    Alcohol/week: 1.0 standard drink of  alcohol    Types: 1 Glasses of wine per week   Drug use: No    Current Outpatient Medications on File Prior to Visit  Medication Sig Dispense Refill   EPINEPHrine  0.3 mg/0.3 mL IJ SOAJ injection Inject 0.3 mg into the muscle as needed for anaphylaxis. 1 each 1   fexofenadine (ALLEGRA ALLERGY) 180 MG tablet Take 180 mg by mouth daily.     meloxicam (MOBIC) 15 MG tablet Take 15 mg by mouth daily. with food (Patient taking differently: Take 15 mg by mouth as needed. with food)     triamcinolone cream (KENALOG) 0.1 % Apply 1 Application topically 2 (two) times daily.     [DISCONTINUED] potassium chloride  (K-DUR) 10 MEQ tablet Take 1 tablet (10 mEq total) by mouth 2 (two) times daily. (Patient not taking: Reported on 12/17/2015) 180 tablet 1   No current facility-administered medications on file prior to visit.     Review of Systems  Constitutional:  Positive for fatigue. Negative for fever.  HENT: Negative.    Respiratory: Negative.    Cardiovascular: Negative.   Gastrointestinal:  Positive for constipation. Negative for abdominal pain, diarrhea, nausea and vomiting.  Genitourinary: Negative.   Musculoskeletal:  Positive for back pain.  Skin: Negative.   Neurological: Negative.   Psychiatric/Behavioral:  Negative.        Objective:    BP 130/82 Comment: home reading  Pulse 77   Temp (!) 97.3 F (36.3 C)   Ht 5' 6 (1.676 m)   Wt 207 lb 6.4 oz (94.1 kg)   SpO2 98%   BMI 33.48 kg/m   Wt Readings from Last 3 Encounters:  10/10/24 207 lb 6.4 oz (94.1 kg)  07/24/24 204 lb 12.9 oz (92.9 kg)  07/20/24 204 lb 12.9 oz (92.9 kg)    BP Readings from Last 3 Encounters:  10/10/24 130/82  07/24/24 (!) 145/84  07/20/24 (!) 133/91    Physical Exam Vitals and nursing note reviewed.  Constitutional:      General: She is not in acute distress.    Appearance: Normal appearance.  HENT:     Head: Normocephalic and atraumatic.     Right Ear: Tympanic membrane, ear canal and external ear normal.     Left Ear: Tympanic membrane, ear canal and external ear normal.  Eyes:     Conjunctiva/sclera: Conjunctivae normal.  Cardiovascular:     Rate and Rhythm: Normal rate and regular rhythm.     Pulses: Normal pulses.     Heart sounds: Normal heart sounds.  Pulmonary:     Effort: Pulmonary effort is normal.     Breath sounds: Normal breath sounds.  Abdominal:     Palpations: Abdomen is soft.     Tenderness: There is no abdominal tenderness.  Musculoskeletal:        General: Normal range of motion.     Cervical back: Normal range of motion and neck supple.     Right lower leg: No edema.     Left lower leg: No edema.  Lymphadenopathy:     Cervical: No cervical adenopathy.  Skin:    General: Skin is warm and dry.  Neurological:     General: No focal deficit present.     Mental Status: She is alert and oriented to person, place, and time.     Cranial Nerves: No cranial nerve deficit.     Coordination: Coordination normal.     Gait: Gait normal.  Psychiatric:  Mood and Affect: Mood normal.        Behavior: Behavior normal.        Thought Content: Thought content normal.        Judgment: Judgment normal.        Assessment & Plan:   Problem List Items Addressed This Visit        Cardiovascular and Mediastinum   Migraine without aura   She has migraines since childhood, currently infrequent with only two episodes this year.       Relevant Medications   rosuvastatin  (CRESTOR ) 10 MG tablet   telmisartan  (MICARDIS ) 80 MG tablet   tiZANidine  (ZANAFLEX ) 4 MG tablet   traZODone  (DESYREL ) 50 MG tablet   Hypertension   Chronic, stable. Her home blood pressure readings range from 120-140 mmHg systolic, with an office reading of 152/80 mmHg. She is on telmisartan  80 mg. Continue telmisartan  80 mg and monitor blood pressure at home. Bring blood pressure cuff to the next visit for calibration. Check CMP, CBC today.      Relevant Medications   rosuvastatin  (CRESTOR ) 10 MG tablet   telmisartan  (MICARDIS ) 80 MG tablet     Respiratory   OSA (obstructive sleep apnea)   Encourage resumption of CPAP therapy and follow up with a pulmonologist for CPAP management.        Endocrine   Type 2 diabetes mellitus without complication, without long-term current use of insulin (HCC) - Primary   Chronic, stable. There is no home glucose monitoring. Continue Ozempic  1 mg and encourage home glucose monitoring. Check CMP, CBC, A1c, lipid panel today. Recommend yearly eye exam.       Relevant Medications   rosuvastatin  (CRESTOR ) 10 MG tablet   Semaglutide , 1 MG/DOSE, (OZEMPIC , 1 MG/DOSE,) 2 MG/1.5ML SOPN   telmisartan  (MICARDIS ) 80 MG tablet   Other Relevant Orders   Pneumococcal conjugate vaccine 20-valent (Completed)   CBC with Differential/Platelet (Completed)   Comprehensive metabolic panel with GFR (Completed)   Hemoglobin A1c (Completed)     Musculoskeletal and Integument   Chronic urticaria   Grape fruit juice known trigger, however other unknown triggers. Continue hydroxyzine  as needed.       Relevant Medications   hydrOXYzine  (ATARAX ) 25 MG tablet     Other   Vitamin D  deficiency   Check vitamin D  and treat based on results.       Relevant Orders   VITAMIN D  25  Hydroxy (Vit-D Deficiency, Fractures) (Completed)   Generalized anxiety disorder   Chronic, stable. Not taking any medication currently. Follow-up with any concerns.       Relevant Medications   hydrOXYzine  (ATARAX ) 25 MG tablet   traZODone  (DESYREL ) 50 MG tablet   Mixed hyperlipidemia   She is on rosuvastatin  10 mg and reports muscle aches potentially related to the medication. Stop rosuvastatin  for one week to assess for improvement in muscle aches. Check CMP, CBC, lipid panel, and CK today.       Relevant Medications   rosuvastatin  (CRESTOR ) 10 MG tablet   telmisartan  (MICARDIS ) 80 MG tablet   Other Relevant Orders   CBC with Differential/Platelet (Completed)   Comprehensive metabolic panel with GFR (Completed)   Lipid panel (Completed)   History of pulmonary embolism   She had a pulmonary embolism associated with COVID. No current anticoagulation therapy is in place since it was considered provoked.       Chronic bilateral low back pain without sciatica   Chronic low back pain with muscle spasms is  exacerbated by work activities and managed with tizanidine  as needed. Continue tizanidine  4mg  TID as needed and encourage stretching exercises.      Relevant Medications   tiZANidine  (ZANAFLEX ) 4 MG tablet   traZODone  (DESYREL ) 50 MG tablet   History of hypothyroidism   Check TSH, free T4, TPO antibodies today. She is not currently on medication.       Relevant Medications   ferrous sulfate  325 (65 FE) MG tablet   Other Relevant Orders   TSH (Completed)   T4, free (Completed)   Thyroid  Peroxidase Antibodies (TPO) (REFL)   Obesity (BMI 30-39.9)   BMI 33.4. Discussed nutrition, exercise.       Relevant Medications   Semaglutide , 1 MG/DOSE, (OZEMPIC , 1 MG/DOSE,) 2 MG/1.5ML SOPN   History of iron deficiency   She is currently taking iron supplements and reports fatigue, possibly related to anemia. Continue iron supplementation and check CBC, iron, ferritin today.       Relevant Medications   ferrous sulfate  325 (65 FE) MG tablet   Muscle cramp   She experiences frequent muscle cramps in legs and toes, occurring 3-4 times a week. Possible dehydration and electrolyte imbalance are considered. Increase water intake and check electrolytes, including magnesium and potassium.      Relevant Orders   Magnesium (Completed)   CK (Completed)   Long-term current use of injectable noninsulin antidiabetic medication   Continue ozempic  1mg  weekly.       Other Visit Diagnoses       Encounter for hepatitis C screening test for low risk patient       Screen hepatitis C today   Relevant Orders   Hepatitis C antibody     Immunization due       Prevnar 20 given today   Relevant Orders   Pneumococcal conjugate vaccine 20-valent (Completed)     Constipation, unspecified constipation type       Possibly exacerbated by iron supplementation and Ozempic . She reports infrequent bowel movements. Recommend Miralax  as needed for constipation.     Urinary frequency       Chronic, stable. No current medications needed.        Follow up plan: Return in about 3 months (around 01/10/2025) for Diabetes.  Cortavious Nix A Evva Din

## 2024-10-10 NOTE — Patient Instructions (Addendum)
 It was great to see you!  We are checking your labs today and will let you know the results via mychart/phone.   I have refilled your medications  Continue stretching regularly   Stop the cholesterol medicine for 1 week to see if this helps with muscle cramping and send me a mychart message  Bring your BP cuff to next visit   Let's follow-up in 3 months, sooner if you have concerns.  If a referral was placed today, you will be contacted for an appointment. Please note that routine referrals can sometimes take up to 3-4 weeks to process. Please call our office if you haven't heard anything after this time frame.  Take care,  Tinnie Harada, NP

## 2024-10-11 ENCOUNTER — Ambulatory Visit: Payer: Self-pay | Admitting: Nurse Practitioner

## 2024-10-11 DIAGNOSIS — Z7985 Long-term (current) use of injectable non-insulin antidiabetic drugs: Secondary | ICD-10-CM | POA: Insufficient documentation

## 2024-10-11 DIAGNOSIS — R252 Cramp and spasm: Secondary | ICD-10-CM | POA: Insufficient documentation

## 2024-10-11 NOTE — Assessment & Plan Note (Signed)
 She has migraines since childhood, currently infrequent with only two episodes this year.

## 2024-10-11 NOTE — Assessment & Plan Note (Signed)
 Chronic, stable. There is no home glucose monitoring. Continue Ozempic  1 mg and encourage home glucose monitoring. Check CMP, CBC, A1c, lipid panel today. Recommend yearly eye exam.

## 2024-10-11 NOTE — Assessment & Plan Note (Signed)
 She had a pulmonary embolism associated with COVID. No current anticoagulation therapy is in place since it was considered provoked.

## 2024-10-11 NOTE — Assessment & Plan Note (Signed)
 Encourage resumption of CPAP therapy and follow up with a pulmonologist for CPAP management.

## 2024-10-11 NOTE — Assessment & Plan Note (Signed)
 Grape fruit juice known trigger, however other unknown triggers. Continue hydroxyzine  as needed.

## 2024-10-11 NOTE — Assessment & Plan Note (Signed)
 She experiences frequent muscle cramps in legs and toes, occurring 3-4 times a week. Possible dehydration and electrolyte imbalance are considered. Increase water intake and check electrolytes, including magnesium and potassium.

## 2024-10-11 NOTE — Assessment & Plan Note (Signed)
 She is currently taking iron supplements and reports fatigue, possibly related to anemia. Continue iron supplementation and check CBC, iron, ferritin today.

## 2024-10-11 NOTE — Assessment & Plan Note (Signed)
Check vitamin D and treat based on results.  ?

## 2024-10-11 NOTE — Assessment & Plan Note (Signed)
 Check TSH, free T4, TPO antibodies today. She is not currently on medication.

## 2024-10-11 NOTE — Assessment & Plan Note (Signed)
 Chronic low back pain with muscle spasms is exacerbated by work activities and managed with tizanidine  as needed. Continue tizanidine  4mg  TID as needed and encourage stretching exercises.

## 2024-10-11 NOTE — Assessment & Plan Note (Signed)
 BMI 33.4. Discussed nutrition, exercise.

## 2024-10-11 NOTE — Assessment & Plan Note (Signed)
 She is on rosuvastatin  10 mg and reports muscle aches potentially related to the medication. Stop rosuvastatin  for one week to assess for improvement in muscle aches. Check CMP, CBC, lipid panel, and CK today.

## 2024-10-11 NOTE — Assessment & Plan Note (Signed)
 Chronic, stable. Not taking any medication currently. Follow-up with any concerns.

## 2024-10-11 NOTE — Assessment & Plan Note (Signed)
 Chronic, stable. Her home blood pressure readings range from 120-140 mmHg systolic, with an office reading of 152/80 mmHg. She is on telmisartan  80 mg. Continue telmisartan  80 mg and monitor blood pressure at home. Bring blood pressure cuff to the next visit for calibration. Check CMP, CBC today.

## 2024-10-11 NOTE — Assessment & Plan Note (Signed)
 Continue ozempic 1 mg weekly.

## 2024-10-14 LAB — THYROID PEROXIDASE ANTIBODIES (TPO) (REFL): Thyroperoxidase Ab SerPl-aCnc: 1 [IU]/mL (ref <9)

## 2024-10-14 LAB — HEPATITIS C ANTIBODY: Hepatitis C Ab: NONREACTIVE

## 2024-10-20 ENCOUNTER — Encounter (HOSPITAL_COMMUNITY): Payer: Self-pay | Admitting: Emergency Medicine

## 2024-10-20 ENCOUNTER — Ambulatory Visit (HOSPITAL_COMMUNITY)
Admission: EM | Admit: 2024-10-20 | Discharge: 2024-10-20 | Disposition: A | Discharge status: Home / Self Care | Attending: Physician Assistant | Admitting: Physician Assistant

## 2024-10-20 ENCOUNTER — Other Ambulatory Visit: Payer: Self-pay

## 2024-10-20 DIAGNOSIS — R519 Headache, unspecified: Secondary | ICD-10-CM

## 2024-10-20 MED ORDER — KETOROLAC TROMETHAMINE 30 MG/ML IJ SOLN
INTRAMUSCULAR | Status: AC
  Filled 2024-10-20: qty 1

## 2024-10-20 MED ORDER — BACLOFEN 10 MG PO TABS: 10.0000 mg | ORAL_TABLET | Freq: Every evening | ORAL | 0 refills | Status: AC | PRN

## 2024-10-20 MED ORDER — KETOROLAC TROMETHAMINE 30 MG/ML IJ SOLN
30.0000 mg | Freq: Once | INTRAMUSCULAR | Status: AC
  Administered 2024-10-20: 30 mg via INTRAMUSCULAR

## 2024-10-20 NOTE — ED Triage Notes (Signed)
 For 3 days, wakes with a headache.  Takes excedrin , headache goes away. States the headaches ar not migraines.  Patient has a history of both and knows the difference.  Blood pressure was 165/90 before church, and  174/108 after church.  Denies runny nose, cough of fever.  Left arm aching earlier today.  No aching currently.  Patient does have a light headache right now.  Pain is center forehead and radiates to the right of face and head  Family had PE when she had covid

## 2024-10-20 NOTE — ED Provider Notes (Signed)
 [facility]    CSN: [id number] Arrival date & time: 10/20/24  1659      History   Chief Complaint No chief complaint on file.   HPI Kara Cannon is a 53 y.o. female.   Patient presents today with a 3-day history of intermittent headache.  She reports that the headache is localized to her right temporal region without radiation, described as aching, worse when she wakes up in the morning to the point that it has woken her up from sleep, improved with analgesics including Excedrin.  She does have a history of migraines and reports that current symptoms are not similar to previous episodes of this condition.  She denies any recent illness including cough or congestion.  She was sick several weeks ago but the symptoms have resolved and she is not experiencing any additional symptoms.  She denies any visual disturbance, photophobia, neck pain, rash, jaw claudication, nausea/vomiting, dysarthria, focal weakness, numbness or paresthesias in her extremities.  She denies any recent medication changes.  Denies any recent head trauma.  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  She denies any recent risk factors for VTE event including active malignancy, immobilization, surgical procedure, exogenous hormone use.  She denies any leg pain or swelling, chest pain, shortness of breath, palpitations.    Past Medical History:  Diagnosis Date   Abnormal ultrasound 03/02/2017   Formatting of this note might be different from the original. 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   Headache(784.0) 06/22/2009   HEMORRHOIDS, INTERNAL 06/23/2003   Hyperlipidemia    HYPERTENSION 06/22/2009   Hypochromic microcytic anemia 08/07/2021   IC (interstitial cystitis)    Impingement syndrome of right shoulder region 07/13/2020   Iron deficiency anemia due to chronic blood loss 04/28/2022   Keratosis pilaris 08/07/2021   Left lower quadrant pain 02/25/2023   Left wrist pain 01/13/2022   MIGRAINE HEADACHE 06/22/2009   Morbid obesity (HCC) 02/25/2023   OSA (obstructive sleep apnea) 03/29/2022   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   Other specified hypothyroidism 09/22/2014   Pain in joint of right shoulder 11/07/2019   Perennial allergic rhinitis with seasonal variation 09/07/2019   PLANTAR WART, LEFT 08/03/2009   Postmenopausal bleeding 02/25/2023   Premature ovarian failure    Radial styloid tenosynovitis of left hand 05/06/2022   Reactive airway disease without asthma 09/07/2019   Rectal bleeding 02/25/2023   Screening for malignant neoplasm of colon 02/25/2023   Tenosynovitis, wrist 05/06/2022   Type 2 diabetes mellitus with hyperglycemia (HCC) 04/05/2019   Unspecified vitamin D  deficiency 06/22/2009   Urinary frequency 06/22/2009   UTI'S, HX OF 06/22/2009   Vomiting without nausea 02/25/2023    Patient  Active Problem List   Diagnosis Date Noted   Muscle cramp 10/11/2024   Long-term current use of injectable noninsulin antidiabetic medication 10/11/2024   History of iron deficiency 10/10/2024   Allergic rhinitis due to pollen 07/24/2024   Obesity (BMI 30-39.9)  07/24/2024   Chronic bilateral low back pain without sciatica 06/04/2024   History of hypothyroidism 06/04/2024   Chronic urticaria 06/04/2024   Diverticulosis 06/04/2024   Type 2 diabetes mellitus without complication, without long-term current use of insulin (HCC) 02/25/2023   Radial styloid tenosynovitis (de quervain) 05/06/2022   OSA (obstructive sleep apnea) 03/29/2022   History of pulmonary embolism    Premature ovarian failure 09/20/2016   Mixed hyperlipidemia 04/05/2013   Generalized anxiety disorder 03/22/2013   Vitamin D  deficiency 06/22/2009   Migraine without aura 06/22/2009   Cardiac murmur 06/22/2009   Hypertension 06/22/2009   Hemorrhoids, internal 06/23/2003    Past Surgical History:  Procedure Laterality Date   CYSTOSCOPY  02/19/2009   Dr. Watt   FRACTURE SURGERY Left    left wrist   ROTATOR CUFF REPAIR Right     OB History     Gravida  1   Para      Term      Preterm      AB  1   Living  0      SAB      IAB      Ectopic      Multiple      Live Births               Home Medications    Prior to Admission medications   Medication Sig Start Date End Date Taking? Authorizing Provider  baclofen (LIORESAL) 10 MG tablet Take 1 tablet (10 mg total) by mouth at bedtime as needed for muscle spasms. 10/20/24  Yes Nykole Matos, Rocky POUR, PA-C  EPINEPHrine  0.3 mg/0.3 mL IJ SOAJ injection Inject 0.3 mg into the muscle as needed for anaphylaxis. 06/04/24   Cyndi Shaver, PA-C  ferrous sulfate  325 (65 FE) MG tablet Take 1 tablet (325 mg total) by mouth 2 (two) times daily with a meal. 10/10/24 11/09/24  McElwee, Tinnie LABOR, NP  fexofenadine (ALLEGRA ALLERGY) 180 MG tablet Take 180 mg by mouth daily. 05/01/23   [provider]  hydrOXYzine  (ATARAX ) 25 MG tablet Take 1 tablet (25 mg total) by mouth every 8 (eight) hours as needed. 10/10/24   McElwee, Lauren A, NP  lidocaine  (XYLOCAINE ) 2 % solution Gargle with 15 mL every 1-2 hours as needed for  sore throat. 10/10/24   McElwee, Lauren A, NP  meloxicam (MOBIC) 15 MG tablet Take 15 mg by mouth daily. with food Patient taking differently: Take 15 mg by mouth as needed. with food    [provider]  rosuvastatin  (CRESTOR ) 10 MG tablet Take 1 tablet (10 mg total) by mouth daily. 10/10/24   McElwee, Lauren A, NP  Semaglutide , 1 MG/DOSE, (OZEMPIC , 1 MG/DOSE,) 2 MG/1.5ML SOPN Inject 1 mg into the skin once a week. 10/10/24   McElwee, Lauren A, NP  telmisartan  (MICARDIS ) 80 MG tablet Take 1 tablet (80 mg total) by mouth daily. 10/10/24   McElwee, Lauren A, NP  traZODone  (DESYREL ) 50 MG tablet Take 1 tablet (50 mg total) by mouth at bedtime as needed. 10/10/24   McElwee, Lauren A, NP  triamcinolone cream (KENALOG) 0.1 % Apply 1 Application topically 2 (two) times daily.    [provider]  potassium chloride  (K-DUR) 10 MEQ  tablet Take 1 tablet (10 mEq total) by mouth 2 (two) times daily. Patient not taking: Reported on 12/17/2015 05/15/13 12/18/15  Georgian, Doe-Hyun R, DO    Family History Family History  Problem Relation Age of Onset   Hypertension Mother    Asthma Mother    Diabetes Mother    Hypertension Father    Cancer Father    Ovarian cancer Paternal Grandmother    Cancer Paternal Grandmother    Diabetes Maternal Grandmother    Hyperlipidemia Maternal Grandmother    Heart disease Maternal Grandfather    Skin cancer Maternal Uncle    Arthritis Maternal Aunt    Cancer Paternal Aunt    Cancer Paternal Aunt    Cancer Paternal Uncle    Cancer Paternal Aunt    Diabetes Maternal Uncle    Diabetes Maternal Aunt    Heart disease Paternal Aunt     Social History Social History   Tobacco Use   Smoking status: Never   Smokeless tobacco: Never   Tobacco comments:    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 Negative for appetite change, fatigue and fever.  HENT:  Negative for congestion and sore throat.   Eyes:  Negative for photophobia and visual disturbance.  Respiratory:  Negative for cough and shortness of breath.   Cardiovascular:  Negative for chest pain.  Gastrointestinal:  Negative for diarrhea, nausea and vomiting.  Musculoskeletal:  Negative for arthralgias, back pain, myalgias and neck pain.  Neurological:  Positive for headaches. Negative for dizziness, seizures, syncope, facial asymmetry, speech difficulty, weakness, light-headedness and numbness.     Physical Exam Triage Vital Signs ED Triage Vitals  Encounter Vitals Group     BP 10/20/24 1835 (!) 151/83     Girls Systolic BP Percentile --      Girls Diastolic BP Percentile --      Boys Systolic BP Percentile --      Boys Diastolic BP Percentile --      Pulse Rate 10/20/24 1835 70     Resp 10/20/24 1835 18     Temp 10/20/24 1835 99.2 F (37.3 C)     Temp Source 10/20/24 1835 Oral     SpO2 10/20/24 1835 97 %     Weight --      Height --      Head Circumference --      Peak Flow --      Pain Score 10/20/24 1831 3     Pain Loc --      Pain Education --      Exclude from Growth Chart --    No data found.  Updated Vital Signs BP (!) 140/74 (BP Location: Right Arm)   Pulse 70   Temp 99.2 F (37.3 C) (Oral)   Resp 18   SpO2 98%   Visual Acuity Right Eye Distance:   Left Eye Distance:   Bilateral Distance:    Right Eye Near:   Left Eye Near:    Bilateral Near:     Physical Exam Vitals reviewed.  Constitutional:      General: She is awake.  She is not in acute distress.    Appearance: Normal appearance. She is well-developed. She is not ill-appearing.     Comments: Very pleasant female appears stated age in no acute distress sitting comfortable  in exam room  HENT:     Head: Normocephalic and atraumatic. No raccoon eyes, Battle's sign or contusion.     Right Ear: Tympanic membrane, ear canal and external ear normal. No hemotympanum.     Left Ear: Tympanic membrane, ear canal and external ear normal. No hemotympanum.     Nose: Nose normal.     Mouth/Throat:     Tongue: Tongue does not deviate from midline.     Pharynx: Uvula midline. No oropharyngeal exudate or posterior oropharyngeal erythema.  Eyes:     Extraocular Movements: Extraocular movements intact.     Pupils: Pupils are equal, round, and reactive to light.  Cardiovascular:     Rate and Rhythm: Normal rate and regular rhythm.     Heart sounds: Normal heart sounds, S1 normal and S2 normal. No murmur heard. Pulmonary:     Effort: Pulmonary effort is normal.     Breath sounds: Normal breath sounds. No wheezing, rhonchi or rales.     Comments: Clear to auscultation bilaterally Musculoskeletal:     Cervical back: Normal range of motion and neck supple. No spinous process tenderness or muscular tenderness.     Comments: Strength 5/5 bilateral upper and lower extremities  Neurological:     General: No focal deficit present.     Mental Status: She is alert and oriented to person, place, and time.     Cranial Nerves: Cranial nerves 2-12 are intact.     Motor: Motor function is intact.     Coordination: Coordination is intact. Romberg sign negative. Rapid alternating movements normal.     Gait: Gait is intact.     Comments: Cranial nerves II through XII grossly intact.  No focal neurologic defect on exam.  Psychiatric:        Behavior: Behavior is cooperative.      UC Treatments / Results  Labs (all labs ordered are listed, but only abnormal results are displayed) Labs Reviewed - No data to display  EKG   Radiology No results found.  Procedures Procedures (including critical care time)  Medications Ordered in UC Medications  ketorolac  (TORADOL ) 30 MG/ML  injection 30 mg (30 mg Intramuscular Given 10/20/24 1929)    Initial Impression / Assessment and Plan / UC Course  I have reviewed the triage vital signs and the nursing notes.  Pertinent labs & imaging results that were available during my care of the patient were reviewed by me and considered in my medical decision making (see chart for details).     Patient is well-appearing, afebrile, nontoxic, nontachycardic.  Vital signs physical exam are reassuring with no indication for emergent evaluation or imaging.  Her blood pressure was slightly elevated but not concerning for hypertensive emergency and she reports only a mild headache.  She was given Toradol  in clinic with significant improvement of symptoms.  She does have a listed allergy to naproxen but has taken Toradol  earlier this year without difficulty and with adequate pain relief.  We discussed that she is not to take NSAIDs for the next 24 hours due to Toradol  use.  Indication for dose adjustment of her medications based on metabolic panel from 10/10/2024 with creatinine of 0.72 and calculated creatinine clearance of 131 mL/min.  She reports significant improvement in symptoms and  she does not have any SNOOP symptoms today though this is not similar to her previous episodes of migraine.  I recommended that she follow-up with neurology and was given the contact information for local provider with instruction to call to schedule an appointment.  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  She was given baclofen  for additional pain relief and we discussed that this should be used in place of the tizanidine  that she had on her prescription list.  Patient reports that she does not take that medication regularly.  We discussed that if anything worsens or changes she should have a very low threshold  for going to the ER.  Patient expressed understanding and agreement the treatment plan as did her husband who accompanied her to the visit.  Excuse note was provided.  Final Clinical Impressions(s) / UC Diagnoses   Final diagnoses:  Nonintractable headache, unspecified chronicity pattern, unspecified headache type     Discharge Instructions      Please do not take NSAIDs for the next 24 hours including aspirin, ibuprofen /Advil , naproxen/Aleve, meloxicam/Mobic since we gave her the Toradol  injection.  You can do Tylenol  for breakthrough pain.  I have called in baclofen  to help with your headaches.  This will make you sleepy so take it just at night do not drive or drink alcohol while taking it.  This should replace your tizanidine /Zanaflex  as these are similar medications and should not be combined.  I would like you to follow-up with a neurologist.  Call them to schedule an appointment.  As we discussed, if anything changes or worsens and you have severe headache, worst headache of your life, additional symptoms including vision change, difficulty speaking, weakness 1 part of your body, facial droop you need to go to the emergency room immediately.     ED Prescriptions     Medication Sig Dispense Auth. Provider   baclofen  (LIORESAL ) 10 MG tablet Take 1 tablet (10 mg total) by mouth at bedtime as needed for muscle spasms. 5 each Jauna Raczynski K, PA-C      PDMP not reviewed this encounter.   Sherrell Rocky POUR, PA-C 10/20/24 2020

## 2024-10-20 NOTE — Discharge Instructions (Signed)
 Please do not take NSAIDs for the next 24 hours including aspirin, ibuprofen /Advil , naproxen/Aleve, meloxicam/Mobic since we gave her the Toradol  injection.  You can do Tylenol  for breakthrough pain.  I have called in baclofen to help with your headaches.  This will make you sleepy so take it just at night do not drive or drink alcohol while taking it.  This should replace your tizanidine /Zanaflex  as these are similar medications and should not be combined.  I would like you to follow-up with a neurologist.  Call them to schedule an appointment.  As we discussed, if anything changes or worsens and you have severe headache, worst headache of your life, additional symptoms including vision change, difficulty speaking, weakness 1 part of your body, facial droop you need to go to the emergency room immediately.

## 2024-10-28 MED ORDER — EZETIMIBE 10 MG PO TABS: 10.0000 mg | ORAL_TABLET | Freq: Every day | ORAL | 0 refills | Status: AC

## 2024-11-05 ENCOUNTER — Other Ambulatory Visit: Payer: Self-pay | Admitting: Nurse Practitioner

## 2024-11-05 DIAGNOSIS — Z8639 Personal history of other endocrine, nutritional and metabolic disease: Secondary | ICD-10-CM

## 2024-11-05 NOTE — Telephone Encounter (Signed)
 Requesting: FeroSul 325 (65 Fe) MG Oral Tablet  Last Visit: 10/10/2024 Next Visit: 01/10/2025 Last Refill: 10/10/2024  Please Advise

## 2024-11-25 ENCOUNTER — Ambulatory Visit: Admitting: Nurse Practitioner

## 2024-11-25 ENCOUNTER — Encounter: Payer: Self-pay | Admitting: Nurse Practitioner

## 2024-11-25 VITALS — BP 132/88 | HR 80 | Temp 99.4°F | Ht 66.0 in | Wt 207.0 lb

## 2024-11-25 DIAGNOSIS — H6690 Otitis media, unspecified, unspecified ear: Secondary | ICD-10-CM

## 2024-11-25 DIAGNOSIS — J069 Acute upper respiratory infection, unspecified: Secondary | ICD-10-CM | POA: Diagnosis not present

## 2024-11-25 MED ORDER — AZITHROMYCIN 250 MG PO TABS: ORAL_TABLET | ORAL | 0 refills | Status: AC

## 2024-11-25 MED ORDER — ALBUTEROL SULFATE HFA 108 (90 BASE) MCG/ACT IN AERS: 2.0000 | INHALATION_SPRAY | Freq: Four times a day (QID) | RESPIRATORY_TRACT | 0 refills | Status: DC | PRN

## 2024-11-25 MED ORDER — PROMETHAZINE-DM 6.25-15 MG/5ML PO SYRP: 5.0000 mL | ORAL_SOLUTION | Freq: Four times a day (QID) | ORAL | 0 refills | Status: AC | PRN

## 2024-11-25 MED ORDER — PREDNISONE 20 MG PO TABS: 40.0000 mg | ORAL_TABLET | Freq: Every day | ORAL | 0 refills | Status: AC

## 2024-11-25 NOTE — Progress Notes (Signed)
 "  Acute Office Visit  Subjective:     Patient ID: Kara Cannon, female    DOB: [birth year], 54 y.o.   MRN: [id number]  Chief Complaint  Patient presents with   Cough    Dry cough for 5 days, congestion, pain in side from coughing, wheezing, SOB, chills    HPI Discussed the use of AI scribe software for clinical note transcription with the patient, who gave verbal consent to proceed.  History of Present Illness   Kara Cannon is a 54 year old female who presents with persistent cough, wheezing, and shortness of breath.  Her symptoms started on New Year's Day with throat discomfort and fatigue after church and have progressively worsened. Over the past two days she developed a severe, persistent cough that is worse at night, causes a dry burning throat, and has led to vomiting. She reports new wheezing and shortness of breath since last night, along with runny nose with clear to occasionally yellow discharge and a sore throat now described as dry and burning. She notes back pain and a sense of heaviness. She has tried Sudafed, Tylenol , Vicks, Nyquil, Mucinex , and cough pearls without relief and just started a nasal spray today. She notes a low-grade fever of 99.66F. She mentions asthma-like symptoms only during a prior COVID-19 infection and has an inhaler at home but is unsure if it is expired.      ROS See pertinent positives and negatives per HPI.     Objective:    BP 132/88 (BP Location: Left Arm, Cuff Size: Large)   Pulse 80   Temp 99.4 F (37.4 C) (Oral)   Ht 5' 6 (1.676 m)   Wt 207 lb (93.9 kg)   SpO2 99%   BMI 33.41 kg/m  BP Readings from Last 3 Encounters:  11/25/24 132/88  10/20/24 (!) 140/74  10/10/24 130/82   Wt Readings from Last 3 Encounters:  11/25/24 207 lb (93.9 kg)  10/10/24 207 lb 6.4 oz (94.1 kg)  07/24/24 204 lb 12.9 oz (92.9 kg)      Physical Exam Vitals and nursing note reviewed.  Constitutional:      General: She is not in acute  distress.    Appearance: Normal appearance.  HENT:     Head: Normocephalic.     Right Ear: Tympanic membrane, ear canal and external ear normal.     Left Ear: External ear normal. Tympanic membrane is erythematous and bulging.     Mouth/Throat:     Mouth: Mucous membranes are moist.     Pharynx: Posterior oropharyngeal erythema present. No oropharyngeal exudate.  Eyes:     Conjunctiva/sclera: Conjunctivae normal.  Cardiovascular:     Rate and Rhythm: Normal rate and regular rhythm.     Pulses: Normal pulses.     Heart sounds: Normal heart sounds.  Pulmonary:     Breath sounds: Normal breath sounds.     Comments: Slight shortness of breath with talking Musculoskeletal:     Cervical back: Normal range of motion and neck supple. No tenderness.  Lymphadenopathy:     Cervical: No cervical adenopathy.  Skin:    General: Skin is warm.  Neurological:     General: No focal deficit present.     Mental Status: She is alert and oriented to person, place, and time.  Psychiatric:        Mood and Affect: Mood normal.        Behavior: Behavior normal.  Thought Content: Thought content normal.        Judgment: Judgment normal.       Assessment & Plan:   Problem List Items Addressed This Visit   None Visit Diagnoses       Upper respiratory tract infection, unspecified type    -  Primary   Most likely viral. Encourage rest, fluids. Promethazine  q4hr prn cough. Will also start prednsione 40mg  daily x5 for wheezing. Work note given   Relevant Medications   azithromycin  (ZITHROMAX ) 250 MG tablet     Acute otitis media, unspecified otitis media type       Start zpak 2 tablets today, then 1 tablet daily until gone. Can take tylenol  as needed for pain.   Relevant Medications   azithromycin  (ZITHROMAX ) 250 MG tablet       Meds ordered this encounter  Medications   albuterol  (VENTOLIN  HFA) 108 (90 Base) MCG/ACT inhaler    Sig: Inhale 2 puffs into the lungs every 6 (six) hours as  needed for wheezing or shortness of breath.    Dispense:  18 g    Refill:  0   promethazine -dextromethorphan  (PROMETHAZINE -DM) 6.25-15 MG/5ML syrup    Sig: Take 5 mLs by mouth 4 (four) times daily as needed.    Dispense:  118 mL    Refill:  0   azithromycin  (ZITHROMAX ) 250 MG tablet    Sig: Take 2 tablets on day 1, then 1 tablet daily on days 2 through 5    Dispense:  6 tablet    Refill:  0   predniSONE  (DELTASONE ) 20 MG tablet    Sig: Take 2 tablets (40 mg total) by mouth daily with breakfast.    Dispense:  10 tablet    Refill:  0    Return if symptoms worsen or fail to improve.  Tinnie DELENA Harada, NP   "

## 2024-11-25 NOTE — Patient Instructions (Signed)
 It was great to see you!  Start cough syrup every 4 hours as needed for cough   Keep drinking lots of fluids and get rest  Start prednsione 2 tablets once a day with food in the morning  Start azithromycin  2 tablets today, then 1 tablet daily   I have refilled your inhaler  Let's follow-up if your symptoms worsen or don't improve   Take care,  Tinnie Harada, NP

## 2024-12-02 ENCOUNTER — Encounter: Payer: Self-pay | Admitting: Nurse Practitioner

## 2024-12-03 MED ORDER — AMOXICILLIN-POT CLAVULANATE 875-125 MG PO TABS: 1.0000 | ORAL_TABLET | Freq: Two times a day (BID) | ORAL | 0 refills | Status: AC

## 2024-12-05 ENCOUNTER — Ambulatory Visit: Admitting: Nurse Practitioner

## 2024-12-13 ENCOUNTER — Ambulatory Visit: Payer: Self-pay | Admitting: Family Medicine

## 2024-12-14 ENCOUNTER — Other Ambulatory Visit: Payer: Self-pay | Admitting: Nurse Practitioner

## 2025-01-10 ENCOUNTER — Ambulatory Visit: Admitting: Nurse Practitioner
